# Patient Record
Sex: Male | Born: 1965 | Race: White | Hispanic: No | Marital: Married | Smoking: Never smoker
Health system: Southern US, Community
[De-identification: ages and names within clinical notes are randomized; demographics above are authoritative.]

## PROBLEM LIST (undated history)

## (undated) DIAGNOSIS — M1990 Unspecified osteoarthritis, unspecified site: Secondary | ICD-10-CM

## (undated) DIAGNOSIS — F32A Depression, unspecified: Secondary | ICD-10-CM

## (undated) DIAGNOSIS — N400 Enlarged prostate without lower urinary tract symptoms: Secondary | ICD-10-CM

## (undated) DIAGNOSIS — E785 Hyperlipidemia, unspecified: Secondary | ICD-10-CM

## (undated) DIAGNOSIS — F419 Anxiety disorder, unspecified: Secondary | ICD-10-CM

## (undated) DIAGNOSIS — E669 Obesity, unspecified: Secondary | ICD-10-CM

## (undated) DIAGNOSIS — G4730 Sleep apnea, unspecified: Secondary | ICD-10-CM

## (undated) DIAGNOSIS — E119 Type 2 diabetes mellitus without complications: Secondary | ICD-10-CM

## (undated) DIAGNOSIS — R011 Cardiac murmur, unspecified: Secondary | ICD-10-CM

## (undated) DIAGNOSIS — T884XXA Failed or difficult intubation, initial encounter: Secondary | ICD-10-CM

## (undated) DIAGNOSIS — E291 Testicular hypofunction: Secondary | ICD-10-CM

## (undated) DIAGNOSIS — R0789 Other chest pain: Secondary | ICD-10-CM

## (undated) DIAGNOSIS — F329 Major depressive disorder, single episode, unspecified: Secondary | ICD-10-CM

## (undated) DIAGNOSIS — I10 Essential (primary) hypertension: Secondary | ICD-10-CM

## (undated) DIAGNOSIS — G4700 Insomnia, unspecified: Secondary | ICD-10-CM

## (undated) HISTORY — DX: Insomnia, unspecified: G47.00

## (undated) HISTORY — DX: Type 2 diabetes mellitus without complications: E11.9

## (undated) HISTORY — DX: Testicular hypofunction: E29.1

## (undated) HISTORY — DX: Obesity, unspecified: E66.9

## (undated) HISTORY — DX: Benign prostatic hyperplasia without lower urinary tract symptoms: N40.0

## (undated) HISTORY — DX: Sleep apnea, unspecified: G47.30

## (undated) HISTORY — DX: Essential (primary) hypertension: I10

## (undated) HISTORY — DX: Other chest pain: R07.89

---

## 2000-04-08 HISTORY — PX: CHOLECYSTECTOMY: SHX55

## 2003-04-09 HISTORY — PX: OTHER SURGICAL HISTORY: SHX169

## 2003-07-08 HISTORY — PX: TONSILLECTOMY: SUR1361

## 2004-04-08 HISTORY — PX: APPENDECTOMY: SHX54

## 2004-05-25 ENCOUNTER — Inpatient Hospital Stay: Payer: Self-pay | Admitting: Surgery

## 2004-06-06 ENCOUNTER — Ambulatory Visit: Payer: Self-pay | Admitting: Surgery

## 2011-01-01 ENCOUNTER — Ambulatory Visit: Payer: Self-pay | Admitting: Unknown Physician Specialty

## 2011-01-07 ENCOUNTER — Ambulatory Visit: Payer: Self-pay | Admitting: Unknown Physician Specialty

## 2011-02-07 ENCOUNTER — Ambulatory Visit: Payer: Self-pay | Admitting: Unknown Physician Specialty

## 2011-03-09 ENCOUNTER — Ambulatory Visit: Payer: Self-pay | Admitting: Unknown Physician Specialty

## 2013-06-14 ENCOUNTER — Ambulatory Visit: Payer: Self-pay | Admitting: Family Medicine

## 2013-07-07 ENCOUNTER — Ambulatory Visit: Payer: Self-pay | Admitting: Family Medicine

## 2014-05-17 ENCOUNTER — Emergency Department: Payer: Self-pay | Admitting: Emergency Medicine

## 2014-12-05 ENCOUNTER — Encounter: Payer: Self-pay | Admitting: *Deleted

## 2014-12-14 ENCOUNTER — Ambulatory Visit: Payer: Self-pay | Admitting: Urology

## 2014-12-19 ENCOUNTER — Ambulatory Visit: Payer: Self-pay | Admitting: Urology

## 2014-12-26 ENCOUNTER — Ambulatory Visit (INDEPENDENT_AMBULATORY_CARE_PROVIDER_SITE_OTHER): Payer: BC Managed Care – PPO | Admitting: Urology

## 2014-12-26 ENCOUNTER — Encounter: Payer: Self-pay | Admitting: Urology

## 2014-12-26 VITALS — BP 137/81 | HR 98 | Resp 16 | Ht 69.0 in | Wt 221.5 lb

## 2014-12-26 DIAGNOSIS — N528 Other male erectile dysfunction: Secondary | ICD-10-CM | POA: Diagnosis not present

## 2014-12-26 DIAGNOSIS — N401 Enlarged prostate with lower urinary tract symptoms: Secondary | ICD-10-CM | POA: Diagnosis not present

## 2014-12-26 DIAGNOSIS — N138 Other obstructive and reflux uropathy: Secondary | ICD-10-CM

## 2014-12-26 DIAGNOSIS — N529 Male erectile dysfunction, unspecified: Secondary | ICD-10-CM

## 2014-12-26 DIAGNOSIS — E291 Testicular hypofunction: Secondary | ICD-10-CM | POA: Diagnosis not present

## 2014-12-26 MED ORDER — TESTOSTERONE CYPIONATE 200 MG/ML IM SOLN: 200.0000 mg | INTRAMUSCULAR | Status: DC

## 2014-12-26 NOTE — Progress Notes (Signed)
12/26/2014 8:58 AM   Micheal Silva 1966-03-15 [id number]  Referring provider: No referring provider defined for this encounter.  Chief Complaint  Patient presents with  . Benign Prostatic Hypertrophy    HPI: Patient is a 49 year old white male with BPH with LUTS and hypogonadism who presents today for his 6 month follow up.    BPH WITH LUTS His IPSS score today is 3, which is mild lower urinary tract symptomatology. He is pleased with his quality life due to his urinary symptoms. He denies any dysuria, hematuria or suprapubic pain.  He also denies any recent fevers, chills, nausea or vomiting.  He has a family history of PCa, with his father being diagnosed with prostate cancer.         IPSS      12/26/14 1500       International Prostate Symptom Score   How often have you had the sensation of not emptying your bladder? Not at All     How often have you had to urinate less than every two hours? Less than 1 in 5 times     How often have you found you stopped and started again several times when you urinated? Not at All     How often have you found it difficult to postpone urination? Less than 1 in 5 times     How often have you had a weak urinary stream? Not at All     How often have you had to strain to start urination? Not at All     How many times did you typically get up at night to urinate? 1 Time     Total IPSS Score 3     Quality of Life due to urinary symptoms   If you were to spend the rest of your life with your urinary condition just the way it is now how would you feel about that? Pleased        Score:  1-7 Mild 8-19 Moderate 20-35 Severe  Hypogonadism Patient complains of reduced libido, erectile dysfunction, an increase of body fat, a decrease in physical and work performance, decreased energy and motivation and mood changes.  This is indicated by his responses to the ADAM questionnaire.      Androgen Deficiency in the Aging Male      12/26/14 1500       Androgen Deficiency in the Aging Male   Do you have a decrease in libido (sex drive) Yes     Do you have lack of energy Yes     Do you have a decrease in strength and/or endurance Yes     Have you lost height No     Have you noticed a decreased "enjoyment of life" No     Are you sad and/or grumpy No     Are your erections less strong Yes     Have you noticed a recent deterioration in your ability to play sports Yes     Are you falling asleep after dinner No     Has there been a recent deterioration in your work performance No         His pretreatment testosterone level was 251 ng/dL on 07/31/2011.   * patient notified me that he has completed a residual treatment program for drug abuse and wanted all his providers to know in order to help him control his addiction and not give him pain meds.   PMH: Past Medical History  Diagnosis Date  . Sleep apnea   . Insomnia   . Diabetes mellitus   . Hypogonadism in male   . BPH (benign prostatic hyperplasia)   . HTN (hypertension)   . Obesity     Surgical History: Past Surgical History  Procedure Laterality Date  . Appendectomy  2006  . Corrective surgery for sleep apnea  2005  . Cholecystectomy      Home Medications:    Medication List       This list is accurate as of: 12/26/14 11:59 PM.  Always use your most recent med list.               aspirin 81 MG chewable tablet  Chew by mouth.     buPROPion 150 MG 24 hr tablet  Commonly known as:  WELLBUTRIN XL  TAKE 1 TABLET (150 MG TOTAL) BY MOUTH 2 (TWO) TIMES DAILY.     FLONASE 50 MCG/ACT nasal spray  Generic drug:  fluticasone  1 spray by Each Nare route daily.     losartan 50 MG tablet  Commonly known as:  COZAAR  Take 100 mg by mouth daily.     metFORMIN 1000 MG tablet  Commonly known as:  GLUCOPHAGE  TAKE 1 TABLET (1,000 MG TOTAL) BY MOUTH 2 (TWO) TIMES DAILY WITH MEALS.     naltrexone 50 MG tablet  Commonly known as:  DEPADE  Take by mouth.      saccharomyces boulardii 250 MG capsule  Commonly known as:  FLORASTOR  Take by mouth.     testosterone cypionate 200 MG/ML injection  Commonly known as:  DEPOTESTOSTERONE CYPIONATE  Inject 1 mL (200 mg total) into the muscle every 14 (fourteen) days.        Allergies:  Allergies  Allergen Reactions  . Vicodin [Hydrocodone-Acetaminophen]   . Atorvastatin     Other reaction(s): Other (See Comments) Leg cramp    Family History: Family History  Problem Relation Age of Onset  . Prostate cancer Father   . Diabetes Mellitus II    . Coronary artery disease    . Hypertension      Social History:  reports that he has never smoked. He does not have any smokeless tobacco history on file. He reports that he does not drink alcohol. His drug history is not on file.  ROS: UROLOGY Frequent Urination?: No Hard to postpone urination?: No Burning/pain with urination?: No Get up at night to urinate?: No Leakage of urine?: No Urine stream starts and stops?: No Trouble starting stream?: No Do you have to strain to urinate?: No Blood in urine?: No Urinary tract infection?: No Sexually transmitted disease?: No Injury to kidneys or bladder?: No Painful intercourse?: No Weak stream?: No Erection problems?: No Penile pain?: No  Gastrointestinal Nausea?: No Vomiting?: No Indigestion/heartburn?: No Diarrhea?: No Constipation?: No  Constitutional Fever: No Night sweats?: No Weight loss?: No Fatigue?: No  Skin Skin rash/lesions?: No Itching?: No  Eyes Blurred vision?: No Double vision?: No  Ears/Nose/Throat Sore throat?: No Sinus problems?: No  Hematologic/Lymphatic Swollen glands?: No Easy bruising?: No  Cardiovascular Leg swelling?: No Chest pain?: No  Respiratory Cough?: No Shortness of breath?: No  Endocrine Excessive thirst?: No  Musculoskeletal Back pain?: No Joint pain?: No  Neurological Headaches?: No Dizziness?: No  Psychologic Depression?:  No Anxiety?: No  Physical Exam: BP 137/81 mmHg  Pulse 98  Resp 16  Ht 5\' 9"  (1.753 m)  Wt 221 lb 8 oz (100.472 kg)  BMI 32.69 kg/m2  GU: Patient with circumcised phallus.  Urethral meatus is patent.  No penile discharge. No penile lesions or rashes. Scrotum without lesions, cysts, rashes and/or edema.  Testicles are located scrotally bilaterally. No masses are appreciated in the testicles. Left and right epididymis are normal. Rectal: Patient with  normal sphincter tone. Perineum without scarring or rashes. No rectal masses are appreciated. Prostate is approximately 45 grams, no nodules are appreciated. Seminal vesicles are normal.   Laboratory Data: Lab Results  Component Value Date   HCT 52.6* 12/26/2014   PSA History:  0.7 ng/mL on 07/20/2012  0.8 ng/mL on 05/31/2013  0.5 ng/mL on 11/24/2013  0.9 ng/mL on 05/27/2014  Lab Results  Component Value Date   PSA 0.8 12/26/2014    Assessment & Plan:    1. BPH (benign prostatic hyperplasia) with LUTS:   Patient's IPSS score is 3/1.   His DRE demonstrates benign enlargement.  We will continue observation over time with prn avoidance of alcohol/caffeine.  He will follow up in 6 months for a PSA, DRE, PVR and an IPSS.    - PSA  2. Hypogonadism:   Patient is currently managing his hypogonadism with depot testosterone cypionate injections, 200 mg/mL, 1cc IM weekly.  He will continue this dose until his am testosterone is available.  Once his lab result is available, we will adjust accordingly.  He has been without his medication for one month.   -Hematocrit  3. Erectile dysfunction:   Patient is having difficulty maintaining his erections.  He would like to try medication for this problem, which I think is appropriate.  I have given him Cialis 20 mg (#3) samples.  He will contact the office with the results.  He is warned not to take the Cialis with medications that contain nitrates.  I also advised him of the side effects, such as:  headache, flushing, dyspepsia, abnormal vision, nasal congestion, back pain, myalgia, nausea, dizziness, and rash.    Return for tomorrow in the am for testosterone draw .  Zara Council, [facility] [address] [employer] [place], [place] [address] [phone]

## 2014-12-27 ENCOUNTER — Other Ambulatory Visit: Payer: BC Managed Care – PPO

## 2014-12-27 DIAGNOSIS — N401 Enlarged prostate with lower urinary tract symptoms: Principal | ICD-10-CM

## 2014-12-27 DIAGNOSIS — E291 Testicular hypofunction: Secondary | ICD-10-CM | POA: Insufficient documentation

## 2014-12-27 DIAGNOSIS — N138 Other obstructive and reflux uropathy: Secondary | ICD-10-CM | POA: Insufficient documentation

## 2014-12-27 DIAGNOSIS — N529 Male erectile dysfunction, unspecified: Secondary | ICD-10-CM | POA: Insufficient documentation

## 2014-12-27 LAB — PSA: PROSTATE SPECIFIC AG, SERUM: 0.8 ng/mL (ref 0.0–4.0)

## 2014-12-27 LAB — HEMATOCRIT: Hematocrit: 52.6 % — ABNORMAL HIGH (ref 37.5–51.0)

## 2014-12-27 NOTE — Progress Notes (Signed)
Patient had testosterone drawn this morning that the order was attached to yesterdays visit.

## 2014-12-28 ENCOUNTER — Telehealth: Payer: Self-pay

## 2014-12-28 LAB — TESTOSTERONE: TESTOSTERONE: 57 ng/dL — AB (ref 348–1197)

## 2014-12-28 NOTE — Telephone Encounter (Signed)
-----   Message from Nori Riis, PA-C sent at 12/28/2014  9:03 AM EDT ----- Patient's testosterone is low, but he has been without his testosterone for one month.  His HCT is elevated, so he will have to hold the testosterone and have therapeutic phlebotomy.

## 2014-12-28 NOTE — Telephone Encounter (Signed)
LMOM

## 2014-12-29 NOTE — Telephone Encounter (Signed)
Spoke with pt in reference to labs. Made pt aware of HCT levels and made aware for the need of therapeutic phlebotomy. Pt stated he would see about a blood drive in [facility]. Reinforced the need for therapeutic phlebotomy. Pt voiced understanding stating he would call back if he can not find a blood drive.

## 2014-12-29 NOTE — Telephone Encounter (Signed)
-----   Message from Nori Riis, PA-C sent at 12/28/2014  9:03 AM EDT ----- Patient's testosterone is low, but he has been without his testosterone for one month.  His HCT is elevated, so he will have to hold the testosterone and have therapeutic phlebotomy.

## 2014-12-29 NOTE — Telephone Encounter (Signed)
LMOM

## 2015-01-02 ENCOUNTER — Telehealth: Payer: Self-pay

## 2015-01-02 NOTE — Telephone Encounter (Signed)
Pt called this morning stating he went Friday and gave blood. Pt stated he has to go see his PCP tomorrow and have blood work. Pt requested PCP to draw labs. Nurse reassured pt that would be ok but the results would need to be faxed to our office. Pt voiced understanding. Fax number was given. Reinforced with pt not to take anymore testosterone injections until we get hct and testosterone results due to elevated hct. Pt voiced understanding.  Larene Beach has been made aware of labs being drawn at PCP.

## 2015-03-27 ENCOUNTER — Telehealth: Payer: Self-pay

## 2015-03-27 DIAGNOSIS — E291 Testicular hypofunction: Secondary | ICD-10-CM

## 2015-03-27 NOTE — Telephone Encounter (Signed)
Pt called wanting refills on testosterone. Made pt aware he is going to need labs prior to receiving refills. Orders placed.

## 2015-03-29 ENCOUNTER — Encounter: Payer: Self-pay | Admitting: Family Medicine

## 2015-04-04 ENCOUNTER — Other Ambulatory Visit: Payer: BC Managed Care – PPO

## 2015-04-04 DIAGNOSIS — E291 Testicular hypofunction: Secondary | ICD-10-CM

## 2015-04-05 LAB — HEMATOCRIT: HEMATOCRIT: 47.1 % (ref 37.5–51.0)

## 2015-04-05 LAB — TESTOSTERONE: Testosterone: 70 ng/dL — ABNORMAL LOW (ref 348–1197)

## 2015-04-11 ENCOUNTER — Telehealth: Payer: Self-pay | Admitting: Urology

## 2015-04-11 DIAGNOSIS — E291 Testicular hypofunction: Secondary | ICD-10-CM

## 2015-04-11 NOTE — Telephone Encounter (Signed)
As of the first of the year, we are administering testosterone injections in the office and no longer having patient's inject themselves.  This is due to the increased FDA warnings concerning the potential for abuse of testosterone and the increase of potential heart attacks.  If he would like to reinstate the injections, he will need to do so through our office. If he finds this convenient, we can try other treatment modalities such as Clomid, topical applications of testosterone, nasal spray of testosterone, or the placement of testosterone pellets. He will need to have liver function tests performed prior to the initiation of any testosterone therapy.

## 2015-04-11 NOTE — Telephone Encounter (Signed)
Please advise 

## 2015-04-11 NOTE — Telephone Encounter (Signed)
Pt called for his lab results.  Please give him a call on cell.

## 2015-04-12 ENCOUNTER — Other Ambulatory Visit: Payer: Self-pay | Admitting: Urology

## 2015-04-12 DIAGNOSIS — Z79899 Other long term (current) drug therapy: Secondary | ICD-10-CM

## 2015-04-13 ENCOUNTER — Telehealth: Payer: Self-pay

## 2015-04-13 ENCOUNTER — Other Ambulatory Visit: Payer: BC Managed Care – PPO

## 2015-04-13 DIAGNOSIS — Z79899 Other long term (current) drug therapy: Secondary | ICD-10-CM

## 2015-04-13 NOTE — Telephone Encounter (Signed)
-----   Message from Nori Riis, PA-C sent at 04/12/2015 11:43 AM EST ----- Spoke with patient concerning his labs.  He is interested in starting Clomid.  He will have his PCP fax over his LFT's.

## 2015-04-13 NOTE — Telephone Encounter (Signed)
LMOM

## 2015-04-13 NOTE — Telephone Encounter (Signed)
Pt came in this morning and had LFTs drawn.

## 2015-04-14 ENCOUNTER — Telehealth: Payer: Self-pay

## 2015-04-14 ENCOUNTER — Other Ambulatory Visit: Payer: Self-pay | Admitting: Urology

## 2015-04-14 LAB — HEPATIC FUNCTION PANEL
ALBUMIN: 4.7 g/dL (ref 3.5–5.5)
ALK PHOS: 63 IU/L (ref 39–117)
ALT: 38 IU/L (ref 0–44)
AST: 26 IU/L (ref 0–40)
BILIRUBIN TOTAL: 0.3 mg/dL (ref 0.0–1.2)
BILIRUBIN, DIRECT: 0.09 mg/dL (ref 0.00–0.40)
TOTAL PROTEIN: 7.1 g/dL (ref 6.0–8.5)

## 2015-04-14 MED ORDER — CLOMIPHENE CITRATE 50 MG PO TABS: ORAL_TABLET | ORAL | Status: DC

## 2015-04-14 NOTE — Telephone Encounter (Signed)
-----   Message from Nori Riis, PA-C sent at 04/14/2015  8:23 AM EST ----- Please tell the patient that his liver function is normal and I have sent a prescription for the Clomid to his pharmacy. We will need him to come back in one month before 9 AM for a serum testosterone draw.

## 2015-04-14 NOTE — Telephone Encounter (Signed)
No answer

## 2015-04-14 NOTE — Telephone Encounter (Signed)
LMOM-labs normal. Medication sent to pharmacy. PA has been started for clomid.

## 2015-04-19 NOTE — Telephone Encounter (Signed)
Spoke with pt who stated he has already picked up clomid from pharmacy. Made pt aware we need a testosterone in 1 mo. Pt voiced understanding. Lab appt made and lab orders placed.

## 2015-04-20 ENCOUNTER — Telehealth: Payer: Self-pay

## 2015-04-20 NOTE — Telephone Encounter (Signed)
Pt PA for clomid has been approved for 04/14/15-04/23/16.

## 2015-05-09 ENCOUNTER — Telehealth: Payer: Self-pay | Admitting: Urology

## 2015-05-09 DIAGNOSIS — E291 Testicular hypofunction: Secondary | ICD-10-CM

## 2015-05-09 NOTE — Telephone Encounter (Signed)
Patient wants a return call back regarding his options for meds. He said he is very hard to get along with in so many words and that he really needs to know what else he can do??  He really needs to speak with someone as soon as he can.  Thanks  [employer]

## 2015-05-09 NOTE — Telephone Encounter (Signed)
My understanding is that he is taking the Clomid and he should of had a morning testosterone draw after a month of treatment.  I did not see that he returned for the blood work.  Am I correct?  If he has been taking the Clomid and hasn't had his blood work, I need a morning testosterone level.

## 2015-05-10 NOTE — Telephone Encounter (Signed)
LMOM

## 2015-05-11 NOTE — Telephone Encounter (Signed)
We can call in Androderm for the patient 4 gram/24 patch, #30 and check his testosterone in one month.

## 2015-05-11 NOTE — Telephone Encounter (Signed)
Pt returned call. Spoke with pt in reference to clomid. Pt states he is not able to tolerated clomid. Pt was interested in androderm. Please advise.

## 2015-05-11 NOTE — Telephone Encounter (Signed)
LMOM

## 2015-05-12 ENCOUNTER — Telehealth: Payer: Self-pay

## 2015-05-12 MED ORDER — TESTOSTERONE 4 MG/24HR TD PT24: 1.0000 | MEDICATED_PATCH | Freq: Every day | TRANSDERMAL | Status: DC

## 2015-05-12 NOTE — Telephone Encounter (Signed)
PA for androderm was approved. 05/12/15-05/11/2018. Ref#- [id number].

## 2015-05-12 NOTE — Telephone Encounter (Signed)
Spoke with pt in reference to androderm. Made aware a testosterone needs to be drawn in 50mo. Pt voiced understanding. Labs placed. Lab appt 06/09/15.

## 2015-05-19 ENCOUNTER — Other Ambulatory Visit: Payer: Self-pay

## 2015-06-09 ENCOUNTER — Other Ambulatory Visit: Payer: BC Managed Care – PPO

## 2015-06-09 DIAGNOSIS — E291 Testicular hypofunction: Secondary | ICD-10-CM

## 2015-06-10 LAB — TESTOSTERONE: TESTOSTERONE: 336 ng/dL — AB (ref 348–1197)

## 2015-06-12 ENCOUNTER — Telehealth: Payer: Self-pay

## 2015-06-12 DIAGNOSIS — E291 Testicular hypofunction: Secondary | ICD-10-CM

## 2015-06-12 NOTE — Telephone Encounter (Signed)
His testosterone is 336 which is almost within normal range. When Micheal Silva gets back she can discuss possible dosage adjustments with him.

## 2015-06-12 NOTE — Telephone Encounter (Signed)
Pt called requesting lab results. Please advise.  

## 2015-06-13 MED ORDER — CLOMIPHENE CITRATE 50 MG PO TABS: ORAL_TABLET | ORAL | Status: DC

## 2015-06-13 NOTE — Telephone Encounter (Signed)
Spoke with pt in reference to lab results. Pt voiced understanding.  

## 2015-06-15 ENCOUNTER — Other Ambulatory Visit: Payer: Self-pay

## 2015-06-15 DIAGNOSIS — E291 Testicular hypofunction: Secondary | ICD-10-CM

## 2015-06-15 MED ORDER — TESTOSTERONE 4 MG/24HR TD PT24: 1.0000 | MEDICATED_PATCH | Freq: Every day | TRANSDERMAL | Status: DC

## 2015-06-20 ENCOUNTER — Telehealth: Payer: Self-pay

## 2015-06-20 NOTE — Telephone Encounter (Signed)
-----   Message from Nori Riis, PA-C sent at 06/20/2015  1:44 PM EDT ----- I haven't seen the patient since September.  He needs an appointment.

## 2015-06-20 NOTE — Telephone Encounter (Signed)
LMOM- needs an office visit. Call back for appt.

## 2015-06-23 NOTE — Telephone Encounter (Signed)
LMOM

## 2015-06-26 NOTE — Telephone Encounter (Signed)
Left pt mess to call/SW 

## 2015-06-26 NOTE — Telephone Encounter (Signed)
-----   Message from Nori Riis, PA-C sent at 06/20/2015  1:44 PM EDT ----- I haven't seen the patient since September.  He needs an appointment.

## 2015-11-24 ENCOUNTER — Other Ambulatory Visit: Payer: Self-pay | Admitting: Family Medicine

## 2015-11-24 DIAGNOSIS — R1084 Generalized abdominal pain: Secondary | ICD-10-CM

## 2015-11-27 ENCOUNTER — Ambulatory Visit: Payer: BC Managed Care – PPO

## 2015-11-28 ENCOUNTER — Ambulatory Visit
Admission: RE | Admit: 2015-11-28 | Discharge: 2015-11-28 | Disposition: A | Discharge status: Home / Self Care | Payer: BC Managed Care – PPO | Source: Ambulatory Visit | Attending: Family Medicine | Admitting: Family Medicine

## 2015-11-28 DIAGNOSIS — N281 Cyst of kidney, acquired: Secondary | ICD-10-CM | POA: Insufficient documentation

## 2015-11-28 DIAGNOSIS — R1084 Generalized abdominal pain: Secondary | ICD-10-CM | POA: Diagnosis present

## 2015-12-25 ENCOUNTER — Encounter: Payer: Self-pay | Admitting: General Surgery

## 2015-12-25 ENCOUNTER — Ambulatory Visit (INDEPENDENT_AMBULATORY_CARE_PROVIDER_SITE_OTHER): Payer: BC Managed Care – PPO | Admitting: General Surgery

## 2015-12-25 VITALS — BP 136/74 | HR 89 | Resp 14 | Ht 66.0 in | Wt 217.0 lb

## 2015-12-25 DIAGNOSIS — M546 Pain in thoracic spine: Secondary | ICD-10-CM | POA: Diagnosis not present

## 2015-12-25 DIAGNOSIS — R0789 Other chest pain: Secondary | ICD-10-CM

## 2015-12-25 DIAGNOSIS — R1011 Right upper quadrant pain: Secondary | ICD-10-CM

## 2015-12-25 DIAGNOSIS — R1010 Upper abdominal pain, unspecified: Secondary | ICD-10-CM

## 2015-12-25 DIAGNOSIS — G8929 Other chronic pain: Secondary | ICD-10-CM | POA: Insufficient documentation

## 2015-12-25 HISTORY — DX: Other chest pain: R07.89

## 2015-12-25 MED ORDER — POLYETHYLENE GLYCOL 3350 17 GM/SCOOP PO POWD: 1.0000 | Freq: Once | ORAL | 0 refills | Status: AC

## 2015-12-25 NOTE — Patient Instructions (Addendum)
Colonoscopy A colonoscopy is an exam to look at the entire large intestine (colon). This exam can help find problems such as tumors, polyps, inflammation, and areas of bleeding. The exam takes about 1 hour.  LET [facility] CARE PROVIDER KNOW ABOUT:   Any allergies you have.  All medicines you are taking, including vitamins, herbs, eye drops, creams, and over-the-counter medicines.  Previous problems you or members of your family have had with the use of anesthetics.  Any blood disorders you have.  Previous surgeries you have had.  Medical conditions you have. RISKS AND COMPLICATIONS  Generally, this is a safe procedure. However, as with any procedure, complications can occur. Possible complications include:  Bleeding.  Tearing or rupture of the colon wall.  Reaction to medicines given during the exam.  Infection (rare). BEFORE THE PROCEDURE   Ask your health care provider about changing or stopping your regular medicines.  You may be prescribed an oral bowel prep. This involves drinking a large amount of medicated liquid, starting the day before your procedure. The liquid will cause you to have multiple loose stools until your stool is almost clear or light green. This cleans out your colon in preparation for the procedure.  Do not eat or drink anything else once you have started the bowel prep, unless your health care provider tells you it is safe to do so.  Arrange for someone to drive you home after the procedure. PROCEDURE   You will be given medicine to help you relax (sedative).  You will lie on your side with your knees bent.  A long, flexible tube with a light and camera on the end (colonoscope) will be inserted through the rectum and into the colon. The camera sends video back to a computer screen as it moves through the colon. The colonoscope also releases carbon dioxide gas to inflate the colon. This helps your health care provider see the area better.  During  the exam, your health care provider may take a small tissue sample (biopsy) to be examined under a microscope if any abnormalities are found.  The exam is finished when the entire colon has been viewed. AFTER THE PROCEDURE   Do not drive for 24 hours after the exam.  You may have a small amount of blood in your stool.  You may pass moderate amounts of gas and have mild abdominal cramping or bloating. This is caused by the gas used to inflate your colon during the exam.  Ask when your test results will be ready and how you will get your results. Make sure you get your test results.   This information is not intended to replace advice given to you by your health care provider. Make sure you discuss any questions you have with your health care provider.   Document Released: 03/22/2000 Document Revised: 01/13/2013 Document Reviewed: 11/30/2012 Elsevier Interactive Patient Education 2016 [place].   Scheduled for MRI  The patient is scheduled for a Colonoscopy and upper endoscopy at [facility] on 01/01/16. They are aware to call the day before to get their arrival time. He will hold his Metformin the day of prep and procedure. The patient will only take his blood pressure medications the morning of procedure with a small sip of water. Miralax prescription has been sent into the patient's pharmacy. The patient is aware of date and instructions.  The patient is scheduled for a Thoracic MRI at [facility] on 12/29/15. He will arrive by 9:30 am at the registration  desk at the [facility]. The patient is aware of date, time, and instructions.

## 2015-12-25 NOTE — Progress Notes (Signed)
Patient ID: Micheal Silva, male   DOB: 01-04-1966, 50 y.o.   MRN: [id number]  Chief Complaint  Patient presents with  . Abdominal Pain    HPI Micheal Silva is a 50 y.o. male here today for a evaluation of abdominal pain. He states the pain is located in his upper right abdomen the pain extends to his back.   The pain has been going for about 3 months. He had a ultrasound done on 11/28/15 and a CT done on 12/13/15 at [facility]. Patient states he has been having a hard time sleeping and eating. No foods trigger the pain. He can tolerate bland foods and crackers. He has been having a lot of loose stools 2 or 3 times a day, no change in colorFor the last 2 weeks. Change in position make things worst. He does have nausea constantly throughout the day.He has lost 15 pounds in the last two months.  HPI  Past Medical History:  Diagnosis Date  . BPH (benign prostatic hyperplasia)   . Diabetes mellitus ([place])   . HTN (hypertension)   . Hypogonadism in male   . Insomnia   . Obesity   . Right-sided chest wall pain 12/25/2015  . Sleep apnea     Past Surgical History:  Procedure Laterality Date  . APPENDECTOMY  2006   Dr. Tamala Julian  . CHOLECYSTECTOMY  2002   [facility]-Dr. Tamala Julian  . corrective surgery for sleep apnea  2005  . TONSILLECTOMY  07/2003   palate also removed    Family History  Problem Relation Age of Onset  . Prostate cancer Father   . Leukemia Father   . Diabetes Mellitus II    . Coronary artery disease    . Hypertension    . Breast cancer Mother     Social History Social History  Substance Use Topics  . Smoking status: Never Smoker  . Smokeless tobacco: Not on file  . Alcohol use No    Allergies  Allergen Reactions  . Vicodin [Hydrocodone-Acetaminophen]   . Atorvastatin     Other reaction(s): Other (See Comments) Leg cramp    Current Outpatient Prescriptions  Medication Sig Dispense Refill  . aspirin 81 MG chewable tablet Chew by mouth.    Marland Kitchen buPROPion (WELLBUTRIN XL) 150 MG 24  hr tablet take 300 mg in am and take 150 mg in evening  9  . cloNIDine (CATAPRES) 0.1 MG tablet Take 0.1 mg by mouth 2 (two) times daily.    Marland Kitchen gabapentin (NEURONTIN) 300 MG capsule Take 300 mg by mouth 3 (three) times daily.    Marland Kitchen losartan (COZAAR) 50 MG tablet Take 100 mg by mouth daily.     . metFORMIN (GLUCOPHAGE) 1000 MG tablet TAKE 1 TABLET (1,000 MG TOTAL) BY MOUTH 2 (TWO) TIMES DAILY WITH MEALS.  11  . naltrexone (DEPADE) 50 MG tablet Take by mouth.    . saccharomyces boulardii (FLORASTOR) 250 MG capsule Take by mouth.    . testosterone cypionate (DEPOTESTOSTERONE CYPIONATE) 200 MG/ML injection Inject 1 mL (200 mg total) into the muscle every 14 (fourteen) days. 10 mL 0  . fluticasone (FLONASE) 50 MCG/ACT nasal spray 1 spray by Each Nare route daily.    . polyethylene glycol powder (GLYCOLAX/MIRALAX) powder Take 255 g by mouth once. 255 g 0   No current facility-administered medications for this visit.     Review of Systems Review of Systems  Constitutional: Negative.   Respiratory: Negative.   Cardiovascular: Negative.   Gastrointestinal:  Positive for abdominal pain and nausea.    Blood pressure 136/74, pulse 89, resp. rate 14, height 5\' 6"  (1.676 m), weight 217 lb (98.4 kg).  Physical Exam Physical Exam  Constitutional: He is oriented to person, place, and time. He appears well-developed and well-nourished.  Eyes: Conjunctivae are normal. No scleral icterus.  Neck: Neck supple.  Cardiovascular: Normal rate, regular rhythm and normal heart sounds.   Pulses:      Dorsalis pedis pulses are 2+ on the right side, and 2+ on the left side.       Posterior tibial pulses are 2+ on the right side, and 2+ on the left side.  Pulmonary/Chest: Effort normal and breath sounds normal.  Abdominal: Soft. Normal appearance and bowel sounds are normal. There is no hepatomegaly. There is tenderness in the right upper quadrant.    Musculoskeletal:       Back:  Lymphadenopathy:    He has no  cervical adenopathy.  Neurological: He is alert and oriented to person, place, and time.  Skin: Skin is warm and dry.       Data Reviewed 12/06/2015 urology notes from Edrick Oh, M.D. reviewed.  PCP notes of 11/17/2015 reviewed.  Abdominal ultrasound dated 11/28/2015 reviewed. Normal common bile duct. Her graft CT of the abdomen and pelvis completed at [facility] 12/13/2015 was independently reviewed. The clear sedation of the bowel contents in the distal ileum reported. Laboratory studies dated 11/02/2015 showed normal CBC with a hemoglobin of 15.5 with an MCV of 88, white blood cell count of 7800. Normal differential. Platelet count 196,000.  Lyme disease testing negative. Sedimentation rate: 1. Normal C-reactive protein.  Conference metabolic panel dated 123456 was notable for a modest elevation of the blood sugar 168. Electrolytes. Normal liver function studies.  Assessment    Long-standing right upper quadrant/right lower chest pain. Her etiology based on imaging to date.      Plan    With the pain on percussion of the right paraspinal area even though there is no radiation anterior abdomen and MRI would be appropriate and firm no pathologic process. With his reported increasing symptoms after meals and the report of fecal station of stool in the distal ileum, upper and lower endoscopy is been recommended.    Patient to have a thoracic MRI done .   Colonoscopy and upper endoscopy with possible biopsy/polypectomy prn: Information regarding the procedure, including its potential risks and complications (including but not limited to perforation of the bowel, which may require emergency surgery to repair, and bleeding) was verbally given to the patient. Educational information regarding lower intestinal endoscopy was given to the patient. Written instructions for how to complete the bowel prep using Miralax were provided. The importance of drinking ample fluids to avoid  dehydration as a result of the prep emphasized.  The patient is scheduled for a Colonoscopy and upper endoscopy at [facility] on 01/01/16. They are aware to call the day before to get their arrival time. He will hold his Metformin the day of prep and procedure. The patient will only take his blood pressure medications the morning of procedure with a small sip of water. Miralax prescription has been sent into the patient's pharmacy. The patient is aware of date and instructions.  The patient is scheduled for a Thoracic MRI at [facility] on 12/29/15. He will arrive by 9:30 am at the registration desk at the [facility]. The patient is aware of date, time, and instructions.   This information has been scribed  by Gaspar Cola CMA.    [name] 12/25/2015, 8:39 PM

## 2015-12-27 ENCOUNTER — Ambulatory Visit: Payer: Self-pay | Admitting: General Surgery

## 2015-12-29 ENCOUNTER — Encounter: Payer: Self-pay | Admitting: *Deleted

## 2015-12-29 ENCOUNTER — Ambulatory Visit
Admission: RE | Admit: 2015-12-29 | Discharge: 2015-12-29 | Disposition: A | Discharge status: Home / Self Care | Payer: BC Managed Care – PPO | Source: Ambulatory Visit | Attending: General Surgery | Admitting: General Surgery

## 2015-12-29 DIAGNOSIS — R1011 Right upper quadrant pain: Secondary | ICD-10-CM | POA: Diagnosis not present

## 2015-12-29 DIAGNOSIS — M546 Pain in thoracic spine: Secondary | ICD-10-CM | POA: Insufficient documentation

## 2015-12-29 MED ORDER — GADOBENATE DIMEGLUMINE 529 MG/ML IV SOLN
20.0000 mL | Freq: Once | INTRAVENOUS | Status: AC | PRN
  Administered 2015-12-29: 20 mL via INTRAVENOUS

## 2016-01-01 ENCOUNTER — Ambulatory Visit: Payer: BC Managed Care – PPO | Admitting: Anesthesiology

## 2016-01-01 ENCOUNTER — Encounter: Payer: Self-pay | Admitting: *Deleted

## 2016-01-01 ENCOUNTER — Encounter
Admission: RE | Disposition: A | Discharge status: Home / Self Care | Payer: Self-pay | Source: Ambulatory Visit | Attending: General Surgery

## 2016-01-01 ENCOUNTER — Ambulatory Visit
Admission: RE | Admit: 2016-01-01 | Discharge: 2016-01-01 | Disposition: A | Discharge status: Home / Self Care | Payer: BC Managed Care – PPO | Source: Ambulatory Visit | Attending: General Surgery | Admitting: General Surgery

## 2016-01-01 DIAGNOSIS — Z888 Allergy status to other drugs, medicaments and biological substances status: Secondary | ICD-10-CM | POA: Diagnosis not present

## 2016-01-01 DIAGNOSIS — D125 Benign neoplasm of sigmoid colon: Secondary | ICD-10-CM | POA: Diagnosis not present

## 2016-01-01 DIAGNOSIS — Z79899 Other long term (current) drug therapy: Secondary | ICD-10-CM | POA: Diagnosis not present

## 2016-01-01 DIAGNOSIS — K317 Polyp of stomach and duodenum: Secondary | ICD-10-CM

## 2016-01-01 DIAGNOSIS — R1011 Right upper quadrant pain: Secondary | ICD-10-CM | POA: Diagnosis not present

## 2016-01-01 DIAGNOSIS — D123 Benign neoplasm of transverse colon: Secondary | ICD-10-CM | POA: Insufficient documentation

## 2016-01-01 DIAGNOSIS — R109 Unspecified abdominal pain: Secondary | ICD-10-CM | POA: Diagnosis present

## 2016-01-01 DIAGNOSIS — D122 Benign neoplasm of ascending colon: Secondary | ICD-10-CM | POA: Insufficient documentation

## 2016-01-01 DIAGNOSIS — Z885 Allergy status to narcotic agent status: Secondary | ICD-10-CM | POA: Insufficient documentation

## 2016-01-01 DIAGNOSIS — Z7984 Long term (current) use of oral hypoglycemic drugs: Secondary | ICD-10-CM | POA: Diagnosis not present

## 2016-01-01 DIAGNOSIS — Z7982 Long term (current) use of aspirin: Secondary | ICD-10-CM | POA: Insufficient documentation

## 2016-01-01 DIAGNOSIS — E669 Obesity, unspecified: Secondary | ICD-10-CM | POA: Diagnosis not present

## 2016-01-01 DIAGNOSIS — G4730 Sleep apnea, unspecified: Secondary | ICD-10-CM | POA: Diagnosis not present

## 2016-01-01 DIAGNOSIS — K2950 Unspecified chronic gastritis without bleeding: Secondary | ICD-10-CM | POA: Diagnosis not present

## 2016-01-01 DIAGNOSIS — N400 Enlarged prostate without lower urinary tract symptoms: Secondary | ICD-10-CM | POA: Insufficient documentation

## 2016-01-01 DIAGNOSIS — I10 Essential (primary) hypertension: Secondary | ICD-10-CM | POA: Diagnosis not present

## 2016-01-01 DIAGNOSIS — G4700 Insomnia, unspecified: Secondary | ICD-10-CM | POA: Insufficient documentation

## 2016-01-01 DIAGNOSIS — G8929 Other chronic pain: Secondary | ICD-10-CM

## 2016-01-01 DIAGNOSIS — Z9049 Acquired absence of other specified parts of digestive tract: Secondary | ICD-10-CM | POA: Diagnosis not present

## 2016-01-01 DIAGNOSIS — E119 Type 2 diabetes mellitus without complications: Secondary | ICD-10-CM | POA: Diagnosis not present

## 2016-01-01 HISTORY — PX: COLONOSCOPY WITH PROPOFOL: SHX5780

## 2016-01-01 HISTORY — PX: ESOPHAGOGASTRODUODENOSCOPY (EGD) WITH PROPOFOL: SHX5813

## 2016-01-01 HISTORY — PX: COLONOSCOPY: SHX174

## 2016-01-01 LAB — GLUCOSE, CAPILLARY: GLUCOSE-CAPILLARY: 155 mg/dL — AB (ref 65–99)

## 2016-01-01 SURGERY — ESOPHAGOGASTRODUODENOSCOPY (EGD) WITH PROPOFOL
Anesthesia: General

## 2016-01-01 MED ORDER — PHENYLEPHRINE HCL 10 MG/ML IJ SOLN
INTRAMUSCULAR | Status: DC | PRN
  Administered 2016-01-01: 100 ug via INTRAVENOUS
  Administered 2016-01-01: 200 ug via INTRAVENOUS

## 2016-01-01 MED ORDER — FENTANYL CITRATE (PF) 100 MCG/2ML IJ SOLN
INTRAMUSCULAR | Status: DC | PRN
  Administered 2016-01-01: 50 ug via INTRAVENOUS

## 2016-01-01 MED ORDER — PROPOFOL 10 MG/ML IV BOLUS
INTRAVENOUS | Status: DC | PRN
  Administered 2016-01-01: 140 mg via INTRAVENOUS

## 2016-01-01 MED ORDER — MIDAZOLAM HCL 2 MG/2ML IJ SOLN
INTRAMUSCULAR | Status: DC | PRN
  Administered 2016-01-01: 1 mg via INTRAVENOUS

## 2016-01-01 MED ORDER — SODIUM CHLORIDE 0.9 % IV SOLN
INTRAVENOUS | Status: DC
  Administered 2016-01-01: 1000 mL via INTRAVENOUS

## 2016-01-01 MED ORDER — GLYCOPYRROLATE 0.2 MG/ML IJ SOLN
INTRAMUSCULAR | Status: DC | PRN
  Administered 2016-01-01: 0.2 mg via INTRAVENOUS

## 2016-01-01 MED ORDER — EPHEDRINE SULFATE 50 MG/ML IJ SOLN
INTRAMUSCULAR | Status: DC | PRN
  Administered 2016-01-01: 7.5 mg via INTRAVENOUS
  Administered 2016-01-01 (×3): 10 mg via INTRAVENOUS
  Administered 2016-01-01: 7.5 mg via INTRAVENOUS

## 2016-01-01 MED ORDER — PROPOFOL 500 MG/50ML IV EMUL
INTRAVENOUS | Status: DC | PRN
  Administered 2016-01-01: 180 ug/kg/min via INTRAVENOUS

## 2016-01-01 NOTE — Op Note (Signed)
[facility] Gastroenterology Patient Name: Micheal Silva Procedure Date: 01/01/2016 11:40 AM MRN: [id number] Account #: [id number] Date of Birth: 24-Jun-1965 Admit Type: Outpatient Age: 50 Room: [facility] ENDO ROOM 1 Gender: Male Note Status: Finalized Procedure:            Colonoscopy Indications:          Abdominal pain in the right upper quadrant Providers:            [name], MD Referring MD:         Dani Gobble. Dema Severin, MD (Referring MD) Medicines:            Monitored Anesthesia Care Complications:        No immediate complications. Procedure:            Pre-Anesthesia Assessment:                       - Prior to the procedure, a History and Physical was                        performed, and patient medications, allergies and                        sensitivities were reviewed. The patient's tolerance of                        previous anesthesia was reviewed.                       - The risks and benefits of the procedure and the                        sedation options and risks were discussed with the                        patient. All questions were answered and informed                        consent was obtained.                       After obtaining informed consent, the colonoscope was                        passed under direct vision. Throughout the procedure,                        the patient's blood pressure, pulse, and oxygen                        saturations were monitored continuously. The                        Colonoscope was introduced through the anus and                        advanced to the the cecum, identified by appendiceal                        orifice and ileocecal valve. The colonoscopy was  somewhat difficult due to a tortuous colon. Successful                        completion of the procedure was aided by using manual                        pressure. The patient tolerated the procedure well. The                  quality of the bowel preparation was excellent. Findings:      Three semi-pedunculated polyps were found in the splenic flexure and       ascending colon. The polyps were 12 to 25 mm in size. These polyps were       removed with a hot snare. Resection and retrieval were complete.      The retroflexed view of the distal rectum and anal verge was normal and       showed no anal or rectal abnormalities. Impression:           - Three 12 to 25 mm polyps at the splenic flexure and                        in the ascending colon, removed with a hot snare.                        Resected and retrieved.                       - The distal rectum and anal verge are normal on                        retroflexion view. Recommendation:       - Telephone endoscopist for pathology results in 1 week.                       - Return to endoscopist in 1 week. Procedure Code(s):    --- Professional ---                       [phone], Colonoscopy, flexible; with removal of tumor(s),                        polyp(s), or other lesion(s) by snare technique Diagnosis Code(s):    --- Professional ---                       D12.3, Benign neoplasm of transverse colon (hepatic                        flexure or splenic flexure)                       D12.2, Benign neoplasm of ascending colon                       R10.11, Right upper quadrant pain CPT copyright 2016 American Medical Association. All rights reserved. The codes documented in this report are preliminary and upon coder review may  be revised to meet current compliance requirements. [name], MD 01/01/2016 12:36:41 PM This report has been signed electronically. Number of Addenda: 0 Note Initiated On: 01/01/2016 11:40 AM Scope  Withdrawal Time: 0 hours 14 minutes 47 seconds  Total Procedure Duration: 0 hours 34 minutes 11 seconds       [facility]

## 2016-01-01 NOTE — Transfer of Care (Signed)
Immediate Anesthesia Transfer of Care Note  Patient: YOGESH SMOKER  Procedure(s) Performed: Procedure(s): ESOPHAGOGASTRODUODENOSCOPY (EGD) WITH PROPOFOL (N/A) COLONOSCOPY WITH PROPOFOL (N/A)  Patient Location: PACU and Endoscopy Unit  Anesthesia Type:General  Level of Consciousness: awake, alert  and oriented  Airway & Oxygen Therapy: Patient Spontanous Breathing and Patient connected to nasal cannula oxygen  Post-op Assessment: Report given to RN and Post -op Vital signs reviewed and stable  Post vital signs: Reviewed and stable  Last Vitals:  Vitals:   01/01/16 1010 01/01/16 1240  BP: 131/82 (!) (P) 101/52  Pulse: 80 (P) 82  Resp: 17 (!) (P) 21  Temp: 37.4 C (!) 35.9 C    Last Pain:  Vitals:   01/01/16 1240  TempSrc: Tympanic      Patients Stated Pain Goal: 7 ([id number])  Complications: No apparent anesthesia complications

## 2016-01-01 NOTE — Op Note (Signed)
[facility] Gastroenterology Patient Name: Micheal Silva Procedure Date: 01/01/2016 11:41 AM MRN: [id number] Account #: [id number] Date of Birth: May 19, 1965 Admit Type: Outpatient Age: 50 Room: [facility] ENDO ROOM 1 Gender: Male Note Status: Finalized Procedure:            Upper GI endoscopy Indications:          Abdominal pain in the right upper quadrant Providers:            [name], MD Referring MD:         Dani Gobble. Dema Severin, MD (Referring MD) Medicines:            Monitored Anesthesia Care Complications:        No immediate complications. Procedure:            Pre-Anesthesia Assessment:                       - Prior to the procedure, a History and Physical was                        performed, and patient medications, allergies and                        sensitivities were reviewed. The patient's tolerance of                        previous anesthesia was reviewed.                       - The risks and benefits of the procedure and the                        sedation options and risks were discussed with the                        patient. All questions were answered and informed                        consent was obtained.                       After obtaining informed consent, the endoscope was                        passed under direct vision. Throughout the procedure,                        the patient's blood pressure, pulse, and oxygen                        saturations were monitored continuously. The Endoscope                        was introduced through the mouth, and advanced to the                        second part of duodenum. The upper GI endoscopy was                        accomplished without difficulty. The patient tolerated  the procedure well. Findings:      The esophagus was normal.      A single 4 mm sessile polyp with no bleeding and no stigmata of recent       bleeding was found in the gastric fundus.  Biopsies were taken with a       cold forceps for histology.      Localized moderate inflammation characterized by erythema and       granularity was found in the prepyloric region of the stomach. This was       biopsied with a cold forceps for histology.      The examined duodenum was normal. Impression:           - Normal esophagus.                       - A single gastric polyp. Biopsied.                       - Chronic gastritis. Biopsied.                       - Normal examined duodenum. Recommendation:       - Perform a colonoscopy today. Procedure Code(s):    --- Professional ---                       [phone], Esophagogastroduodenoscopy, flexible, transoral;                        with biopsy, single or multiple Diagnosis Code(s):    --- Professional ---                       R10.11, Right upper quadrant pain                       K29.50, Unspecified chronic gastritis without bleeding                       K31.7, Polyp of stomach and duodenum CPT copyright 2016 American Medical Association. All rights reserved. The codes documented in this report are preliminary and upon coder review may  be revised to meet current compliance requirements. [name], MD 01/01/2016 11:56:49 AM This report has been signed electronically. Number of Addenda: 0 Note Initiated On: 01/01/2016 11:41 AM      [facility]

## 2016-01-01 NOTE — H&P (Addendum)
Micheal Silva [id number] December 31, 1965     HPI:  No change in clinical history or exam.  The patient reports he had increased upper abdominal pain (right) where he has been symptomatic since starting the prep for his colonoscopy.  MRI of thoracic spine was normal.   Prescriptions Prior to Admission  Medication Sig Dispense Refill Last Dose  . aspirin 81 MG chewable tablet Chew by mouth.   Past Week at Unknown time  . buPROPion (WELLBUTRIN XL) 150 MG 24 hr tablet take 300 mg in am and take 150 mg in evening  9 Past Week at Unknown time  . cloNIDine (CATAPRES) 0.1 MG tablet Take 0.1 mg by mouth 2 (two) times daily.   Past Week at Unknown time  . gabapentin (NEURONTIN) 300 MG capsule Take 300 mg by mouth 3 (three) times daily.   Past Week at Unknown time  . losartan (COZAAR) 50 MG tablet Take 100 mg by mouth daily.    Past Week at Unknown time  . metFORMIN (GLUCOPHAGE) 1000 MG tablet TAKE 1 TABLET (1,000 MG TOTAL) BY MOUTH 2 (TWO) TIMES DAILY WITH MEALS.  11 Past Week at Unknown time  . naltrexone (DEPADE) 50 MG tablet Take by mouth.   Past Week at Unknown time  . saccharomyces boulardii (FLORASTOR) 250 MG capsule Take by mouth.   Past Week at Unknown time  . testosterone cypionate (DEPOTESTOSTERONE CYPIONATE) 200 MG/ML injection Inject 1 mL (200 mg total) into the muscle every 14 (fourteen) days. 10 mL 0 Past Week at Unknown time  . fluticasone (FLONASE) 50 MCG/ACT nasal spray 1 spray by Each Nare route daily.   Taking   Allergies  Allergen Reactions  . Vicodin [Hydrocodone-Acetaminophen]   . Atorvastatin     Other reaction(s): Other (See Comments) Leg cramp   Past Medical History:  Diagnosis Date  . BPH (benign prostatic hyperplasia)   . Diabetes mellitus ([place])   . HTN (hypertension)   . Hypogonadism in male   . Insomnia   . Obesity   . Right-sided chest wall pain 12/25/2015  . Sleep apnea    Past Surgical History:  Procedure Laterality Date  . APPENDECTOMY  2006   Dr. Tamala Julian  .  CHOLECYSTECTOMY  2002   [facility]-Dr. Tamala Julian  . corrective surgery for sleep apnea  2005  . TONSILLECTOMY  07/2003   palate also removed   Social History   Social History  . Marital status: Married    Spouse name: N/A  . Number of children: N/A  . Years of education: N/A   Occupational History  . Not on file.   Social History Main Topics  . Smoking status: Never Smoker  . Smokeless tobacco: Not on file  . Alcohol use No  . Drug use:   . Sexual activity: Not on file   Other Topics Concern  . Not on file   Social History Narrative  . No narrative on file   Social History   Social History Narrative  . No narrative on file     ROS: Negative.     PE: HEENT: Negative. Lungs: Clear. Cardio: RR.  Assessment/Plan:  Proceed with planned endoscopy.    Micheal Silva 01/01/2016

## 2016-01-01 NOTE — Anesthesia Postprocedure Evaluation (Signed)
Anesthesia Post Note  Patient: BIKRAM ZUBAL  Procedure(s) Performed: Procedure(s) (LRB): ESOPHAGOGASTRODUODENOSCOPY (EGD) WITH PROPOFOL (N/A) COLONOSCOPY WITH PROPOFOL (N/A)  Patient location during evaluation: Endoscopy Anesthesia Type: General Level of consciousness: awake and alert and oriented Pain management: pain level controlled Vital Signs Assessment: post-procedure vital signs reviewed and stable Respiratory status: spontaneous breathing, nonlabored ventilation and respiratory function stable Cardiovascular status: blood pressure returned to baseline and stable Postop Assessment: no signs of nausea or vomiting Anesthetic complications: no    Last Vitals:  Vitals:   01/01/16 1300 01/01/16 1310  BP: 121/81 127/87  Pulse: 80 62  Resp: 20 15  Temp:      Last Pain:  Vitals:   01/01/16 1240  TempSrc: Tympanic  PainSc: Asleep                 Sukhraj Esquivias

## 2016-01-01 NOTE — Anesthesia Preprocedure Evaluation (Signed)
Anesthesia Evaluation  Patient identified by MRN, date of birth, ID band Patient awake    Reviewed: Allergy & Precautions, NPO status , Patient's Chart, lab work & pertinent test results  History of Anesthesia Complications Negative for: history of anesthetic complications  Airway Mallampati: II  TM Distance: >3 FB Neck ROM: Full    Dental no notable dental hx.    Pulmonary sleep apnea , neg COPD,    breath sounds clear to auscultation- rhonchi (-) wheezing      Cardiovascular Exercise Tolerance: Good hypertension, Pt. on medications (-) CAD and (-) Past MI  Rhythm:Regular Rate:Normal - Systolic murmurs and - Diastolic murmurs Echo stress test 10/25/15: NORMAL STRESS TEST. NORMAL RESTING STUDY WITH NO WALL MOTION ABNORMALITIES  AT REST AND PEAK STRESS.  VALVULAR REGURGITATION: TRIVIAL MR, TRIVIAL TR  NO VALVULAR STENOSIS  Maximum workload of 13.40 METs was achieved during exercise.  NORMAL RESTING BP - APPROPRIATE RESPONSE   Neuro/Psych negative neurological ROS  negative psych ROS   GI/Hepatic negative GI ROS,   Endo/Other  diabetes, Type 2, Oral Hypoglycemic Agents  Renal/GU negative Renal ROS     Musculoskeletal negative musculoskeletal ROS (+)   Abdominal (+) + obese,   Peds  Hematology negative hematology ROS (+)   Anesthesia Other Findings Past Medical History: No date: BPH (benign prostatic hyperplasia) No date: Diabetes mellitus ([facility]) No date: HTN (hypertension) No date: Hypogonadism in male No date: Insomnia No date: Obesity 12/25/2015: Right-sided chest wall pain No date: Sleep apnea   Reproductive/Obstetrics                             Anesthesia Physical Anesthesia Plan  ASA: III  Anesthesia Plan: General   Post-op Pain Management:    Induction: Intravenous  Airway Management Planned: Natural Airway  Additional Equipment:   Intra-op Plan:   Post-operative  Plan: Extubation in OR  Informed Consent: I have reviewed the patients History and Physical, chart, labs and discussed the procedure including the risks, benefits and alternatives for the proposed anesthesia with the patient or authorized representative who has indicated his/her understanding and acceptance.   Dental advisory given  Plan Discussed with: Anesthesiologist and CRNA  Anesthesia Plan Comments:         Anesthesia Quick Evaluation

## 2016-01-02 LAB — SURGICAL PATHOLOGY

## 2016-01-03 ENCOUNTER — Telehealth: Payer: Self-pay | Admitting: *Deleted

## 2016-01-03 NOTE — Telephone Encounter (Signed)
-----   Message from [name], MD sent at 01/03/2016  9:54 AM EDT ----- Please notify the patient that his polyps did not show any cancer, but I would like to arrange a sitdown visit in one week to review his abdominal symptoms and plans for follow-up colonoscopy. ----- Message ----- From: Interface, Lab In Three Zero One Sent: 01/02/2016  12:02 PM To: [name], MD

## 2016-01-03 NOTE — Telephone Encounter (Signed)
Notified patient as instructed, verbalized understanding. Made follow up appointment for 01/10/16.

## 2016-01-08 ENCOUNTER — Encounter: Payer: Self-pay | Admitting: General Surgery

## 2016-01-08 ENCOUNTER — Ambulatory Visit (INDEPENDENT_AMBULATORY_CARE_PROVIDER_SITE_OTHER): Payer: BC Managed Care – PPO | Admitting: General Surgery

## 2016-01-08 VITALS — BP 128/74 | HR 76 | Resp 12 | Ht 66.0 in | Wt 224.0 lb

## 2016-01-08 DIAGNOSIS — G8929 Other chronic pain: Secondary | ICD-10-CM | POA: Diagnosis not present

## 2016-01-08 DIAGNOSIS — R1011 Right upper quadrant pain: Secondary | ICD-10-CM

## 2016-01-08 NOTE — Patient Instructions (Addendum)
Patient to have a colonoscopy in one year.

## 2016-01-08 NOTE — Progress Notes (Signed)
Patient ID: Micheal Silva, male   DOB: Sep 04, 1965, 50 y.o.   MRN: [id number]  Chief Complaint  Patient presents with  . Follow-up    HPI Micheal Silva is a 50 y.o. male here today to discuss his colonoscopy done on 01/01/16. Last Christmas he was working on a car.and started having pain and went to doctor he was giving Mobic and than the pain went away. In April,2017 the pain started back. He states the pain is still waking him up at night.  HPI  Past Medical History:  Diagnosis Date  . BPH (benign prostatic hyperplasia)   . Diabetes mellitus ([place])   . HTN (hypertension)   . Hypogonadism in male   . Insomnia   . Obesity   . Right-sided chest wall pain 12/25/2015  . Sleep apnea     Past Surgical History:  Procedure Laterality Date  . APPENDECTOMY  2006   Dr. Tamala Julian  . CHOLECYSTECTOMY  2002   [facility]-Dr. Tamala Julian  . COLONOSCOPY  01/01/2016  . corrective surgery for sleep apnea  2005  . TONSILLECTOMY  07/2003   palate also removed    Family History  Problem Relation Age of Onset  . Prostate cancer Father   . Leukemia Father   . Diabetes Mellitus II    . Coronary artery disease    . Hypertension    . Breast cancer Mother     Social History Social History  Substance Use Topics  . Smoking status: Never Smoker  . Smokeless tobacco: Not on file  . Alcohol use No    Allergies  Allergen Reactions  . Vicodin [Hydrocodone-Acetaminophen]   . Atorvastatin     Other reaction(s): Other (See Comments) Leg cramp    Current Outpatient Prescriptions  Medication Sig Dispense Refill  . aspirin 81 MG chewable tablet Chew by mouth.    Marland Kitchen buPROPion (WELLBUTRIN XL) 150 MG 24 hr tablet take 300 mg in am and take 150 mg in evening  9  . cloNIDine (CATAPRES) 0.1 MG tablet Take 0.1 mg by mouth 2 (two) times daily.    Marland Kitchen gabapentin (NEURONTIN) 300 MG capsule Take 300 mg by mouth 3 (three) times daily.    Marland Kitchen losartan (COZAAR) 50 MG tablet Take 100 mg by mouth daily.     . metFORMIN (GLUCOPHAGE)  1000 MG tablet TAKE 1 TABLET (1,000 MG TOTAL) BY MOUTH 2 (TWO) TIMES DAILY WITH MEALS.  11  . naltrexone (DEPADE) 50 MG tablet Take by mouth.    . Probiotic Product (PROBIOTIC DAILY PO) Take by mouth.    . saccharomyces boulardii (FLORASTOR) 250 MG capsule Take by mouth.    . testosterone cypionate (DEPOTESTOSTERONE CYPIONATE) 200 MG/ML injection Inject 1 mL (200 mg total) into the muscle every 14 (fourteen) days. 10 mL 0  . fluticasone (FLONASE) 50 MCG/ACT nasal spray 1 spray by Each Nare route daily.     No current facility-administered medications for this visit.     Review of Systems Review of Systems  Constitutional: Negative.   Respiratory: Negative.   Cardiovascular: Negative.   Gastrointestinal: Positive for abdominal pain.    Blood pressure 128/74, pulse 76, resp. rate 12, height 5\' 6"  (1.676 m), weight 224 lb (101.6 kg).  Physical Exam Physical Exam  Constitutional: He is oriented to person, place, and time. He appears well-developed and well-nourished.  Eyes: Conjunctivae are normal. No scleral icterus.  Neck: Neck supple.  Cardiovascular: Normal rate, regular rhythm and normal heart sounds.   Pulmonary/Chest:  Effort normal and breath sounds normal.  Abdominal: Soft. Normal appearance and bowel sounds are normal. Tenderness:  lower rib cage.     Lymphadenopathy:    He has no cervical adenopathy.  Neurological: He is alert and oriented to person, place, and time.  Skin: Skin is warm and dry.    Data Reviewed 12/14/2015 CT scan of the abdomen and pelvis was re-reviewed. No evidence of fascial defect in the area of tenderness.  SMA, celiac and IMA vessels are patent.  MRI of the thoracic spine was negative.  Upper endoscopy showed mild gastritis without evidence of H. pylori.  Colonoscopy sewed several polyps measuring 12-25 mm. These were at the splenic flexure, ascending colon and sigmoid colon. 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 The inked margin was negative for dysplasia.  ltrasound dated 11/28/2015 was notable for a 2.3 cm right lower pole renal cyst. Patient status post cholecystectomy. Normal common bile duct.   Assessment    Focal abdominal pain worse with oral intake, no findings on colonoscopy do not account for his present pain.  Upper endoscopy showed patent GI tract without explanation for postprandial symptoms.    Plan    the patient had a course of oral steroids 10 mg daily which really did not provide any relief. His first episode of pain in December 2016 he had fairly prompt resolution of his symptoms with myopic. These did recur a few months later and now have been present for several months. Prior to considering diagnostic laparoscopy or university referral, the patient will make use of a slow steroid taper beginning at 60 mg daily and decreasing by 5 mg each day.  The patient should expect to see improvement within 1 week and has been asked to a phone follow-up in 2 weeks.  Patient to have a colonoscopy in one year.  This information has been scribed by Gaspar Cola CMA.   [name] 01/09/2016, 3:54 PM

## 2016-01-10 ENCOUNTER — Ambulatory Visit: Payer: Self-pay | Admitting: General Surgery

## 2016-01-15 ENCOUNTER — Telehealth: Payer: Self-pay | Admitting: *Deleted

## 2016-01-15 NOTE — Telephone Encounter (Signed)
Patient was told to call you today regarding the prednisone he is taking. He stated that the first couple of days it was great but now he is experiencing the same symptoms that he was when he came to his appointment on 01/08/16 (Abdominal pain)

## 2016-01-16 NOTE — Telephone Encounter (Signed)
Reviewed the patient's response to the steroid taper. Improved for the first few days, now back to baseline.  Previous evaluation with CT, ultrasound as well as upper and lower endoscopy and MRI of the thoracic spine have been negative.  Options for further management reviewed by phone: 1) diagnostic laparoscopy/possible laparotomy with the attendant risks of surgery, bleeding, infection, hollow viscus injury versus 2) university referral.  At this time the patient desires to proceed to laparoscopy and will schedule this at a convenient date.

## 2016-01-17 ENCOUNTER — Telehealth: Payer: Self-pay | Admitting: *Deleted

## 2016-01-17 ENCOUNTER — Other Ambulatory Visit: Payer: Self-pay | Admitting: General Surgery

## 2016-01-17 NOTE — Telephone Encounter (Signed)
Patient contacted today and surgery has been scheduled for 01-25-16 at [facility].   Surgery instructions have been mailed to the patient.  He was instructed to call the office should he have further questions.

## 2016-01-18 ENCOUNTER — Encounter: Payer: Self-pay | Admitting: General Surgery

## 2016-01-22 ENCOUNTER — Encounter
Admission: RE | Admit: 2016-01-22 | Discharge: 2016-01-22 | Disposition: A | Discharge status: Home / Self Care | Payer: BC Managed Care – PPO | Source: Ambulatory Visit | Attending: General Surgery | Admitting: General Surgery

## 2016-01-22 HISTORY — DX: Cardiac murmur, unspecified: R01.1

## 2016-01-22 HISTORY — DX: Unspecified osteoarthritis, unspecified site: M19.90

## 2016-01-22 NOTE — Patient Instructions (Signed)
  Your procedure is scheduled on: 01-25-16 Report to Same Day Surgery 2nd floor medical mall To find out your arrival time please call [phone] between 1PM - 3PM on 01-24-16  Remember: Instructions that are not followed completely may result in serious medical risk, up to and including death, or upon the discretion of your surgeon and anesthesiologist your surgery may need to be rescheduled.    _x___ 1. Do not eat food or drink liquids after midnight. No gum chewing or hard candies.     __x__ 2. No Alcohol for 24 hours before or after surgery.   __x__3. No Smoking for 24 prior to surgery.   ____  4. Bring all medications with you on the day of surgery if instructed.    __x__ 5. Notify your doctor if there is any change in your medical condition     (cold, fever, infections).     Do not wear jewelry, make-up, hairpins, clips or nail polish.  Do not wear lotions, powders, or perfumes. You may wear deodorant.  Do not shave 48 hours prior to surgery. Men may shave face and neck.  Do not bring valuables to the hospital.    [facility] is not responsible for any belongings or valuables.               Contacts, dentures or bridgework may not be worn into surgery.  Leave your suitcase in the car. After surgery it may be brought to your room.  For patients admitted to the hospital, discharge time is determined by your treatment team.   Patients discharged the day of surgery will not be allowed to drive home.    Please read over the following fact sheets that you were given:   [facility] Preparing for Surgery and or MRSA Information   _x___ Take these medicines the morning of surgery with A SIP OF WATER:    1. CLONIDINE  2.LOSARTAN  3.GABAPENTIN  4.  5.  6.  ____Fleets enema or Magnesium Citrate as directed.   ____ Use CHG Soap or sage wipes as directed on instruction sheet   ____ Use inhalers on the day of surgery and bring to hospital day of surgery  _X___ Stop metformin  2 days prior to surgery-LAST DOSE Monday, October 16th    ____ Take 1/2 of usual insulin dose the night before surgery and none on the morning of  surgery.   _X___ Stop aspirin or coumadin, or plavix-OK TO CONTINUE 81 MG ASA-DO NOT TAKE AM OF SURGERY  __ Stop Anti-inflammatories such as Advil, Aleve, Ibuprofen, Motrin, Naproxen,          Naprosyn, Goodies powders or aspirin products. Ok to take Tylenol.   _X___ Stop supplements until after surgery-STOP PROBIOTIC NOW  ____ Bring C-Pap to the hospital.

## 2016-01-22 NOTE — Pre-Procedure Instructions (Signed)
ECG 12-lead7/13/2017 [place] Component Name Value Ref Range  Vent Rate (bpm) 81   PR Interval (msec) 146   QRS Interval (msec) 86   QT Interval (msec) 356   QTc (msec) 413   Result Narrative  Normal sinus rhythm Nonspecific T wave abnormalities, Inferolateral leads Abnormal ECG  When compared with ECG of 02-Jan-2015 20:51, No significant change was found I reviewed and concur with this report. Electronically signed [id number], MD, NEIL ([id number]) on 10/19/2015 9:40:42 PM

## 2016-01-22 NOTE — Pre-Procedure Instructions (Signed)
Echocardiogram stress test with contrast7/19/2017 [place] Component Name Value Ref Range  LV Ejection Fraction (%) 55 %  Left Atrium Diameter (cm) 3.7 cm  LV End Diastolic Diameter (cm) 4.9 cm  LV End Systolic Diameter (cm) 2.6 cm  LV Septum Wall Thickness (cm) 1.0 cm  LV Posterior Wall Thickness (cm) 1.0 cm  Tricuspid Valve Regurgitation Grade trivial   Mitral Valve Regurgitation Grade trivial   Mitral Valve Stenosis Grade none   Aortic Valve Regurgitation Grade none   Aortic Valve Stenosis Grade none   Result Narrative   Micheal Silva CENTERNorris, Micheal  [id number] DOB: 1965/12/18  CARDIAC DIAGNOSTIC UNIT Date: 10/25/2015 09:00:00  Adult Male Age: 50  STRESS ECHO REPORTOutpatient  MPDC ---------------------------------------------------- MD1: Micheal Silva STUDY: Stress Echo TAPE: 0000:00:0:00:00 BP: 125/77  ECHO: Yes DOPPLER: YesFILE: [id number]: HR: 60 COLOR: YesCONTRAST: Yes MACHINE: [id number] #15Height: 69 in RV BIOPSY: No 3D: No SOUND QLTY: PoorWeight: 232 lbs  MEDIUM: DefinityBSA: 2.26,BMI: 34.30 ------------------------------------------------------------------------------  HISTORY:DOE REASON: Assess LV function INDICATION: R06.09 - Other forms of dyspnea.  RESTING ECHOCARDIOGRAPHIC MEASUREMENTS----------------------------------------   2D DIMENSIONS  AORTAValuesNormal Range Aorta Sin 3.6 cm[3.1 - 3.7] Asc.Aorta 3.5 cm[2.6 - 3.4]  LEFT VENTRICLE LVIDd 4.9 cm[4.2 - 5.9] LVIDs 2.6 cm  FS47%[> 25%] SWT 1.0 cm[0.6  - 1.0] PWT 1.0 cm[0.6 - 1.0]  LEFT ATRIUM LA Diam 3.7 cm[3.0 - 4.0] LA Area21 cm2 [< 20] LA Volume69 mL[18 - 58]  RESTING ECHOCARDIOGRAPHIC DESCRIPTIONS ---------------------------------------   LEFT VENTRICLE  Size: Normal Contraction: Normal LV Masses: No Masses LVH: None   RIGHT VENTRICLE  Size: Normal Free Wall: Normal Contraction: Normal RV Masses: No Masses   PERICARDIUM Fluid: No effusion  RESTING DOPPLER ECHO and OTHER SPECIAL PROCEDURES ----------------------------   Aortic: No ARNo AS   Mitral: TRIVIAL MR No MS  MV Inflow Vel: 55.0 cm/sMV Annulus E'Vel: 5.0 cm/sE/E' Ratio: 11  Tricuspid: TRIVIAL TR No TS  Pulmonary: No PRNo PS Other:  DEFINITY CONTRAST SHOWS ENHANCED LV BORDERS  STRESS ECHOCARDIOGRAPHY ------------------------------------------------------   Protocol: Treadmill ([place]) Drugs: None Target Heart Rate: 144Maximum Predicted Heart Rate: 170 Resting ECG: Normal  TYPE STAGETIMEHRBPRPESPO2 COMMENTS -------- ----- --------- --- ------- ----- ---- ------------------------------ Baseline60 125/ 77 Stress 1180 sec. 100 164/ 979/2099% Stress 2180 sec. 115 159/ 99 03/2097% Stress 3180 sec. 134 175/106 15/2097% SOB 6/10, DEFINITY 92mL IV Stress 4121 sec. 160/17/2097% SOB 7/10, DEFINITY 61mL IV Recovery 11 min. 127 184/ 93 Recovery 22 min. 108 198/ 91 Recovery 34 min.92 151/ 81SOB 5/10 Recovery 46 min.87 146/ 81  Stress Duration: 11.02 minutes.Max Stress H.R: 160  Target Heart Rate (144) Achieved: Yes Max. workload of 13.40METs was achieved during  exercise. HR Response: Appropriate BP Response: Normal resting BP - appropriate response  WALL SEGMENT FINDINGS --------------------------------------------------------   RestStress  --------------- --------------- Anterior Septum Basal: NormalHyperkinetic Mid: NormalHyperkinetic Apical Septum: NormalHyperkinetic  Anterior Wall Basal: NormalHyperkinetic Mid: NormalHyperkinetic  Apical: NormalHyperkinetic   Lateral Wall Basal: NormalHyperkinetic Mid: NormalHyperkinetic  Apical: NormalHyperkinetic   Posterior Wall Basal: NormalHyperkinetic Mid: NormalHyperkinetic  Inferior Wall Basal: NormalHyperkinetic Mid: NormalHyperkinetic  Apical: NormalHyperkinetic  Inferior Septum Basal: NormalHyperkinetic Mid: NormalHyperkinetic   EF: >55%>55%   ADDITIONAL FINDINGS ----------------------------------------------------------  Chest Discomfort: None Arrhythmia: Unifocal PVC's Termination Reason: Dyspnea  Complications: None  STRESS ECG RESULTS -----------------------------------------------------------   ECG Results: Normal. .  INTERPRETATION ---------------------------------------------------------------   NORMAL STRESS TEST. NORMAL RESTING STUDY WITH NO WALL MOTION ABNORMALITIES  AT REST AND PEAK STRESS.  VALVULAR REGURGITATION: TRIVIAL MR, TRIVIAL TR  NO VALVULAR STENOSIS  Maximum workload of 13.40 METs was achieved during exercise.  NORMAL RESTING BP - APPROPRIATE RESPONSE   (Report version 4.0)Interpreted and Electronically  signed  Perform. by: Micheal Silva, RDCS by: Micheal Shames, MD  Resp.Person: Micheal Silva, RDCS On: 10/25/2015 13:37:10  Resp. Staff: Micheal Caraway, RN  Status Results Details    Silva Encounter on 10/25/2015 [place]")' href="[email or web address]/">Encounter Summary

## 2016-01-25 ENCOUNTER — Encounter
Admission: RE | Disposition: A | Discharge status: Home / Self Care | Payer: Self-pay | Source: Ambulatory Visit | Attending: General Surgery

## 2016-01-25 ENCOUNTER — Ambulatory Visit: Payer: BC Managed Care – PPO | Admitting: Anesthesiology

## 2016-01-25 ENCOUNTER — Ambulatory Visit
Admission: RE | Admit: 2016-01-25 | Discharge: 2016-01-25 | Disposition: A | Discharge status: Home / Self Care | Payer: BC Managed Care – PPO | Source: Ambulatory Visit | Attending: General Surgery | Admitting: General Surgery

## 2016-01-25 ENCOUNTER — Encounter: Payer: Self-pay | Admitting: *Deleted

## 2016-01-25 DIAGNOSIS — K660 Peritoneal adhesions (postprocedural) (postinfection): Secondary | ICD-10-CM | POA: Insufficient documentation

## 2016-01-25 DIAGNOSIS — G4730 Sleep apnea, unspecified: Secondary | ICD-10-CM | POA: Insufficient documentation

## 2016-01-25 DIAGNOSIS — Z7984 Long term (current) use of oral hypoglycemic drugs: Secondary | ICD-10-CM | POA: Insufficient documentation

## 2016-01-25 DIAGNOSIS — R1011 Right upper quadrant pain: Secondary | ICD-10-CM | POA: Diagnosis not present

## 2016-01-25 DIAGNOSIS — E119 Type 2 diabetes mellitus without complications: Secondary | ICD-10-CM | POA: Insufficient documentation

## 2016-01-25 DIAGNOSIS — Z79899 Other long term (current) drug therapy: Secondary | ICD-10-CM | POA: Insufficient documentation

## 2016-01-25 DIAGNOSIS — G8929 Other chronic pain: Secondary | ICD-10-CM | POA: Diagnosis not present

## 2016-01-25 HISTORY — DX: Failed or difficult intubation, initial encounter: T88.4XXA

## 2016-01-25 HISTORY — PX: LAPAROSCOPY: SHX197

## 2016-01-25 HISTORY — PX: LAPAROSCOPIC LYSIS OF ADHESIONS: SHX5905

## 2016-01-25 LAB — GLUCOSE, CAPILLARY
GLUCOSE-CAPILLARY: 173 mg/dL — AB (ref 65–99)
Glucose-Capillary: 183 mg/dL — ABNORMAL HIGH (ref 65–99)

## 2016-01-25 SURGERY — LAPAROSCOPY, DIAGNOSTIC
Anesthesia: General | Wound class: Clean

## 2016-01-25 MED ORDER — SUGAMMADEX SODIUM 200 MG/2ML IV SOLN
INTRAVENOUS | Status: DC | PRN
  Administered 2016-01-25: 200 mg via INTRAVENOUS

## 2016-01-25 MED ORDER — ACETAMINOPHEN 10 MG/ML IV SOLN
INTRAVENOUS | Status: AC
  Filled 2016-01-25: qty 100

## 2016-01-25 MED ORDER — ROCURONIUM BROMIDE 100 MG/10ML IV SOLN
INTRAVENOUS | Status: DC | PRN
  Administered 2016-01-25: 30 mg via INTRAVENOUS

## 2016-01-25 MED ORDER — FENTANYL CITRATE (PF) 100 MCG/2ML IJ SOLN
25.0000 ug | INTRAMUSCULAR | Status: DC | PRN
  Administered 2016-01-25: 25 ug via INTRAVENOUS

## 2016-01-25 MED ORDER — LIDOCAINE HCL (CARDIAC) 20 MG/ML IV SOLN
INTRAVENOUS | Status: DC | PRN
  Administered 2016-01-25: 100 mg via INTRAVENOUS

## 2016-01-25 MED ORDER — ACETAMINOPHEN 10 MG/ML IV SOLN
INTRAVENOUS | Status: DC | PRN
  Administered 2016-01-25: 1000 mg via INTRAVENOUS

## 2016-01-25 MED ORDER — LACTATED RINGERS IV SOLN
INTRAVENOUS | Status: DC | PRN
  Administered 2016-01-25: 07:00:00 via INTRAVENOUS

## 2016-01-25 MED ORDER — KETOROLAC TROMETHAMINE 30 MG/ML IJ SOLN
INTRAMUSCULAR | Status: DC | PRN
  Administered 2016-01-25: 30 mg via INTRAVENOUS

## 2016-01-25 MED ORDER — OXYCODONE HCL 5 MG/5ML PO SOLN: 5.0000 mg | Freq: Once | ORAL | Status: DC | PRN

## 2016-01-25 MED ORDER — PHENYLEPHRINE HCL 10 MG/ML IJ SOLN
INTRAMUSCULAR | Status: DC | PRN
Start: 2016-01-25 — End: 2016-01-25
  Administered 2016-01-25: 100 ug via INTRAVENOUS
  Administered 2016-01-25: 200 ug via INTRAVENOUS

## 2016-01-25 MED ORDER — FENTANYL CITRATE (PF) 100 MCG/2ML IJ SOLN
INTRAMUSCULAR | Status: DC | PRN
  Administered 2016-01-25: 50 ug via INTRAVENOUS

## 2016-01-25 MED ORDER — PROPOFOL 10 MG/ML IV BOLUS
INTRAVENOUS | Status: DC | PRN
  Administered 2016-01-25: 200 mg via INTRAVENOUS

## 2016-01-25 MED ORDER — FAMOTIDINE 20 MG PO TABS
ORAL_TABLET | ORAL | Status: AC
  Administered 2016-01-25: 20 mg via ORAL
  Filled 2016-01-25: qty 1

## 2016-01-25 MED ORDER — MIDAZOLAM HCL 2 MG/2ML IJ SOLN
INTRAMUSCULAR | Status: DC | PRN
  Administered 2016-01-25: 2 mg via INTRAVENOUS

## 2016-01-25 MED ORDER — DEXAMETHASONE SODIUM PHOSPHATE 10 MG/ML IJ SOLN
INTRAMUSCULAR | Status: DC | PRN
Start: 2016-01-25 — End: 2016-01-25
  Administered 2016-01-25: 10 mg via INTRAVENOUS

## 2016-01-25 MED ORDER — FAMOTIDINE 20 MG PO TABS
20.0000 mg | ORAL_TABLET | Freq: Once | ORAL | Status: AC
  Administered 2016-01-25: 20 mg via ORAL

## 2016-01-25 MED ORDER — OXYCODONE HCL 5 MG PO TABS: 5.0000 mg | ORAL_TABLET | Freq: Once | ORAL | Status: DC | PRN

## 2016-01-25 MED ORDER — FENTANYL CITRATE (PF) 100 MCG/2ML IJ SOLN
INTRAMUSCULAR | Status: AC
Start: 2016-01-25 — End: 2016-01-25
  Administered 2016-01-25: 25 ug via INTRAVENOUS
  Filled 2016-01-25: qty 2

## 2016-01-25 MED ORDER — TRAMADOL HCL 50 MG PO TABS: 50.0000 mg | ORAL_TABLET | ORAL | 0 refills | Status: DC | PRN

## 2016-01-25 MED ORDER — SODIUM CHLORIDE 0.9 % IV SOLN
INTRAVENOUS | Status: DC
  Administered 2016-01-25: 07:00:00 via INTRAVENOUS

## 2016-01-25 SURGICAL SUPPLY — 33 items
BAG COUNTER SPONGE EZ (MISCELLANEOUS) IMPLANT
BLADE SURG 11 STRL SS SAFETY (MISCELLANEOUS) ×4 IMPLANT
CANISTER SUCT 1200ML W/VALVE (MISCELLANEOUS) ×4 IMPLANT
CANNULA DILATOR  5MM W/SLV (CANNULA) ×1
CANNULA DILATOR 10 W/SLV (CANNULA) ×3 IMPLANT
CANNULA DILATOR 10MM W/SLV (CANNULA) ×1
CANNULA DILATOR 5 W/SLV (CANNULA) ×3 IMPLANT
CATH TRAY 16F METER LATEX (MISCELLANEOUS) IMPLANT
CHLORAPREP W/TINT 26ML (MISCELLANEOUS) ×4 IMPLANT
CLOSURE WOUND 1/2 X4 (GAUZE/BANDAGES/DRESSINGS) ×1
COUNTER SPONGE BAG EZ (MISCELLANEOUS)
DISSECTOR KITTNER STICK (MISCELLANEOUS) ×2 IMPLANT
DISSECTORS/KITTNER STICK (MISCELLANEOUS) ×4
DRESSING TELFA 4X3 1S ST N-ADH (GAUZE/BANDAGES/DRESSINGS) ×4 IMPLANT
DRSG TEGADERM 2-3/8X2-3/4 SM (GAUZE/BANDAGES/DRESSINGS) ×12 IMPLANT
ELECT REM PT RETURN 9FT ADLT (ELECTROSURGICAL) ×4
ELECTRODE REM PT RTRN 9FT ADLT (ELECTROSURGICAL) ×2 IMPLANT
GLOVE BIO SURGEON STRL SZ7.5 (GLOVE) ×12 IMPLANT
GLOVE INDICATOR 8.0 STRL GRN (GLOVE) ×8 IMPLANT
GOWN STRL REUS W/ TWL LRG LVL3 (GOWN DISPOSABLE) ×4 IMPLANT
GOWN STRL REUS W/TWL LRG LVL3 (GOWN DISPOSABLE) ×4
KIT RM TURNOVER STRD PROC AR (KITS) ×4 IMPLANT
LABEL OR SOLS (LABEL) IMPLANT
NS IRRIG 500ML POUR BTL (IV SOLUTION) ×4 IMPLANT
PACK LAP CHOLECYSTECTOMY (MISCELLANEOUS) ×4 IMPLANT
POUCH ENDO CATCH 10MM SPEC (MISCELLANEOUS) IMPLANT
SEAL FOR SCOPE WARMER C3101 (MISCELLANEOUS) IMPLANT
STRIP CLOSURE SKIN 1/2X4 (GAUZE/BANDAGES/DRESSINGS) ×3 IMPLANT
SUT VIC AB 0 SH 27 (SUTURE) ×4 IMPLANT
SUT VIC AB 4-0 FS2 27 (SUTURE) ×4 IMPLANT
SWABSTK COMLB BENZOIN TINCTURE (MISCELLANEOUS) ×4 IMPLANT
TROCAR XCEL NON-BLD 5MMX100MML (ENDOMECHANICALS) ×4 IMPLANT
TUBING INSUFFLATOR HI FLOW (MISCELLANEOUS) ×4 IMPLANT

## 2016-01-25 NOTE — H&P (Signed)
No change in clinical exam or history. For laparoscopy today.

## 2016-01-25 NOTE — Anesthesia Postprocedure Evaluation (Signed)
Anesthesia Post Note  Patient: Micheal Silva  Procedure(s) Performed: Procedure(s) (LRB): LAPAROSCOPY DIAGNOSTIC (N/A) LAPAROSCOPIC LYSIS OF ADHESIONS  Patient location during evaluation: PACU Anesthesia Type: General Level of consciousness: awake and alert Pain management: pain level controlled Vital Signs Assessment: post-procedure vital signs reviewed and stable Respiratory status: spontaneous breathing, nonlabored ventilation, respiratory function stable and patient connected to nasal cannula oxygen Cardiovascular status: blood pressure returned to baseline and stable Postop Assessment: no signs of nausea or vomiting Anesthetic complications: no    Last Vitals:  Vitals:   01/25/16 0908 01/25/16 0927  BP: 111/66 115/61  Pulse: 76 78  Resp: 16 16  Temp: 36.6 C     Last Pain:  Vitals:   01/25/16 0927  TempSrc:   PainSc: 2                  Precious Haws Akari [employer]

## 2016-01-25 NOTE — Anesthesia Preprocedure Evaluation (Signed)
Anesthesia Evaluation  Patient identified by MRN, date of birth, ID band Patient awake    Reviewed: Allergy & Precautions, H&P , NPO status , Patient's Chart, lab work & pertinent test results  History of Anesthesia Complications Negative for: history of anesthetic complications  Airway Mallampati: III  TM Distance: <3 FB Neck ROM: full    Dental no notable dental hx. (+) Poor Dentition, Chipped   Pulmonary neg shortness of breath, sleep apnea and Continuous Positive Airway Pressure Ventilation ,    Pulmonary exam normal breath sounds clear to auscultation       Cardiovascular Exercise Tolerance: Good hypertension, (-) angina(-) Past MI and (-) DOE Normal cardiovascular exam Rhythm:regular Rate:Normal     Neuro/Psych negative neurological ROS  negative psych ROS   GI/Hepatic negative GI ROS, Neg liver ROS,   Endo/Other  diabetes, Type 2, Oral Hypoglycemic Agents  Renal/GU      Musculoskeletal  (+) Arthritis ,   Abdominal   Peds  Hematology negative hematology ROS (+)   Anesthesia Other Findings Past Medical History: No date: Arthritis     Comment: HANDS BIL No date: BPH (benign prostatic hyperplasia) No date: Diabetes mellitus ([facility]) No date: Heart murmur No date: HTN (hypertension) No date: Hypogonadism in male No date: Insomnia No date: Obesity 12/25/2015: Right-sided chest wall pain No date: Sleep apnea     Comment: NO CPAP X 6 MONTHS  Past Surgical History: 2006: APPENDECTOMY     Comment: Dr. Tamala Julian 2002: CHOLECYSTECTOMY     Comment: [facility]-Dr. Tamala Julian 01/01/2016: COLONOSCOPY 01/01/2016: COLONOSCOPY WITH PROPOFOL N/A     Comment: Procedure: COLONOSCOPY WITH PROPOFOL;                Surgeon: [name], MD;  Location: [facility]              ENDOSCOPY;  Service: Endoscopy;  Laterality:               N/A; 2005: corrective surgery for sleep apnea 01/01/2016: ESOPHAGOGASTRODUODENOSCOPY (EGD) WITH PROPOFOL  N/A     Comment: Procedure: ESOPHAGOGASTRODUODENOSCOPY (EGD)               WITH PROPOFOL;  Surgeon: [name], MD;              Location: [facility] ENDOSCOPY;  Service: Endoscopy;               Laterality: N/A; 07/2003: TONSILLECTOMY     Comment: palate also removed  BMI    Body Mass Index:  36.15 kg/m      Reproductive/Obstetrics negative OB ROS                             Anesthesia Physical Anesthesia Plan  ASA: III  Anesthesia Plan: General ETT   Post-op Pain Management:    Induction:   Airway Management Planned:   Additional Equipment:   Intra-op Plan:   Post-operative Plan:   Informed Consent: I have reviewed the patients History and Physical, chart, labs and discussed the procedure including the risks, benefits and alternatives for the proposed anesthesia with the patient or authorized representative who has indicated his/her understanding and acceptance.     Plan Discussed with: Anesthesiologist, CRNA and Surgeon  Anesthesia Plan Comments:         Anesthesia Quick Evaluation

## 2016-01-25 NOTE — Op Note (Signed)
Preoperative diagnosis: Right upper quadrant abdominal pain.  Postoperative diagnosis: Same, adhesions of the omentum to the liver.  Operative procedure: Diagnostic laparoscopy, lysis of adhesions.  Operating surgeon: Charlie Pitter, M.D.  Anesthesia: Gen. endotracheal.  Estimated blood loss: Less than 5 mL.  Clinical note: This 50 year old male has had several months of right upper quadrant pain. Diagnostic testing including thoracic MRI, CT of the abdomen and pelvis, ultrasound and upper and lower endoscopy has been noncontributory. His pains been persistent in location and intensity, worse with meals. He was given the opportunity for diagnostic laparoscopy.  Operative note: With the patient and her adequate general endotracheal anesthesia the abdomen was prepped with ChloraPrep and draped. Hared previously been removed from the operative field with clippers. In Trendelenburg position a very's needle was placed. After assuring intra-abdominal location with the hanging drop test the abdomen was insufflated with CO2 a 10 mmHg pressure. A 10 mm Step was expanded. Inspection showed no evidence of injury from initial port placement. A 5 mm port was placed in the epigastrium and a 5 mm port placed in the right lower quadrant for manipulation. There were no adhesions to the anterior bowel wall. Scants single thin band adhesion at the cecum from his previous ruptured appendix in 2006. The liver was free and unremarkable. No evidence of Fitz-Hugh-Q-Curtis bands.  There was a band of omentum into the gallbladder fossa. This was taken down with cautery dissection to expose the duodenal sweep which was unremarkable and not tethered. The area of the portal triad was left undisturbed. No evidence of inflammation in the hepatic flexure or unusual adhesions that might be producing pain from tethering.  The abdomen was desufflated under direct vision. Ports were removed. Fascia at the umbilicus was closed with a  single 0 Vicryl suture. Skin incisions were closed with 4-0 Vicryl subcutaneous sutures. Benzoin, Steri-Strips, Telfa and Tegaderm dressings were applied.  The patient tolerated the procedure well and was taken the recovery room in stable condition.  Postoperative narcotic prescribing was reviewed with the anesthesiologist.

## 2016-01-25 NOTE — Transfer of Care (Signed)
Immediate Anesthesia Transfer of Care Note  Patient: Micheal Silva  Procedure(s) Performed: Procedure(s): LAPAROSCOPY DIAGNOSTIC (N/A) LAPAROSCOPIC LYSIS OF ADHESIONS  Patient Location: PACU  Anesthesia Type:General  Level of Consciousness: sedated  Airway & Oxygen Therapy: Patient Spontanous Breathing and Patient connected to face mask oxygen  Post-op Assessment: Report given to RN and Post -op Vital signs reviewed and stable  Post vital signs: Reviewed and stable  Last Vitals:  Vitals:   01/25/16 0824 01/25/16 0839  BP: 123/80 114/74  Pulse: 78 79  Resp: (!) 22 19  Temp: 36.2 C     Last Pain:  Vitals:   01/25/16 0824  TempSrc:   PainSc: Asleep      Patients Stated Pain Goal: 2 ([id number])  Complications: No apparent anesthesia complications

## 2016-01-25 NOTE — Discharge Instructions (Signed)

## 2016-01-25 NOTE — Anesthesia Procedure Notes (Signed)
Procedure Name: Intubation Date/Time: 01/25/2016 7:37 AM Performed by: Justus Memory Pre-anesthesia Checklist: Patient identified, Emergency Drugs available, Suction available and Patient being monitored Patient Re-evaluated:Patient Re-evaluated prior to inductionOxygen Delivery Method: Circle system utilized Preoxygenation: Pre-oxygenation with 100% oxygen Intubation Type: IV induction Ventilation: Mask ventilation without difficulty Laryngoscope Size: Glidescope, 4, Miller and 2 (lopro s4) Grade View: Grade III Tube type: Oral Tube size: 7.0 mm Number of attempts: 2 Airway Equipment and Method: Stylet,  Rigid stylet and Video-laryngoscopy Placement Confirmation: ETT inserted through vocal cords under direct vision,  positive ETCO2,  CO2 detector and breath sounds checked- equal and bilateral Secured at: 21 cm Tube secured with: Tape Dental Injury: Bloody posterior oropharynx  Difficulty Due To: Difficulty was anticipated, Difficult Airway- due to anterior larynx and Difficult Airway- due to immobile epiglottis Future Recommendations: Recommend- induction with short-acting agent, and alternative techniques readily available Comments: Pt with TD <3cm, DL with miller 2, only obtained a grade 3 view and difficult to trap epiglottis, next DL with glidescope LoPro S4 /c rigid stylet, good visualization still anterior but able to pass tube with minor difficulty.

## 2016-01-29 ENCOUNTER — Telehealth: Payer: Self-pay | Admitting: General Surgery

## 2016-01-29 NOTE — Telephone Encounter (Signed)
PATIENT CALLED WANTING TO KNOW IF HE SHOULD BE AT WORK OR OUT UNTIL HIS POST OP ON 02-05-16 @4 :15PM. HE WORKS IN A [employer].IF HE IS ABLE TO RETURN TO WORK DOES HE HAVE ANY RESTRICTIONS?PLEASE CALL PATIENT'S CELL#/MTH

## 2016-01-30 NOTE — Telephone Encounter (Signed)
May return to work if he is comfortable.  No heavy lifting (over 10 pounds) for one week after surgery.

## 2016-01-30 NOTE — Telephone Encounter (Signed)
Patient notified as instructed, verbalized understanding. Aware of post op appointment on 02/05/16.

## 2016-01-31 ENCOUNTER — Encounter: Payer: Self-pay | Admitting: General Surgery

## 2016-02-05 ENCOUNTER — Encounter: Payer: Self-pay | Admitting: General Surgery

## 2016-02-05 ENCOUNTER — Ambulatory Visit (INDEPENDENT_AMBULATORY_CARE_PROVIDER_SITE_OTHER): Payer: BC Managed Care – PPO | Admitting: General Surgery

## 2016-02-05 VITALS — BP 130/80 | HR 74 | Resp 12 | Ht 66.0 in | Wt 222.0 lb

## 2016-02-05 DIAGNOSIS — R1011 Right upper quadrant pain: Secondary | ICD-10-CM

## 2016-02-05 DIAGNOSIS — G8929 Other chronic pain: Secondary | ICD-10-CM

## 2016-02-05 NOTE — Progress Notes (Signed)
Patient ID: Micheal Silva, male   DOB: 04/23/1965, 50 y.o.   MRN: [id number]  Chief Complaint  Patient presents with  . Routine Post Op    laparoscopy    HPI Micheal Silva is a 50 y.o. male here today for his post op laparoscopy done on 01/25/2016. Patient states is is doing well.  The right upper quadrant pain has resolved. He does report some soreness at the inferior lateral port site on the right side of the abdomen. HPI  Past Medical History:  Diagnosis Date  . Arthritis    HANDS BIL  . BPH (benign prostatic hyperplasia)   . Diabetes mellitus ([place])   . Difficult intubation   . Heart murmur   . HTN (hypertension)   . Hypogonadism in male   . Insomnia   . Obesity   . Right-sided chest wall pain 12/25/2015  . Sleep apnea    NO CPAP X 6 MONTHS    Past Surgical History:  Procedure Laterality Date  . APPENDECTOMY  2006   Dr. Tamala Julian  . CHOLECYSTECTOMY  2002   [facility]-Dr. Tamala Julian  . COLONOSCOPY  01/01/2016  . COLONOSCOPY WITH PROPOFOL N/A 01/01/2016   Procedure: COLONOSCOPY WITH PROPOFOL;  Surgeon: [name], MD;  Location: [facility] ENDOSCOPY;  Service: Endoscopy;  Laterality: N/A;  . corrective surgery for sleep apnea  2005  . ESOPHAGOGASTRODUODENOSCOPY (EGD) WITH PROPOFOL N/A 01/01/2016   Procedure: ESOPHAGOGASTRODUODENOSCOPY (EGD) WITH PROPOFOL;  Surgeon: [name], MD;  Location: [facility] ENDOSCOPY;  Service: Endoscopy;  Laterality: N/A;  . LAPAROSCOPIC LYSIS OF ADHESIONS  01/25/2016   Procedure: LAPAROSCOPIC LYSIS OF ADHESIONS;  Surgeon: [name], MD;  Location: [facility] ORS;  Service: General;;  . LAPAROSCOPY N/A 01/25/2016   Procedure: LAPAROSCOPY DIAGNOSTIC;  Surgeon: [name], MD;  Location: [facility] ORS;  Service: General;  Laterality: N/A;  . TONSILLECTOMY  07/2003   palate also removed    Family History  Problem Relation Age of Onset  . Prostate cancer Father   . Leukemia Father   . Diabetes Mellitus II    . Coronary artery disease    .  Hypertension    . Breast cancer Mother     Social History Social History  Substance Use Topics  . Smoking status: Never Smoker  . Smokeless tobacco: Never Used  . Alcohol use No    Allergies  Allergen Reactions  . Vicodin [Hydrocodone-Acetaminophen] Nausea And Vomiting  . Atorvastatin     Other reaction(s): Other (See Comments) Leg cramp    Current Outpatient Prescriptions  Medication Sig Dispense Refill  . aspirin 81 MG chewable tablet Chew by mouth.    Marland Kitchen buPROPion (WELLBUTRIN XL) 150 MG 24 hr tablet take 300 mg in am and take 150 mg in evening  9  . cloNIDine (CATAPRES) 0.1 MG tablet Take 0.1 mg by mouth 2 (two) times daily.    . fluticasone (FLONASE) 50 MCG/ACT nasal spray 1 spray by Each Nare route PRN    . gabapentin (NEURONTIN) 300 MG capsule Take 300 mg by mouth 3 (three) times daily.    Marland Kitchen losartan (COZAAR) 50 MG tablet Take 100 mg by mouth every morning.     . metFORMIN (GLUCOPHAGE) 1000 MG tablet TAKE 1 TABLET (1,000 MG TOTAL) BY MOUTH 2 (TWO) TIMES DAILY WITH MEALS.  11  . naltrexone (DEPADE) 50 MG tablet Take 50 mg by mouth at bedtime.     . Probiotic Product (PROBIOTIC DAILY PO) Take by mouth.    Marland Kitchen  testosterone cypionate (DEPOTESTOSTERONE CYPIONATE) 200 MG/ML injection Inject 1 mL (200 mg total) into the muscle every 14 (fourteen) days. 10 mL 0  . traMADol (ULTRAM) 50 MG tablet Take 1 tablet (50 mg total) by mouth every 4 (four) hours as needed for moderate pain or severe pain. 10 tablet 0   No current facility-administered medications for this visit.     Review of Systems Review of Systems  Constitutional: Negative.   Respiratory: Negative.   Cardiovascular: Negative.     Blood pressure 130/80, pulse 74, resp. rate 12, height 5\' 6"  (1.676 m), weight 222 lb (100.7 kg).  Physical Exam Physical Exam  Constitutional: He is oriented to person, place, and time. He appears well-developed and well-nourished.  Abdominal: Soft. Bowel sounds are normal.    Port  sites are clean and healing well.   Neurological: He is alert and oriented to person, place, and time.  Skin: Skin is warm and dry.    Data Reviewed Copies of images obtained during the procedure were reviewed with the patient and his wife.  Assessment    Doing well status post diagnostic laparoscopy.    Plan    There were no adhesions directly to the abdominal wall as I anticipated, and no evidence of scarring from his severe appendicitis 10 years ago. He is doing well at this time. We discussed possibility of recurrent symptoms.    Return as needed.  Send a message in my chart in a month to let us know how you are doing.  This information has been scribed by Gaspar Cola CMA.   [name] 02/05/2016, 4:22 PM

## 2016-02-05 NOTE — Patient Instructions (Addendum)
Return as needed.Send a message in my chart in a month to let us know how you are doing.

## 2016-06-10 ENCOUNTER — Emergency Department
Admission: EM | Admit: 2016-06-10 | Discharge: 2016-06-11 | Disposition: A | Discharge status: Home / Self Care | Payer: BC Managed Care – PPO | Source: Home / Self Care | Attending: Emergency Medicine | Admitting: Emergency Medicine

## 2016-06-10 DIAGNOSIS — Z79899 Other long term (current) drug therapy: Secondary | ICD-10-CM | POA: Insufficient documentation

## 2016-06-10 DIAGNOSIS — T402X1A Poisoning by other opioids, accidental (unintentional), initial encounter: Secondary | ICD-10-CM | POA: Insufficient documentation

## 2016-06-10 DIAGNOSIS — I10 Essential (primary) hypertension: Secondary | ICD-10-CM

## 2016-06-10 DIAGNOSIS — T40601A Poisoning by unspecified narcotics, accidental (unintentional), initial encounter: Secondary | ICD-10-CM

## 2016-06-10 DIAGNOSIS — E119 Type 2 diabetes mellitus without complications: Secondary | ICD-10-CM | POA: Insufficient documentation

## 2016-06-10 DIAGNOSIS — Z7982 Long term (current) use of aspirin: Secondary | ICD-10-CM

## 2016-06-10 NOTE — ED Triage Notes (Signed)
Per [facility]: pt wife found pt on floor unresponsive, stiff. Pt found with snoring respirations, diaphoretic and unresponsive. 1 mg narcan IV, pt alert and oriented upon arrival. 169/90, 114 HR with EMS. Hx DM, HTN

## 2016-06-11 ENCOUNTER — Emergency Department: Payer: BC Managed Care – PPO

## 2016-06-11 ENCOUNTER — Encounter: Payer: Self-pay | Admitting: Emergency Medicine

## 2016-06-11 ENCOUNTER — Inpatient Hospital Stay
Admission: EM | Admit: 2016-06-11 | Discharge: 2016-06-14 | DRG: 896 | Disposition: A | Discharge status: Home / Self Care | Payer: BC Managed Care – PPO | Attending: Internal Medicine | Admitting: Internal Medicine

## 2016-06-11 DIAGNOSIS — R410 Disorientation, unspecified: Secondary | ICD-10-CM

## 2016-06-11 DIAGNOSIS — T402X1A Poisoning by other opioids, accidental (unintentional), initial encounter: Secondary | ICD-10-CM | POA: Diagnosis present

## 2016-06-11 DIAGNOSIS — E291 Testicular hypofunction: Secondary | ICD-10-CM | POA: Diagnosis present

## 2016-06-11 DIAGNOSIS — F1193 Opioid use, unspecified with withdrawal: Secondary | ICD-10-CM

## 2016-06-11 DIAGNOSIS — Z79899 Other long term (current) drug therapy: Secondary | ICD-10-CM

## 2016-06-11 DIAGNOSIS — E871 Hypo-osmolality and hyponatremia: Secondary | ICD-10-CM | POA: Diagnosis present

## 2016-06-11 DIAGNOSIS — E119 Type 2 diabetes mellitus without complications: Secondary | ICD-10-CM | POA: Diagnosis present

## 2016-06-11 DIAGNOSIS — Z8042 Family history of malignant neoplasm of prostate: Secondary | ICD-10-CM

## 2016-06-11 DIAGNOSIS — F1123 Opioid dependence with withdrawal: Principal | ICD-10-CM | POA: Diagnosis present

## 2016-06-11 DIAGNOSIS — F1510 Other stimulant abuse, uncomplicated: Secondary | ICD-10-CM | POA: Diagnosis present

## 2016-06-11 DIAGNOSIS — T507X5A Adverse effect of analeptics and opioid receptor antagonists, initial encounter: Secondary | ICD-10-CM | POA: Diagnosis present

## 2016-06-11 DIAGNOSIS — G4700 Insomnia, unspecified: Secondary | ICD-10-CM | POA: Diagnosis present

## 2016-06-11 DIAGNOSIS — Z9049 Acquired absence of other specified parts of digestive tract: Secondary | ICD-10-CM

## 2016-06-11 DIAGNOSIS — F1110 Opioid abuse, uncomplicated: Secondary | ICD-10-CM

## 2016-06-11 DIAGNOSIS — G92 Toxic encephalopathy: Secondary | ICD-10-CM | POA: Diagnosis present

## 2016-06-11 DIAGNOSIS — N401 Enlarged prostate with lower urinary tract symptoms: Secondary | ICD-10-CM | POA: Diagnosis present

## 2016-06-11 DIAGNOSIS — Z781 Physical restraint status: Secondary | ICD-10-CM

## 2016-06-11 DIAGNOSIS — Z7952 Long term (current) use of systemic steroids: Secondary | ICD-10-CM

## 2016-06-11 DIAGNOSIS — Z6833 Body mass index (BMI) 33.0-33.9, adult: Secondary | ICD-10-CM

## 2016-06-11 DIAGNOSIS — I4581 Long QT syndrome: Secondary | ICD-10-CM | POA: Diagnosis present

## 2016-06-11 DIAGNOSIS — Z888 Allergy status to other drugs, medicaments and biological substances status: Secondary | ICD-10-CM

## 2016-06-11 DIAGNOSIS — G4730 Sleep apnea, unspecified: Secondary | ICD-10-CM | POA: Diagnosis present

## 2016-06-11 DIAGNOSIS — R338 Other retention of urine: Secondary | ICD-10-CM | POA: Diagnosis present

## 2016-06-11 DIAGNOSIS — Z7984 Long term (current) use of oral hypoglycemic drugs: Secondary | ICD-10-CM

## 2016-06-11 DIAGNOSIS — I10 Essential (primary) hypertension: Secondary | ICD-10-CM | POA: Diagnosis present

## 2016-06-11 DIAGNOSIS — Z833 Family history of diabetes mellitus: Secondary | ICD-10-CM

## 2016-06-11 DIAGNOSIS — Z8249 Family history of ischemic heart disease and other diseases of the circulatory system: Secondary | ICD-10-CM

## 2016-06-11 DIAGNOSIS — Z7951 Long term (current) use of inhaled steroids: Secondary | ICD-10-CM

## 2016-06-11 DIAGNOSIS — F1910 Other psychoactive substance abuse, uncomplicated: Secondary | ICD-10-CM | POA: Diagnosis present

## 2016-06-11 DIAGNOSIS — Z885 Allergy status to narcotic agent status: Secondary | ICD-10-CM

## 2016-06-11 DIAGNOSIS — E669 Obesity, unspecified: Secondary | ICD-10-CM | POA: Diagnosis present

## 2016-06-11 DIAGNOSIS — F329 Major depressive disorder, single episode, unspecified: Secondary | ICD-10-CM | POA: Diagnosis present

## 2016-06-11 DIAGNOSIS — E876 Hypokalemia: Secondary | ICD-10-CM | POA: Diagnosis present

## 2016-06-11 DIAGNOSIS — Z7982 Long term (current) use of aspirin: Secondary | ICD-10-CM

## 2016-06-11 LAB — URINALYSIS, COMPLETE (UACMP) WITH MICROSCOPIC
BACTERIA UA: NONE SEEN
Bilirubin Urine: NEGATIVE
Glucose, UA: >=500 mg/dL — AB
HGB URINE DIPSTICK: NEGATIVE
Ketones, ur: NEGATIVE mg/dL
LEUKOCYTES UA: NEGATIVE
Nitrite: NEGATIVE
PROTEIN: NEGATIVE mg/dL
Specific Gravity, Urine: 1.006 (ref 1.005–1.030)
pH: 6 (ref 5.0–8.0)

## 2016-06-11 LAB — CBC WITH DIFFERENTIAL/PLATELET
BASOS PCT: 1 %
BASOS PCT: 1 %
Basophils Absolute: 0.1 10*3/uL (ref 0–0.1)
Basophils Absolute: 0.1 10*3/uL (ref 0–0.1)
EOS ABS: 0 10*3/uL (ref 0–0.7)
EOS PCT: 0 %
Eosinophils Absolute: 0.1 10*3/uL (ref 0–0.7)
Eosinophils Relative: 1 %
HCT: 45.7 % (ref 40.0–52.0)
HCT: 47.6 % (ref 40.0–52.0)
HEMOGLOBIN: 15.7 g/dL (ref 13.0–18.0)
HEMOGLOBIN: 16 g/dL (ref 13.0–18.0)
LYMPHS ABS: 1.3 10*3/uL (ref 1.0–3.6)
Lymphocytes Relative: 10 %
Lymphocytes Relative: 22 %
Lymphs Abs: 1.6 10*3/uL (ref 1.0–3.6)
MCH: 28.6 pg (ref 26.0–34.0)
MCH: 29.9 pg (ref 26.0–34.0)
MCHC: 33.5 g/dL (ref 32.0–36.0)
MCHC: 34.3 g/dL (ref 32.0–36.0)
MCV: 85.6 fL (ref 80.0–100.0)
MCV: 87.3 fL (ref 80.0–100.0)
MONOS PCT: 9 %
Monocytes Absolute: 0.7 10*3/uL (ref 0.2–1.0)
Monocytes Absolute: 1.2 10*3/uL — ABNORMAL HIGH (ref 0.2–1.0)
Monocytes Relative: 10 %
NEUTROS PCT: 66 %
NEUTROS PCT: 80 %
Neutro Abs: 10.7 10*3/uL — ABNORMAL HIGH (ref 1.4–6.5)
Neutro Abs: 4.8 10*3/uL (ref 1.4–6.5)
PLATELETS: 354 10*3/uL (ref 150–440)
Platelets: 273 10*3/uL (ref 150–440)
RBC: 5.24 MIL/uL (ref 4.40–5.90)
RBC: 5.57 MIL/uL (ref 4.40–5.90)
RDW: 13.6 % (ref 11.5–14.5)
RDW: 13.7 % (ref 11.5–14.5)
WBC: 13.3 10*3/uL — AB (ref 3.8–10.6)
WBC: 7.3 10*3/uL (ref 3.8–10.6)

## 2016-06-11 LAB — COMPREHENSIVE METABOLIC PANEL
ALBUMIN: 4.4 g/dL (ref 3.5–5.0)
ALBUMIN: 4.7 g/dL (ref 3.5–5.0)
ALK PHOS: 63 U/L (ref 38–126)
ALT: 34 U/L (ref 17–63)
ALT: 40 U/L (ref 17–63)
ANION GAP: 12 (ref 5–15)
AST: 39 U/L (ref 15–41)
AST: 46 U/L — ABNORMAL HIGH (ref 15–41)
Alkaline Phosphatase: 65 U/L (ref 38–126)
Anion gap: 14 (ref 5–15)
BUN: 13 mg/dL (ref 6–20)
BUN: 13 mg/dL (ref 6–20)
CALCIUM: 9.9 mg/dL (ref 8.9–10.3)
CHLORIDE: 101 mmol/L (ref 101–111)
CO2: 19 mmol/L — AB (ref 22–32)
CO2: 22 mmol/L (ref 22–32)
CREATININE: 1.51 mg/dL — AB (ref 0.61–1.24)
Calcium: 9.7 mg/dL (ref 8.9–10.3)
Chloride: 98 mmol/L — ABNORMAL LOW (ref 101–111)
Creatinine, Ser: 1.18 mg/dL (ref 0.61–1.24)
GFR calc Af Amer: >60 mL/min (ref >60)
GFR calc non Af Amer: 52 mL/min — ABNORMAL LOW (ref >60)
GFR calc non Af Amer: >60 mL/min (ref >60)
GLUCOSE: 168 mg/dL — AB (ref 65–99)
GLUCOSE: 268 mg/dL — AB (ref 65–99)
POTASSIUM: 3.2 mmol/L — AB (ref 3.5–5.1)
Potassium: 3.5 mmol/L (ref 3.5–5.1)
SODIUM: 132 mmol/L — AB (ref 135–145)
Sodium: 134 mmol/L — ABNORMAL LOW (ref 135–145)
Total Bilirubin: 1 mg/dL (ref 0.3–1.2)
Total Bilirubin: 1.6 mg/dL — ABNORMAL HIGH (ref 0.3–1.2)
Total Protein: 7.7 g/dL (ref 6.5–8.1)
Total Protein: 7.8 g/dL (ref 6.5–8.1)

## 2016-06-11 LAB — URINE DRUG SCREEN, QUALITATIVE (ARMC ONLY)
Amphetamines, Ur Screen: POSITIVE — AB
BARBITURATES, UR SCREEN: NOT DETECTED
BENZODIAZEPINE, UR SCRN: NOT DETECTED
CANNABINOID 50 NG, UR ~~LOC~~: NOT DETECTED
Cocaine Metabolite,Ur ~~LOC~~: NOT DETECTED
MDMA (ECSTASY) UR SCREEN: NOT DETECTED
Methadone Scn, Ur: NOT DETECTED
Opiate, Ur Screen: POSITIVE — AB
Phencyclidine (PCP) Ur S: NOT DETECTED
TRICYCLIC, UR SCREEN: NOT DETECTED

## 2016-06-11 LAB — TROPONIN I
Troponin I: <0.03 ng/mL (ref <0.03)
Troponin I: <0.03 ng/mL (ref <0.03)

## 2016-06-11 LAB — ACETAMINOPHEN LEVEL

## 2016-06-11 LAB — SALICYLATE LEVEL

## 2016-06-11 MED ORDER — MIDAZOLAM HCL 2 MG/2ML IJ SOLN
2.0000 mg | Freq: Once | INTRAMUSCULAR | Status: AC
  Administered 2016-06-12: 2 mg via INTRAVENOUS
  Filled 2016-06-11: qty 2

## 2016-06-11 MED ORDER — HYDROMORPHONE HCL 1 MG/ML IJ SOLN
0.5000 mg | INTRAMUSCULAR | Status: DC | PRN
  Administered 2016-06-11: 0.5 mg via INTRAVENOUS
  Filled 2016-06-11: qty 1

## 2016-06-11 MED ORDER — PROMETHAZINE HCL 25 MG/ML IJ SOLN
12.5000 mg | Freq: Once | INTRAMUSCULAR | Status: AC
Start: 2016-06-11 — End: 2016-06-11
  Administered 2016-06-11: 12.5 mg via INTRAVENOUS
  Filled 2016-06-11: qty 1

## 2016-06-11 MED ORDER — ACETAMINOPHEN 160 MG/5ML PO SUSP
ORAL | Status: AC
  Filled 2016-06-11: qty 10

## 2016-06-11 MED ORDER — SODIUM CHLORIDE 0.9 % IV BOLUS (SEPSIS)
1000.0000 mL | Freq: Once | INTRAVENOUS | Status: AC
  Administered 2016-06-11: 1000 mL via INTRAVENOUS

## 2016-06-11 MED ORDER — DEXMEDETOMIDINE HCL IN NACL 200 MCG/50ML IV SOLN
0.4000 ug/kg/h | INTRAVENOUS | Status: DC
  Administered 2016-06-11: 0.4 ug/kg/h via INTRAVENOUS
  Administered 2016-06-12 (×3): 1.5 ug/kg/h via INTRAVENOUS
  Filled 2016-06-11 (×3): qty 50

## 2016-06-11 MED ORDER — DIPHENHYDRAMINE HCL 50 MG/ML IJ SOLN
25.0000 mg | Freq: Once | INTRAMUSCULAR | Status: AC
  Administered 2016-06-12: 25 mg via INTRAVENOUS
  Filled 2016-06-11: qty 1

## 2016-06-11 MED ORDER — CLONIDINE HCL 0.1 MG PO TABS: 0.2000 mg | ORAL_TABLET | Freq: Once | ORAL | Status: DC

## 2016-06-11 MED ORDER — DEXMEDETOMIDINE HCL IN NACL 400 MCG/100ML IV SOLN
INTRAVENOUS | Status: AC
  Administered 2016-06-11: 0.4 ug/kg/h via INTRAVENOUS
  Filled 2016-06-11: qty 100

## 2016-06-11 MED ORDER — MAGNESIUM SULFATE 2 GM/50ML IV SOLN
2.0000 g | Freq: Once | INTRAVENOUS | Status: AC
Start: 2016-06-11 — End: 2016-06-11
  Administered 2016-06-11: 2 g via INTRAVENOUS
  Filled 2016-06-11: qty 50

## 2016-06-11 MED ORDER — GABAPENTIN 300 MG PO CAPS
400.0000 mg | ORAL_CAPSULE | Freq: Once | ORAL | Status: DC
  Filled 2016-06-11: qty 1

## 2016-06-11 MED ORDER — CLONIDINE HCL 0.1 MG PO TABS
0.2000 mg | ORAL_TABLET | Freq: Once | ORAL | Status: AC
  Administered 2016-06-11: 0.2 mg via ORAL
  Filled 2016-06-11: qty 2

## 2016-06-11 MED ORDER — LORAZEPAM 2 MG/ML IJ SOLN
INTRAMUSCULAR | Status: AC
  Administered 2016-06-11: 2 mg via INTRAVENOUS
  Filled 2016-06-11: qty 1

## 2016-06-11 MED ORDER — LORAZEPAM 2 MG/ML IJ SOLN
1.0000 mg | Freq: Once | INTRAMUSCULAR | Status: AC
Start: 2016-06-11 — End: 2016-06-11
  Administered 2016-06-11: 1 mg via INTRAVENOUS
  Filled 2016-06-11: qty 1

## 2016-06-11 MED ORDER — LORAZEPAM 2 MG/ML IJ SOLN
2.0000 mg | Freq: Once | INTRAMUSCULAR | Status: AC
  Administered 2016-06-11: 2 mg via INTRAVENOUS

## 2016-06-11 MED ORDER — DEXMEDETOMIDINE BOLUS VIA INFUSION
1.0000 ug/kg | Freq: Once | INTRAVENOUS | Status: AC
  Administered 2016-06-11: 99.8 ug via INTRAVENOUS
  Filled 2016-06-11: qty 100

## 2016-06-11 MED ORDER — LORAZEPAM 2 MG/ML IJ SOLN
2.0000 mg | Freq: Once | INTRAMUSCULAR | Status: AC
  Administered 2016-06-11: 2 mg via INTRAVENOUS
  Filled 2016-06-11: qty 1

## 2016-06-11 NOTE — ED Notes (Addendum)
Pt is very agitated. MD was notified 2 sitters and safety pads were placed at bedside to ensure Pt safety. Vital signs are being difficult to obtain since the Pt is restless. Medication has been ordered to help the Pt calm down.

## 2016-06-11 NOTE — ED Provider Notes (Signed)
[facility] Emergency Department Provider Note    None    (approximate)  I have reviewed the triage vital signs and the nursing notes.   HISTORY  Chief Complaint Withdrawal   HPI Micheal Silva is a 51 y.o. male pleases with shortness of breath right-sided abdominal painfeeling that he is about to pass out. 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 Patient states that he was prescribed a medication yesterday, Naltrexone, and he took so these medications today without help with his symptoms as he was well-nourished use. Denies any SI or HI. Patient is very agitated does admit to some chest discomfort and shortness of breath.   Past Medical History:  Diagnosis Date  . Arthritis    HANDS BIL  . BPH (benign prostatic hyperplasia)   . Diabetes mellitus ([place])   . Difficult intubation   . Heart murmur   . HTN (hypertension)   . Hypogonadism in male   . Insomnia   . Obesity   . Right-sided chest wall pain 12/25/2015  . Sleep apnea    NO CPAP X 6 MONTHS   Family History  Problem Relation Age of Onset  . Prostate cancer Father   . Leukemia Father   . Diabetes Mellitus II    . Coronary artery disease    . Hypertension    . Breast cancer Mother    Past Surgical History:  Procedure Laterality Date  . APPENDECTOMY  2006   Dr. Tamala Julian  . CHOLECYSTECTOMY  2002   [facility]-Dr. Tamala Julian  . COLONOSCOPY  01/01/2016  . COLONOSCOPY WITH PROPOFOL N/A 01/01/2016   Procedure: COLONOSCOPY WITH PROPOFOL;  Surgeon: [name], MD;  Location: [facility] ENDOSCOPY;  Service: Endoscopy;  Laterality: N/A;  . corrective surgery for sleep apnea  2005  . ESOPHAGOGASTRODUODENOSCOPY (EGD) WITH PROPOFOL N/A 01/01/2016   Procedure: ESOPHAGOGASTRODUODENOSCOPY (EGD) WITH PROPOFOL;  Surgeon: [name], MD;  Location: [facility] ENDOSCOPY;  Service: Endoscopy;  Laterality: N/A;  . LAPAROSCOPIC LYSIS OF ADHESIONS  01/25/2016   Procedure:  LAPAROSCOPIC LYSIS OF ADHESIONS;  Surgeon: [name], MD;  Location: [facility] ORS;  Service: General;;  . LAPAROSCOPY N/A 01/25/2016   Procedure: LAPAROSCOPY DIAGNOSTIC;  Surgeon: [name], MD;  Location: [facility] ORS;  Service: General;  Laterality: N/A;  . TONSILLECTOMY  07/2003   palate also removed   Patient Active Problem List   Diagnosis Date Noted  . Opiate withdrawal ([place]) 06/12/2016  . Abdominal pain, chronic, right upper quadrant 12/25/2015  . Right-sided chest wall pain 12/25/2015  . BPH with obstruction/lower urinary tract symptoms 12/27/2014  . Hypogonadism in male 12/27/2014  . Erectile dysfunction of organic origin 12/27/2014      Prior to Admission medications   Medication Sig Start Date End Date Taking? Authorizing Provider  aspirin 81 MG chewable tablet Chew by mouth.    Historical Provider, MD  buPROPion (WELLBUTRIN XL) 150 MG 24 hr tablet take 300 mg in am and take 150 mg in evening 11/18/14   Historical Provider, MD  cloNIDine (CATAPRES) 0.1 MG tablet Take 0.1 mg by mouth 2 (two) times daily.    Historical Provider, MD  fluticasone Asencion Islam) 50 MCG/ACT nasal spray 1 spray by Each Nare route PRN 05/18/14 01/22/16  Historical Provider, MD  gabapentin (NEURONTIN) 600 MG tablet Take 1 tablet by mouth 3 (three) times daily. 05/12/16   Historical Provider, MD  losartan (COZAAR) 50 MG tablet Take 100 mg by  mouth every morning.     Historical Provider, MD  losartan-hydrochlorothiazide (HYZAAR) 100-25 MG tablet Take 1 tablet by mouth daily. 04/29/16   Historical Provider, MD  metFORMIN (GLUCOPHAGE) 1000 MG tablet TAKE 1 TABLET (1,000 MG TOTAL) BY MOUTH 2 (TWO) TIMES DAILY WITH MEALS. 11/18/14   Historical Provider, MD  metFORMIN (GLUCOPHAGE-XR) 500 MG 24 hr tablet Take 2 tablets by mouth 2 (two) times daily. 05/12/16   Historical Provider, MD  naltrexone (DEPADE) 50 MG tablet Take 50 mg by mouth at bedtime.  11/21/14   Historical Provider, MD  Probiotic Product (PROBIOTIC  DAILY PO) Take by mouth.    Historical Provider, MD  sertraline (ZOLOFT) 50 MG tablet Take 1 tablet by mouth daily. 05/17/16   Historical Provider, MD  testosterone cypionate (DEPOTESTOSTERONE CYPIONATE) 200 MG/ML injection Inject 1 mL (200 mg total) into the muscle every 14 (fourteen) days. 12/26/14   Nori Riis, PA-C  traMADol (ULTRAM) 50 MG tablet Take 1 tablet (50 mg total) by mouth every 4 (four) hours as needed for moderate pain or severe pain. 01/25/16   [name], MD    Allergies Vicodin [hydrocodone-acetaminophen] and Atorvastatin    Social History Social History  Substance Use Topics  . Smoking status: Never Smoker  . Smokeless tobacco: Never Used  . Alcohol use No    Review of Systems Patient denies headaches, rhinorrhea, blurry vision, numbness, shortness of breath, chest pain, edema, cough, abdominal pain, nausea, vomiting, diarrhea, dysuria, fevers, rashes or hallucinations unless otherwise stated above in HPI. ____________________________________________   PHYSICAL EXAM:  VITAL SIGNS: Vitals:   06/11/16 2140 06/11/16 2336  BP: 121/77   Pulse:  (!) 104  Resp:    Temp:      Constitutional: Alert, agitated, tremulous. Unable to sit still, hyperventilating Eyes: Conjunctivae are normal. Pupils are 21mm and reactive bilaterally. EOMI. Head: Atraumatic. Nose: No congestion/rhinnorhea. Mouth/Throat: Mucous membranes are moist.  Oropharynx non-erythematous. Neck: No stridor. Painless ROM. No cervical spine tenderness to palpation Hematological/Lymphatic/Immunilogical: No cervical lymphadenopathy. Cardiovascular: Normal rate, regular rhythm. Grossly normal heart sounds.  Good peripheral circulation. Respiratory: Normal respiratory effort.  No retractions. Lungs with coarse breath sounds throughout Gastrointestinal: Soft and nontender. No distention. No abdominal bruits. No CVA tenderness. Hyperactive bowel sounds Musculoskeletal: No lower extremity  tenderness nor edema Neurologic:  Agitated and tremulous, MAE spontaneously, no facial droop.   ____________________________________________   LABS (all labs ordered are listed, but only abnormal results are displayed)  Results for orders placed or performed during the hospital encounter of 06/11/16 (from the past 24 hour(s))  Troponin I     Status: None   Collection Time: 06/11/16  9:21 PM  Result Value Ref Range   Troponin I <0.03 <0.03 ng/mL  CBC with Differential/Platelet     Status: Abnormal   Collection Time: 06/11/16  9:21 PM  Result Value Ref Range   WBC 13.3 (H) 3.8 - 10.6 K/uL   RBC 5.57 4.40 - 5.90 MIL/uL   Hemoglobin 16.0 13.0 - 18.0 g/dL   HCT 47.6 40.0 - 52.0 %   MCV 85.6 80.0 - 100.0 fL   MCH 28.6 26.0 - 34.0 pg   MCHC 33.5 32.0 - 36.0 g/dL   RDW 13.6 11.5 - 14.5 %   Platelets 354 150 - 440 K/uL   Neutrophils Relative % 80 %   Neutro Abs 10.7 (H) 1.4 - 6.5 K/uL   Lymphocytes Relative 10 %   Lymphs Abs 1.3 1.0 - 3.6 K/uL  Monocytes Relative 9 %   Monocytes Absolute 1.2 (H) 0.2 - 1.0 K/uL   Eosinophils Relative 0 %   Eosinophils Absolute 0.1 0 - 0.7 K/uL   Basophils Relative 1 %   Basophils Absolute 0.1 0 - 0.1 K/uL  Comprehensive metabolic panel     Status: Abnormal   Collection Time: 06/11/16  9:21 PM  Result Value Ref Range   Sodium 134 (L) 135 - 145 mmol/L   Potassium 3.2 (L) 3.5 - 5.1 mmol/L   Chloride 101 101 - 111 mmol/L   CO2 19 (L) 22 - 32 mmol/L   Glucose, Bld 168 (H) 65 - 99 mg/dL   BUN 13 6 - 20 mg/dL   Creatinine, Ser 1.18 0.61 - 1.24 mg/dL   Calcium 9.7 8.9 - 10.3 mg/dL   Total Protein 7.8 6.5 - 8.1 g/dL   Albumin 4.7 3.5 - 5.0 g/dL   AST 46 (H) 15 - 41 U/L   ALT 40 17 - 63 U/L   Alkaline Phosphatase 65 38 - 126 U/L   Total Bilirubin 1.6 (H) 0.3 - 1.2 mg/dL   GFR calc non Af Amer >60 >60 mL/min   GFR calc Af Amer >60 >60 mL/min   Anion gap 14 5 - 15   ____________________________________________  EKG My review and personal  interpretation at Time: 21:14   Indication: withdrawal, chest pain  Rate: 95  Rhythm: sinus Axis: normal Other: t wave inversions in inferio lateral distribution, no STEMI ____________________________________________  RADIOLOGY  I personally reviewed all radiographic images ordered to evaluate for the above acute complaints and reviewed radiology reports and findings.  These findings were personally discussed with the patient.  Please see medical record for radiology report. ____________________________________________   PROCEDURES  Procedure(s) performed:  Procedures    Critical Care performed: yes CRITICAL CARE Performed by: Merlyn Lot   Total critical care time: 50 minutes  Critical care time was exclusive of separately billable procedures and treating other patients.  Critical care was necessary to treat or prevent imminent or life-threatening deterioration.  Critical care was time spent personally by me on the following activities: development of treatment plan with patient and/or surrogate as well as nursing, discussions with consultants, evaluation of patient's response to treatment, examination of patient, obtaining history from patient or surrogate, ordering and performing treatments and interventions, ordering and review of laboratory studies, ordering and review of radiographic studies, pulse oximetry and re-evaluation of patient's condition.  ____________________________________________   INITIAL IMPRESSION / ASSESSMENT AND PLAN / ED COURSE  Pertinent labs & imaging results that were available during my care of the patient were reviewed by me and considered in my medical decision making (see chart for details).  DDX: withdrawal, acs, pna, electrolyte abnormality, dehydration  Micheal Silva is a 51 y.o. who presents to the ED with acute agitation and altered mental status concerning for acute withdrawal area and given recent history of heroin overdose and  patient presented with bottle of naltrexone stating he took most of these medications do believe this is secondary to self-induced withdrawal. EKG does show some evidence of nonspecific T-wave changes which very likely could be withdrawal induced. Will further evaluation with laboratory evaluation.  The patient will be placed on continuous pulse oximetry and telemetry for monitoring.  Laboratory evaluation will be sent to evaluate for the above complaints.     Clinical Course as of Jun 13 19  Tue Jun 11, 2016  2231 Patient persistently agitated and trying to get  out of bed.  Will give additional ativan and trial of dilaudid to see if there is any improvement in symptoms.  [PR]  2313 Patient becoming more aggressive and agitated despite multiple doses of benzodiazepines, Dilaudid, Phenergan again and clonidine. Will start Precedex.  [PR]    Clinical Course User Index [PR] Merlyn Lot, MD  Patient remains agitated and delirious. Fairly pleasant however and remains hemodynamically stable. Some improvement with Precedex drip. I spoke with Dr. Ashby Dawes who agrees to consult on patient but is recommended admission to the hospitalist service. I do feel patient should be an ICU based on his agitation and monitoring requirements.  I spoke with Dr. Ara Kussmaul who agrees to admit patient for further evaluation and management.   ____________________________________________   FINAL CLINICAL IMPRESSION(S) / ED DIAGNOSES  Final diagnoses:  Withdrawal from opioids ([place])  Delirium      NEW MEDICATIONS STARTED DURING THIS VISIT:  New Prescriptions   No medications on file     Note:  This document was prepared using Dragon voice recognition software and may include unintentional dictation errors.    Merlyn Lot, MD 06/12/16 [phone]

## 2016-06-11 NOTE — ED Provider Notes (Signed)
[facility] Emergency Department Provider Note  ____________________________________________   First MD Initiated Contact with Patient 06/11/16 0009     (approximate)  I have reviewed the triage vital signs and the nursing notes.   HISTORY  Chief Complaint Drug Overdose   HPI OCIE KUPER is a 51 y.o. male with a history of heroin abuse who is presenting to the emergency department with altered mental status. Per EMS, he was found at home with decreased responsiveness. He was given 1 mg of intranasal Narcan and he returned to his baseline mental status. He is denying any pain at this time. Says he does not remember taking any drugs other tightness not able to recall the last time he snorted heroin. He denies need pills or injection drug use. Says that he is a diabetic. He is not reporting any suicidal or homicidal ideation.   Past Medical History:  Diagnosis Date  . Arthritis    HANDS BIL  . BPH (benign prostatic hyperplasia)   . Diabetes mellitus ([place])   . Difficult intubation   . Heart murmur   . HTN (hypertension)   . Hypogonadism in male   . Insomnia   . Obesity   . Right-sided chest wall pain 12/25/2015  . Sleep apnea    NO CPAP X 6 MONTHS    Patient Active Problem List   Diagnosis Date Noted  . Abdominal pain, chronic, right upper quadrant 12/25/2015  . Right-sided chest wall pain 12/25/2015  . BPH with obstruction/lower urinary tract symptoms 12/27/2014  . Hypogonadism in male 12/27/2014  . Erectile dysfunction of organic origin 12/27/2014    Past Surgical History:  Procedure Laterality Date  . APPENDECTOMY  2006   Dr. Tamala Julian  . CHOLECYSTECTOMY  2002   [facility]-Dr. Tamala Julian  . COLONOSCOPY  01/01/2016  . COLONOSCOPY WITH PROPOFOL N/A 01/01/2016   Procedure: COLONOSCOPY WITH PROPOFOL;  Surgeon: [name], MD;  Location: [facility] ENDOSCOPY;  Service: Endoscopy;  Laterality: N/A;  . corrective surgery for sleep apnea  2005  .  ESOPHAGOGASTRODUODENOSCOPY (EGD) WITH PROPOFOL N/A 01/01/2016   Procedure: ESOPHAGOGASTRODUODENOSCOPY (EGD) WITH PROPOFOL;  Surgeon: [name], MD;  Location: [facility] ENDOSCOPY;  Service: Endoscopy;  Laterality: N/A;  . LAPAROSCOPIC LYSIS OF ADHESIONS  01/25/2016   Procedure: LAPAROSCOPIC LYSIS OF ADHESIONS;  Surgeon: [name], MD;  Location: [facility] ORS;  Service: General;;  . LAPAROSCOPY N/A 01/25/2016   Procedure: LAPAROSCOPY DIAGNOSTIC;  Surgeon: [name], MD;  Location: [facility] ORS;  Service: General;  Laterality: N/A;  . TONSILLECTOMY  07/2003   palate also removed    Prior to Admission medications   Medication Sig Start Date End Date Taking? Authorizing Provider  aspirin 81 MG chewable tablet Chew by mouth.    Historical Provider, MD  buPROPion (WELLBUTRIN XL) 150 MG 24 hr tablet take 300 mg in am and take 150 mg in evening 11/18/14   Historical Provider, MD  cloNIDine (CATAPRES) 0.1 MG tablet Take 0.1 mg by mouth 2 (two) times daily.    Historical Provider, MD  fluticasone Asencion Islam) 50 MCG/ACT nasal spray 1 spray by Each Nare route PRN 05/18/14 01/22/16  Historical Provider, MD  gabapentin (NEURONTIN) 300 MG capsule Take 300 mg by mouth 3 (three) times daily.    Historical Provider, MD  losartan (COZAAR) 50 MG tablet Take 100 mg by mouth every morning.     Historical Provider, MD  metFORMIN (GLUCOPHAGE) 1000 MG tablet TAKE 1 TABLET (1,000 MG TOTAL) BY  MOUTH 2 (TWO) TIMES DAILY WITH MEALS. 11/18/14   Historical Provider, MD  naltrexone (DEPADE) 50 MG tablet Take 50 mg by mouth at bedtime.  11/21/14   Historical Provider, MD  Probiotic Product (PROBIOTIC DAILY PO) Take by mouth.    Historical Provider, MD  testosterone cypionate (DEPOTESTOSTERONE CYPIONATE) 200 MG/ML injection Inject 1 mL (200 mg total) into the muscle every 14 (fourteen) days. 12/26/14   Nori Riis, PA-C  traMADol (ULTRAM) 50 MG tablet Take 1 tablet (50 mg total) by mouth every 4 (four) hours as needed  for moderate pain or severe pain. 01/25/16   [name], MD    Allergies Vicodin [hydrocodone-acetaminophen] and Atorvastatin  Family History  Problem Relation Age of Onset  . Prostate cancer Father   . Leukemia Father   . Diabetes Mellitus II    . Coronary artery disease    . Hypertension    . Breast cancer Mother     Social History Social History  Substance Use Topics  . Smoking status: Never Smoker  . Smokeless tobacco: Never Used  . Alcohol use No    Review of Systems Constitutional: No fever/chills Eyes: No visual changes. ENT: No sore throat. Cardiovascular: Denies chest pain. Respiratory: Denies shortness of breath. Gastrointestinal: No abdominal pain.  No nausea, no vomiting.  No diarrhea.  No constipation. Genitourinary: Negative for dysuria. Musculoskeletal: Negative for back pain. Skin: Negative for rash. Neurological: Negative for headaches, focal weakness or numbness.  10-point ROS otherwise negative.  ____________________________________________   PHYSICAL EXAM:  VITAL SIGNS: ED Triage Vitals  Enc Vitals Group     BP 06/11/16 0004 128/77     Pulse Rate 06/11/16 0004 (!) 111     Resp 06/11/16 0004 20     Temp 06/11/16 0004 97.6 F (36.4 C)     Temp Source 06/11/16 0004 Oral     SpO2 06/11/16 0004 100 %     Weight 06/11/16 0008 220 lb (99.8 kg)     Height 06/11/16 0008 5\' 9"  (1.753 m)     Head Circumference --      Peak Flow --      Pain Score --      Pain Loc --      Pain Edu? --      Excl. in [place]? --     Constitutional: Alert and oriented. in no acute distress. Eyes: Conjunctivae are normal. PERRL. EOMI. Head: Atraumatic. Nose: No congestion/rhinnorhea. Mouth/Throat: Mucous membranes are moist.  Neck: No stridor.   Cardiovascular: Tachycardic, regular rhythm. Grossly normal heart sounds.   Respiratory: Mild tachypnea but without retractions. Lungs CTAB. Speaks in full sentences. Gastrointestinal: Soft and nontender. No  distention.  Musculoskeletal: No lower extremity tenderness nor edema.  No joint effusions. Neurologic:  Normal speech and language. No gross focal neurologic deficits are appreciated.  Skin:  Skin is warm, dry and intact. No rash noted. No track marks. Psychiatric: Mood and affect are normal. Speech and behavior are normal.  ____________________________________________   LABS (all labs ordered are listed, but only abnormal results are displayed)  Labs Reviewed  COMPREHENSIVE METABOLIC PANEL - Abnormal; Notable for the following:       Result Value   Sodium 132 (*)    Chloride 98 (*)    Glucose, Bld 268 (*)    Creatinine, Ser 1.51 (*)    GFR calc non Af Amer 52 (*)    All other components within normal limits  URINALYSIS, COMPLETE (UACMP) WITH MICROSCOPIC -  Abnormal; Notable for the following:    Color, Urine YELLOW (*)    APPearance CLEAR (*)    Glucose, UA >=500 (*)    Squamous Epithelial / LPF 0-5 (*)    All other components within normal limits  URINE DRUG SCREEN, QUALITATIVE ([facility] ONLY) - Abnormal; Notable for the following:    Amphetamines, Ur Screen POSITIVE (*)    Opiate, Ur Screen POSITIVE (*)    All other components within normal limits  ACETAMINOPHEN LEVEL - Abnormal; Notable for the following:    Acetaminophen (Tylenol), Serum <10 (*)    All other components within normal limits  CBC WITH DIFFERENTIAL/PLATELET  SALICYLATE LEVEL  TROPONIN I   ____________________________________________  EKG  ED ECG REPORT I, Doran Stabler, the attending physician, personally viewed and interpreted this ECG.   Date: 06/11/2016  EKG Time: 0002  Rate: 110  Rhythm: sinus tachycardia  Axis: Right axis  Intervals:none  ST&T Change: T wave inversions in 3, aVF as well as biphasic T waves in V5 and V6. No ST elevations or depressions. No significant change from the EKG of 05/17/2014. ____________________________________________  RADIOLOGY    DG Chest 2 View (Final  result)  Result time 06/11/16 00:45:02  Final result by Delphina Cahill, MD (06/11/16 00:45:02)           Narrative:   CLINICAL DATA: Dyspnea for a few hours tonight.  EXAM: CHEST 2 VIEW  COMPARISON: 05/17/2014  FINDINGS: The heart size and mediastinal contours are within normal limits. Both lungs are clear. The visualized skeletal structures are unremarkable.  IMPRESSION: No active cardiopulmonary disease.   Electronically Signed By: Andreas Newport M.D. On: 06/11/2016 00:45          ____________________________________________   PROCEDURES  Procedure(s) performed:   Procedures  Critical Care performed:   ____________________________________________   INITIAL IMPRESSION / ASSESSMENT AND PLAN / ED COURSE  Pertinent labs & imaging results that were available during my care of the patient were reviewed by me and considered in my medical decision making (see chart for details).  Clinically the patient has had an opiate overdose with resolution after Narcan. We'll continue to observe him in the emergency department for several hours for reassessment.    ----------------------------------------- 4:32 AM on 06/11/2016 -----------------------------------------  Patient resting heart rate now 104 but rises to the 1 teens when I entered the room. Tested positive for opiates although he does not remember taking any opiates substances. He says that he has an and a sponsor and has been clean for 18 months. I counseled him to avoid any opiate containing products in all illegal drugs. He is understanding of this plan and willing to comply. Has not needed any re-dosing of Narcan. Is mentating now without any complaints. He'll be discharged to home. His wife will be taking him home and will be supervising him over the next several hours. Also positive for amphetamines but unclear etiology of that.  ____________________________________________   FINAL CLINICAL  IMPRESSION(S) / ED DIAGNOSES  Opiate overdose.    NEW MEDICATIONS STARTED DURING THIS VISIT:  New Prescriptions   No medications on file     Note:  This document was prepared using Dragon voice recognition software and may include unintentional dictation errors.    Orbie Pyo, MD 06/11/16 [phone]

## 2016-06-11 NOTE — ED Triage Notes (Signed)
Pt arrived to the ED via EMS from home after taking Narcan. Pt reports that he was in this ED yesterday for an accidental drug overdose. He was prescribed a medication which he took too much of today and felt like he was going to pass out because of the medicatoin, so he took narcan. Since he is an opiate user he is having withdrawal symptoms. Pt is extremely agitated, ED MD at bedside.

## 2016-06-11 NOTE — ED Notes (Signed)
Despite multiple medications the Pt continues to be restless. Safety sitter continue at bedside to ensure safety. Vital signs are incomplete since the they difficult to obtain because the Pt is restless.

## 2016-06-11 NOTE — ED Triage Notes (Signed)
Pt admits hx drug abuse.

## 2016-06-12 ENCOUNTER — Encounter: Payer: Self-pay | Admitting: *Deleted

## 2016-06-12 DIAGNOSIS — T507X5A Adverse effect of analeptics and opioid receptor antagonists, initial encounter: Secondary | ICD-10-CM | POA: Diagnosis present

## 2016-06-12 DIAGNOSIS — I4581 Long QT syndrome: Secondary | ICD-10-CM | POA: Diagnosis present

## 2016-06-12 DIAGNOSIS — F1123 Opioid dependence with withdrawal: Secondary | ICD-10-CM | POA: Diagnosis present

## 2016-06-12 DIAGNOSIS — F19231 Other psychoactive substance dependence with withdrawal delirium: Secondary | ICD-10-CM | POA: Diagnosis not present

## 2016-06-12 DIAGNOSIS — G4730 Sleep apnea, unspecified: Secondary | ICD-10-CM | POA: Diagnosis present

## 2016-06-12 DIAGNOSIS — G4700 Insomnia, unspecified: Secondary | ICD-10-CM | POA: Diagnosis present

## 2016-06-12 DIAGNOSIS — R338 Other retention of urine: Secondary | ICD-10-CM | POA: Diagnosis present

## 2016-06-12 DIAGNOSIS — E119 Type 2 diabetes mellitus without complications: Secondary | ICD-10-CM | POA: Diagnosis present

## 2016-06-12 DIAGNOSIS — E669 Obesity, unspecified: Secondary | ICD-10-CM | POA: Diagnosis present

## 2016-06-12 DIAGNOSIS — F1510 Other stimulant abuse, uncomplicated: Secondary | ICD-10-CM | POA: Diagnosis present

## 2016-06-12 DIAGNOSIS — F1910 Other psychoactive substance abuse, uncomplicated: Secondary | ICD-10-CM | POA: Diagnosis present

## 2016-06-12 DIAGNOSIS — F329 Major depressive disorder, single episode, unspecified: Secondary | ICD-10-CM | POA: Diagnosis present

## 2016-06-12 DIAGNOSIS — F1193 Opioid use, unspecified with withdrawal: Secondary | ICD-10-CM | POA: Diagnosis present

## 2016-06-12 DIAGNOSIS — F1110 Opioid abuse, uncomplicated: Secondary | ICD-10-CM | POA: Diagnosis not present

## 2016-06-12 DIAGNOSIS — E876 Hypokalemia: Secondary | ICD-10-CM | POA: Diagnosis present

## 2016-06-12 DIAGNOSIS — I10 Essential (primary) hypertension: Secondary | ICD-10-CM | POA: Diagnosis present

## 2016-06-12 DIAGNOSIS — Z833 Family history of diabetes mellitus: Secondary | ICD-10-CM | POA: Diagnosis not present

## 2016-06-12 DIAGNOSIS — Z8249 Family history of ischemic heart disease and other diseases of the circulatory system: Secondary | ICD-10-CM | POA: Diagnosis not present

## 2016-06-12 DIAGNOSIS — E871 Hypo-osmolality and hyponatremia: Secondary | ICD-10-CM | POA: Diagnosis present

## 2016-06-12 DIAGNOSIS — Z781 Physical restraint status: Secondary | ICD-10-CM | POA: Diagnosis not present

## 2016-06-12 DIAGNOSIS — R4182 Altered mental status, unspecified: Secondary | ICD-10-CM | POA: Diagnosis present

## 2016-06-12 DIAGNOSIS — Z8042 Family history of malignant neoplasm of prostate: Secondary | ICD-10-CM | POA: Diagnosis not present

## 2016-06-12 DIAGNOSIS — Z6833 Body mass index (BMI) 33.0-33.9, adult: Secondary | ICD-10-CM | POA: Diagnosis not present

## 2016-06-12 DIAGNOSIS — E291 Testicular hypofunction: Secondary | ICD-10-CM | POA: Diagnosis present

## 2016-06-12 DIAGNOSIS — G92 Toxic encephalopathy: Secondary | ICD-10-CM | POA: Diagnosis present

## 2016-06-12 DIAGNOSIS — N401 Enlarged prostate with lower urinary tract symptoms: Secondary | ICD-10-CM | POA: Diagnosis present

## 2016-06-12 DIAGNOSIS — T402X1A Poisoning by other opioids, accidental (unintentional), initial encounter: Secondary | ICD-10-CM | POA: Diagnosis present

## 2016-06-12 DIAGNOSIS — Z9049 Acquired absence of other specified parts of digestive tract: Secondary | ICD-10-CM | POA: Diagnosis not present

## 2016-06-12 LAB — CBC
HEMATOCRIT: 44.9 % (ref 40.0–52.0)
HEMOGLOBIN: 15.3 g/dL (ref 13.0–18.0)
MCH: 29.7 pg (ref 26.0–34.0)
MCHC: 34.2 g/dL (ref 32.0–36.0)
MCV: 87.1 fL (ref 80.0–100.0)
Platelets: 300 10*3/uL (ref 150–440)
RBC: 5.15 MIL/uL (ref 4.40–5.90)
RDW: 13.4 % (ref 11.5–14.5)
WBC: 12.7 10*3/uL — AB (ref 3.8–10.6)

## 2016-06-12 LAB — GLUCOSE, CAPILLARY
GLUCOSE-CAPILLARY: 149 mg/dL — AB (ref 65–99)
GLUCOSE-CAPILLARY: 165 mg/dL — AB (ref 65–99)
GLUCOSE-CAPILLARY: 169 mg/dL — AB (ref 65–99)
GLUCOSE-CAPILLARY: 203 mg/dL — AB (ref 65–99)
GLUCOSE-CAPILLARY: 218 mg/dL — AB (ref 65–99)
Glucose-Capillary: 198 mg/dL — ABNORMAL HIGH (ref 65–99)
Glucose-Capillary: 159 mg/dL — ABNORMAL HIGH (ref 65–99)
Glucose-Capillary: 150 mg/dL — ABNORMAL HIGH (ref 65–99)

## 2016-06-12 LAB — COMPREHENSIVE METABOLIC PANEL
ALBUMIN: 4 g/dL (ref 3.5–5.0)
ALT: 35 U/L (ref 17–63)
ANION GAP: 8 (ref 5–15)
AST: 38 U/L (ref 15–41)
Alkaline Phosphatase: 58 U/L (ref 38–126)
BILIRUBIN TOTAL: 1.3 mg/dL — AB (ref 0.3–1.2)
BUN: 14 mg/dL (ref 6–20)
CHLORIDE: 105 mmol/L (ref 101–111)
CO2: 22 mmol/L (ref 22–32)
Calcium: 8.9 mg/dL (ref 8.9–10.3)
Creatinine, Ser: 0.81 mg/dL (ref 0.61–1.24)
GFR calc Af Amer: >60 mL/min (ref >60)
GFR calc non Af Amer: >60 mL/min (ref >60)
GLUCOSE: 197 mg/dL — AB (ref 65–99)
POTASSIUM: 4.1 mmol/L (ref 3.5–5.1)
SODIUM: 135 mmol/L (ref 135–145)
TOTAL PROTEIN: 6.7 g/dL (ref 6.5–8.1)

## 2016-06-12 LAB — TROPONIN I

## 2016-06-12 LAB — MRSA PCR SCREENING: MRSA by PCR: NEGATIVE

## 2016-06-12 LAB — LIPID PANEL
Cholesterol: 152 mg/dL (ref 0–200)
HDL: 37 mg/dL — ABNORMAL LOW (ref >40)
LDL Cholesterol: 102 mg/dL — ABNORMAL HIGH (ref 0–99)
Total CHOL/HDL Ratio: 4.1 RATIO
Triglycerides: 64 mg/dL (ref <150)
VLDL: 13 mg/dL (ref 0–40)

## 2016-06-12 LAB — MAGNESIUM: Magnesium: 2 mg/dL (ref 1.7–2.4)

## 2016-06-12 MED ORDER — ENOXAPARIN SODIUM 40 MG/0.4ML ~~LOC~~ SOLN
40.0000 mg | SUBCUTANEOUS | Status: DC
  Administered 2016-06-12 – 2016-06-13 (×2): 40 mg via SUBCUTANEOUS
  Filled 2016-06-12 (×2): qty 0.4

## 2016-06-12 MED ORDER — IPRATROPIUM BROMIDE 0.02 % IN SOLN: 0.5000 mg | Freq: Four times a day (QID) | RESPIRATORY_TRACT | Status: DC | PRN

## 2016-06-12 MED ORDER — LORAZEPAM 2 MG/ML IJ SOLN
1.0000 mg | INTRAMUSCULAR | Status: DC | PRN
  Administered 2016-06-12 (×3): 2 mg via INTRAVENOUS
  Filled 2016-06-12 (×3): qty 1

## 2016-06-12 MED ORDER — LORAZEPAM 2 MG/ML IJ SOLN
1.0000 mg | INTRAMUSCULAR | Status: DC | PRN
  Administered 2016-06-12: 1 mg via INTRAVENOUS
  Filled 2016-06-12: qty 1

## 2016-06-12 MED ORDER — HYDRALAZINE HCL 20 MG/ML IJ SOLN: 10.0000 mg | Freq: Four times a day (QID) | INTRAMUSCULAR | Status: DC | PRN

## 2016-06-12 MED ORDER — INSULIN ASPART 100 UNIT/ML ~~LOC~~ SOLN
2.0000 [IU] | SUBCUTANEOUS | Status: DC
  Administered 2016-06-12: 6 [IU] via SUBCUTANEOUS
  Administered 2016-06-12: 4 [IU] via SUBCUTANEOUS
  Filled 2016-06-12: qty 4
  Filled 2016-06-12: qty 6

## 2016-06-12 MED ORDER — SODIUM CHLORIDE 0.9% FLUSH
3.0000 mL | Freq: Two times a day (BID) | INTRAVENOUS | Status: DC
  Administered 2016-06-12 – 2016-06-14 (×6): 3 mL via INTRAVENOUS

## 2016-06-12 MED ORDER — HALOPERIDOL LACTATE 5 MG/ML IJ SOLN: 1.0000 mg | INTRAMUSCULAR | Status: DC | PRN

## 2016-06-12 MED ORDER — DEXMEDETOMIDINE HCL IN NACL 200 MCG/50ML IV SOLN
0.4000 ug/kg/h | INTRAVENOUS | Status: DC
  Administered 2016-06-12: 1.3 ug/kg/h via INTRAVENOUS
  Administered 2016-06-12: 0.8 ug/kg/h via INTRAVENOUS
  Administered 2016-06-12: 1.2 ug/kg/h via INTRAVENOUS
  Administered 2016-06-12: 0.8 ug/kg/h via INTRAVENOUS
  Administered 2016-06-12: 1.499 ug/kg/h via INTRAVENOUS
  Administered 2016-06-12: 0.2 ug/kg/h via INTRAVENOUS
  Administered 2016-06-13 (×2): 1.2 ug/kg/h via INTRAVENOUS
  Administered 2016-06-13: 1.198 ug/kg/h via INTRAVENOUS
  Administered 2016-06-13 (×3): 1.2 ug/kg/h via INTRAVENOUS
  Filled 2016-06-12 (×12): qty 50

## 2016-06-12 MED ORDER — METHADONE HCL 5 MG PO TABS: 10.0000 mg | ORAL_TABLET | ORAL | Status: DC | PRN

## 2016-06-12 MED ORDER — INSULIN ASPART 100 UNIT/ML ~~LOC~~ SOLN
0.0000 [IU] | SUBCUTANEOUS | Status: DC
  Administered 2016-06-12: 2 [IU] via SUBCUTANEOUS
  Administered 2016-06-12 (×2): 3 [IU] via SUBCUTANEOUS
  Administered 2016-06-12: 2 [IU] via SUBCUTANEOUS
  Administered 2016-06-13 (×2): 3 [IU] via SUBCUTANEOUS
  Filled 2016-06-12 (×3): qty 3
  Filled 2016-06-12 (×2): qty 2

## 2016-06-12 MED ORDER — HALOPERIDOL LACTATE 5 MG/ML IJ SOLN
5.0000 mg | Freq: Once | INTRAMUSCULAR | Status: AC
  Administered 2016-06-12: 5 mg via INTRAVENOUS
  Filled 2016-06-12: qty 1

## 2016-06-12 MED ORDER — METHADONE HCL 10 MG PO TABS
10.0000 mg | ORAL_TABLET | Freq: Two times a day (BID) | ORAL | Status: DC
  Administered 2016-06-12 – 2016-06-13 (×2): 10 mg via ORAL
  Filled 2016-06-12 (×2): qty 1

## 2016-06-12 MED ORDER — SALINE SPRAY 0.65 % NA SOLN
1.0000 | NASAL | Status: DC | PRN
  Administered 2016-06-13: 1 via NASAL
  Filled 2016-06-12: qty 44

## 2016-06-12 MED ORDER — ALBUTEROL SULFATE (2.5 MG/3ML) 0.083% IN NEBU: 2.5000 mg | INHALATION_SOLUTION | Freq: Four times a day (QID) | RESPIRATORY_TRACT | Status: DC | PRN

## 2016-06-12 MED ORDER — ZIPRASIDONE MESYLATE 20 MG IM SOLR: 10.0000 mg | INTRAMUSCULAR | Status: DC | PRN

## 2016-06-12 MED ORDER — SODIUM CHLORIDE 0.9 % IV SOLN
30.0000 meq | Freq: Once | INTRAVENOUS | Status: AC
  Administered 2016-06-12: 30 meq via INTRAVENOUS
  Filled 2016-06-12: qty 15

## 2016-06-12 MED ORDER — SODIUM CHLORIDE 0.9 % IV SOLN
INTRAVENOUS | Status: DC
  Administered 2016-06-12: 02:00:00 via INTRAVENOUS

## 2016-06-12 MED ORDER — SODIUM CHLORIDE 0.9 % IV SOLN: 30.0000 meq | Freq: Once | INTRAVENOUS | Status: DC

## 2016-06-12 MED ORDER — POTASSIUM CHLORIDE 2 MEQ/ML IV SOLN
INTRAVENOUS | Status: DC
  Administered 2016-06-12 (×2): via INTRAVENOUS
  Filled 2016-06-12 (×4): qty 1000

## 2016-06-12 MED ORDER — IPRATROPIUM-ALBUTEROL 0.5-2.5 (3) MG/3ML IN SOLN
3.0000 mL | Freq: Four times a day (QID) | RESPIRATORY_TRACT | Status: DC
  Administered 2016-06-12 (×3): 3 mL via RESPIRATORY_TRACT
  Filled 2016-06-12 (×4): qty 3

## 2016-06-12 MED ORDER — ORAL CARE MOUTH RINSE
15.0000 mL | Freq: Two times a day (BID) | OROMUCOSAL | Status: DC
  Administered 2016-06-12 (×2): 15 mL via OROMUCOSAL

## 2016-06-12 MED ORDER — ZIPRASIDONE MESYLATE 20 MG IM SOLR
10.0000 mg | INTRAMUSCULAR | Status: DC | PRN
  Administered 2016-06-12: 10 mg via INTRAMUSCULAR
  Filled 2016-06-12: qty 20

## 2016-06-12 NOTE — Progress Notes (Signed)
After removing condom catheter Patient insisted on trying to void in urinal before he would allow this RN to in and out cath him.  Patient tried for a while to void and then after being unsuccessful, with encouragement from this RN and his wife RN in and out cathed patient and 1150 cc of clear yellow urine was returned.

## 2016-06-12 NOTE — Clinical Social Work Note (Signed)
Admitting physician consulted CSW due to opiod withdrawal. Patient tested positive for opiods upon admission. Nursing is documenting today that patient is disoriented X4. Psych consult is pending. Patient not appropriate for CSW assessment at this time. When appropriate, CSW will complete assessment. Shela Leff MSW,LCSW [phone]

## 2016-06-12 NOTE — Progress Notes (Signed)
RN spoke with Dr. Leslye Peer and made MD aware that patient has yet to void and bladder scan result was almost 900 cc.  Patient responds to voice and talking and states he is trying to pee.  MD stated he is going to order in and out cath.

## 2016-06-12 NOTE — H&P (Signed)
History and Physical   [facility] - [place] @ [facility] Admission History and Physical [employer], D.O.    Patient Name: Tayvion Lauder MR#: [id number] Date of Birth: 05-28-1965 Date of Admission: 06/11/2016  Referring MD/NP/PA: Dr. Quentin Cornwall Primary Care Physician: WHITE, Orlene Och, NP Patient coming from: Home  Chief Complaint: AMS, agitation Please note the entire history is obtained from the patient's emergency department chart, emergency department providers. Patient's personal history is limited by agitation, altered mental status  HPI: Avyukt Cimo is a 51 y.o. male with a known history of OA, BPH, diabetes, hypertension, insomnia, obesity, history of difficult intubation  presents to the emergency department for evaluation of opiate withdrawal. Apparently patient presented to the emergency department yesterday 06/10/16 via EMS after his wife found him on the floor unresponsive, diaphoretic. Narcan was given.   Today he was reportedly using amphetamines and snorting heroin. He took several pills of naltrexone reportedly to get high.  He returns today again via EMS with symptoms of withdrawal, extremely agitated. In our ED, patient received clonidine 0.2 mg twice, Benadryl 25 mg IV once, gabapentin 400 mg by mouth once, Haldol 5 mg IV once, Dilaudid 0.5 mg once, Ativan 5 mg (2, 2, 1) in total, Versed 2 g IV once, Phenergan 12.5 mg IV once, normal saline 1 L bolus and was placed on a Precedex drip which has only helped his symptoms minimally as he is still requiring two aides to help control him.  Review of Systems:  Unable to assess secondary to altered mental status, severe agitation   Past Medical History:  Diagnosis Date  . Arthritis    HANDS BIL  . BPH (benign prostatic hyperplasia)   . Diabetes mellitus ([place])   . Difficult intubation   . Heart murmur   . HTN (hypertension)   . Hypogonadism in male   . Insomnia   . Obesity   . Right-sided chest wall pain  12/25/2015  . Sleep apnea    NO CPAP X 6 MONTHS    Past Surgical History:  Procedure Laterality Date  . APPENDECTOMY  2006   Dr. Tamala Julian  . CHOLECYSTECTOMY  2002   [facility]-Dr. Tamala Julian  . COLONOSCOPY  01/01/2016  . COLONOSCOPY WITH PROPOFOL N/A 01/01/2016   Procedure: COLONOSCOPY WITH PROPOFOL;  Surgeon: [name], MD;  Location: [facility] ENDOSCOPY;  Service: Endoscopy;  Laterality: N/A;  . corrective surgery for sleep apnea  2005  . ESOPHAGOGASTRODUODENOSCOPY (EGD) WITH PROPOFOL N/A 01/01/2016   Procedure: ESOPHAGOGASTRODUODENOSCOPY (EGD) WITH PROPOFOL;  Surgeon: [name], MD;  Location: [facility] ENDOSCOPY;  Service: Endoscopy;  Laterality: N/A;  . LAPAROSCOPIC LYSIS OF ADHESIONS  01/25/2016   Procedure: LAPAROSCOPIC LYSIS OF ADHESIONS;  Surgeon: [name], MD;  Location: [facility] ORS;  Service: General;;  . LAPAROSCOPY N/A 01/25/2016   Procedure: LAPAROSCOPY DIAGNOSTIC;  Surgeon: [name], MD;  Location: [facility] ORS;  Service: General;  Laterality: N/A;  . TONSILLECTOMY  07/2003   palate also removed     reports that he has never smoked. He has never used smokeless tobacco. He reports that he does not drink alcohol or use drugs.  Allergies  Allergen Reactions  . Vicodin [Hydrocodone-Acetaminophen] Nausea And Vomiting  . Atorvastatin     Other reaction(s): Other (See Comments) Leg cramp    Family History  Problem Relation Age of Onset  . Prostate cancer Father   . Leukemia Father   . Diabetes Mellitus II    . Coronary artery disease    .  Hypertension    . Breast cancer Mother     Prior to Admission medications   Medication Sig Start Date End Date Taking? Authorizing Provider  aspirin 81 MG chewable tablet Chew by mouth.    Historical Provider, MD  buPROPion (WELLBUTRIN XL) 150 MG 24 hr tablet take 300 mg in am and take 150 mg in evening 11/18/14   Historical Provider, MD  cloNIDine (CATAPRES) 0.1 MG tablet Take 0.1 mg by mouth 2 (two) times daily.     Historical Provider, MD  fluticasone Asencion Islam) 50 MCG/ACT nasal spray 1 spray by Each Nare route PRN 05/18/14 01/22/16  Historical Provider, MD  gabapentin (NEURONTIN) 600 MG tablet Take 1 tablet by mouth 3 (three) times daily. 05/12/16   Historical Provider, MD  losartan (COZAAR) 50 MG tablet Take 100 mg by mouth every morning.     Historical Provider, MD  losartan-hydrochlorothiazide (HYZAAR) 100-25 MG tablet Take 1 tablet by mouth daily. 04/29/16   Historical Provider, MD  metFORMIN (GLUCOPHAGE) 1000 MG tablet TAKE 1 TABLET (1,000 MG TOTAL) BY MOUTH 2 (TWO) TIMES DAILY WITH MEALS. 11/18/14   Historical Provider, MD  metFORMIN (GLUCOPHAGE-XR) 500 MG 24 hr tablet Take 2 tablets by mouth 2 (two) times daily. 05/12/16   Historical Provider, MD  naltrexone (DEPADE) 50 MG tablet Take 50 mg by mouth at bedtime.  11/21/14   Historical Provider, MD  Probiotic Product (PROBIOTIC DAILY PO) Take by mouth.    Historical Provider, MD  sertraline (ZOLOFT) 50 MG tablet Take 1 tablet by mouth daily. 05/17/16   Historical Provider, MD  testosterone cypionate (DEPOTESTOSTERONE CYPIONATE) 200 MG/ML injection Inject 1 mL (200 mg total) into the muscle every 14 (fourteen) days. 12/26/14   Nori Riis, PA-C  traMADol (ULTRAM) 50 MG tablet Take 1 tablet (50 mg total) by mouth every 4 (four) hours as needed for moderate pain or severe pain. 01/25/16   [name], MD    Physical Exam: Vitals:   06/11/16 2121 06/11/16 2140 06/11/16 2336 06/12/16 0100  BP:  121/77    Pulse:   (!) 104 65  Resp:    (!) 26  Temp:      TempSrc:      SpO2: 100%  98% 97%  Weight: 99.8 kg (220 lb)     Height: 5\' 9"  (1.753 m)       GENERAL: 51 y.o.-year-old white male patient, well-developed, well-nourished severely agitated in bed requiring to nursing assistance. Tremulous. HEENT: Head atraumatic, normocephalic. Pupils equal, round, reactive to light and accommodation. No scleral icterus.  NECK: Supple, full range of motion.  CHEST:  Normal breath sounds bilaterally. No wheezing, rales, rhonchi or crackles. No use of accessory muscles of respiration.  CARDIOVASCULAR: S1, S2 normal. No murmurs, rubs, or gallops. Cap refill <2 seconds. Pulses intact distally.  ABDOMEN: Soft, nondistended, nontender. No rebound, guarding, rigidity. Normoactive bowel sounds present in all four quadrants. No organomegaly or mass. EXTREMITIES: No pedal edema, cyanosis, or clubbing. No calf tenderness or Homan's sign.  NEUROLOGIC: Severely agitated. Unable to comply with exam. Moves all 4 extremities. No noted facial droop    Labs on Admission:  CBC:  Recent Labs Lab 06/11/16 0012 06/11/16 2121  WBC 7.3 13.3*  NEUTROABS 4.8 10.7*  HGB 15.7 16.0  HCT 45.7 47.6  MCV 87.3 85.6  PLT 273 409   Basic Metabolic Panel:  Recent Labs Lab 06/11/16 0012 06/11/16 2121  NA 132* 134*  K 3.5 3.2*  CL 98* 101  CO2 22 19*  GLUCOSE 268* 168*  BUN 13 13  CREATININE 1.51* 1.18  CALCIUM 9.9 9.7   GFR: Estimated Creatinine Clearance: 87.2 mL/min (by C-G formula based on SCr of 1.18 mg/dL). Liver Function Tests:  Recent Labs Lab 06/11/16 0012 06/11/16 2121  AST 39 46*  ALT 34 40  ALKPHOS 63 65  BILITOT 1.0 1.6*  PROT 7.7 7.8  ALBUMIN 4.4 4.7   No results for input(s): LIPASE, AMYLASE in the last 168 hours. No results for input(s): AMMONIA in the last 168 hours. Coagulation Profile: No results for input(s): INR, PROTIME in the last 168 hours. Cardiac Enzymes:  Recent Labs Lab 06/11/16 0012 06/11/16 2121  TROPONINI <0.03 <0.03   BNP (last 3 results) No results for input(s): PROBNP in the last 8760 hours. HbA1C: No results for input(s): HGBA1C in the last 72 hours. CBG: No results for input(s): GLUCAP in the last 168 hours. Lipid Profile: No results for input(s): CHOL, HDL, LDLCALC, TRIG, CHOLHDL, LDLDIRECT in the last 72 hours. Thyroid Function Tests: No results for input(s): TSH, T4TOTAL, FREET4, T3FREE, THYROIDAB in the  last 72 hours. Anemia Panel: No results for input(s): VITAMINB12, FOLATE, FERRITIN, TIBC, IRON, RETICCTPCT in the last 72 hours. Urine analysis:    Component Value Date/Time   COLORURINE YELLOW (A) 06/11/2016 0303   APPEARANCEUR CLEAR (A) 06/11/2016 0303   LABSPEC 1.006 06/11/2016 0303   PHURINE 6.0 06/11/2016 0303   GLUCOSEU >=500 (A) 06/11/2016 0303   HGBUR NEGATIVE 06/11/2016 0303   BILIRUBINUR NEGATIVE 06/11/2016 0303   KETONESUR NEGATIVE 06/11/2016 0303   PROTEINUR NEGATIVE 06/11/2016 0303   NITRITE NEGATIVE 06/11/2016 0303   LEUKOCYTESUR NEGATIVE 06/11/2016 0303   Sepsis Labs: @LABRCNTIP (procalcitonin:4,lacticidven:4) )No results found for this or any previous visit (from the past 240 hour(s)).   Radiological Exams on Admission: Dg Chest 2 View  Result Date: 06/11/2016 CLINICAL DATA:  Dyspnea for a few hours tonight. EXAM: CHEST  2 VIEW COMPARISON:  05/17/2014 FINDINGS: The heart size and mediastinal contours are within normal limits. Both lungs are clear. The visualized skeletal structures are unremarkable. IMPRESSION: No active cardiopulmonary disease. Electronically Signed   By: Andreas Newport M.D.   On: 06/11/2016 00:45   Dg Chest Portable 1 View  Result Date: 06/11/2016 CLINICAL DATA:  Acute short of breath. EXAM: PORTABLE CHEST 1 VIEW COMPARISON:  06/11/2016 FINDINGS: Heart size and vascularity normal. Lungs remain clear. No pneumothorax. No infiltrate or effusion. IMPRESSION: No active disease. Electronically Signed   By: Franchot Gallo M.D.   On: 06/11/2016 21:45    EKG: Normal sinus rhythm at 95 bpm with normal axis, inferolateral T-wave inversions and nonspecific ST-T wave changes. Lateral T-wave inversions are new from previous EKG.  Assessment/Plan  This is a 51 y.o. male with a history of OA, BPH, diabetes, hypertension, insomnia, obesity, history of difficult intubation now being admitted with:  #. Opiate withdrawal syndrome, severe -Admit to  stepdown -Precedex drip -Safety sitter at bedside -Nothing by mouth. Hold all home meds for now secondary to agitation and nothing by mouth status -Crit care consult has been requested. Discussed with Dr. Ashby Dawes  #. Polysubstance use -Education officer, museum  #. Electrolyte abnormalities including hyponatremia and hypokalemia, mild -Replace by vein -Recheck BMP in a.m.  #. EKG abnormalities -Monitor on telemetry -Trend troponins   Admission status: Inpatient, stepdown IV Fluids: Normal saline Diet/Nutrition: Nothing by mouth Consults called: Critical care  DVT Px: Lovenox, SCDs and early ambulation. Code Status: Full Code  Disposition Plan:  To home in 3-4 days  All the records are reviewed and case discussed with ED provider. Management plans discussed with the patient and/or family who express understanding and agree with plan of care.  Caprice Wasko D.O. on 06/12/2016 at 1:28 AM Between 7am to 6pm - Pager - [phone] After 6pm go to [email or web address] - [employer] [facility] Office [phone] CC: Primary care physician; WHITE, Orlene Och, NP   06/12/2016, 1:28 AM

## 2016-06-12 NOTE — Consult Note (Signed)
[facility] Consult   Reason for Consult:  Consult for 51 year old man who came to the hospital last night with once again altered mental status Referring Physician:  Leslye Peer Patient Identification: Micheal Silva MRN:  [id number] Principal Diagnosis: Opiate abuse, episodic Diagnosis:   Patient Active Problem List   Diagnosis Date Noted  . Opiate withdrawal ([place]) [F11.23] 06/12/2016  . Opiate abuse, episodic [F11.10] 06/12/2016  . Abdominal pain, chronic, right upper quadrant [R10.11, G89.29] 12/25/2015  . Right-sided chest wall pain [R07.89] 12/25/2015  . BPH with obstruction/lower urinary tract symptoms [N40.1, N13.8] 12/27/2014  . Hypogonadism in male [E29.1] 12/27/2014  . 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 male patient admitted with patient not able to give any history. History obtained from the wife and the chart.Marland Kitchen  HPI:  Wife interviewed chart reviewed. 27 year old man came to the emergency room last night with altered mental status. He had been in the emergency room just about a day previously also with altered mental status that was from narcotic abuse but was discharged home. Wife reports that then later in the day he seemed to get confused and more agitated. It's unclear to me whether he was sedated when he was brought in but it sounds like he may have had narcotic abuse again or have had some withdrawal symptoms. In any case he was agitated enough in the emergency room that they sedated him and he is currently in the ICU on Precedex. Wife reports that he had evidently been back to using narcotics for at least several weeks without her realizing it. He had stopped taking his prescribed naltrexone. He had not been prescribed complaining of any specific mood symptoms. There was no reason to think that he was having any suicidal thoughts or behaviors.  Social  history: Patient lives at home with his wife.  Medical history: Doesn't appear to have many significant medical problems outside of his opiate use.  Substance abuse history: History of narcotic abuse. Has been in rehabs and in intensive outpatient treatment and apparently had long periods of sobriety. Was being maintained on oral naltrexone but discontinued it. Relapsed recently. Wife says she never knew him to use any drugs except for opiates and cannot explain the amphetamines in his drug screen  Past Psychiatric History: No history of suicide attempts. Previous psychiatric treatment largely revolved around substance abuse although he also was currently seeing a psychiatrist who is prescribing Wellbutrin in treating him for some depression as well.  Risk to Self: Is patient at risk for suicide?: No Risk to Others:   Prior Inpatient Therapy:   Prior Outpatient Therapy:    Past Medical History:  Past Medical History:  Diagnosis Date  . Arthritis    HANDS BIL  . BPH (benign prostatic hyperplasia)   . Diabetes mellitus ([place])   . Difficult intubation   . Heart murmur   . HTN (hypertension)   . Hypogonadism in male   . Insomnia   . Obesity   . Right-sided chest wall pain 12/25/2015  . Sleep apnea    NO CPAP X 6 MONTHS    Past Surgical History:  Procedure Laterality Date  . APPENDECTOMY  2006   Dr. Tamala Julian  . CHOLECYSTECTOMY  2002   [facility]-Dr. Tamala Julian  . COLONOSCOPY  01/01/2016  . COLONOSCOPY WITH PROPOFOL N/A 01/01/2016   Procedure: COLONOSCOPY WITH PROPOFOL;  Surgeon:  [name], MD;  Location: [facility] ENDOSCOPY;  Service: Endoscopy;  Laterality: N/A;  . corrective surgery for sleep apnea  2005  . ESOPHAGOGASTRODUODENOSCOPY (EGD) WITH PROPOFOL N/A 01/01/2016   Procedure: ESOPHAGOGASTRODUODENOSCOPY (EGD) WITH PROPOFOL;  Surgeon: [name], MD;  Location: [facility] ENDOSCOPY;  Service: Endoscopy;  Laterality: N/A;  . LAPAROSCOPIC LYSIS OF ADHESIONS  01/25/2016   Procedure:  LAPAROSCOPIC LYSIS OF ADHESIONS;  Surgeon: [name], MD;  Location: [facility] ORS;  Service: General;;  . LAPAROSCOPY N/A 01/25/2016   Procedure: LAPAROSCOPY DIAGNOSTIC;  Surgeon: [name], MD;  Location: [facility] ORS;  Service: General;  Laterality: N/A;  . TONSILLECTOMY  07/2003   palate also removed   Family History:  Family History  Problem Relation Age of Onset  . Prostate cancer Father   . Leukemia Father   . Diabetes Mellitus II    . Coronary artery disease    . Hypertension    . Breast cancer Mother    Family Psychiatric  History: None known Social History:  History  Alcohol Use No     History  Drug Use No    Social History   Social History  . Marital status: Married    Spouse name: N/A  . Number of children: N/A  . Years of education: N/A   Social History Main Topics  . Smoking status: Never Smoker  . Smokeless tobacco: Never Used  . Alcohol use No  . Drug use: No  . Sexual activity: Not Asked   Other Topics Concern  . None   Social History Narrative  . None   Additional Social History:    Allergies:   Allergies  Allergen Reactions  . Vicodin [Hydrocodone-Acetaminophen] Nausea And Vomiting  . Atorvastatin     Other reaction(s): Other (See Comments) Leg cramp    Labs:  Results for orders placed or performed during the hospital encounter of 06/11/16 (from the past 48 hour(s))  Troponin I     Status: None   Collection Time: 06/11/16  9:21 PM  Result Value Ref Range   Troponin I <0.03 <0.03 ng/mL  CBC with Differential/Platelet     Status: Abnormal   Collection Time: 06/11/16  9:21 PM  Result Value Ref Range   WBC 13.3 (H) 3.8 - 10.6 K/uL   RBC 5.57 4.40 - 5.90 MIL/uL   Hemoglobin 16.0 13.0 - 18.0 g/dL   HCT 47.6 40.0 - 52.0 %   MCV 85.6 80.0 - 100.0 fL   MCH 28.6 26.0 - 34.0 pg   MCHC 33.5 32.0 - 36.0 g/dL   RDW 13.6 11.5 - 14.5 %   Platelets 354 150 - 440 K/uL   Neutrophils Relative % 80 %   Neutro Abs 10.7 (H) 1.4 - 6.5 K/uL    Lymphocytes Relative 10 %   Lymphs Abs 1.3 1.0 - 3.6 K/uL   Monocytes Relative 9 %   Monocytes Absolute 1.2 (H) 0.2 - 1.0 K/uL   Eosinophils Relative 0 %   Eosinophils Absolute 0.1 0 - 0.7 K/uL   Basophils Relative 1 %   Basophils Absolute 0.1 0 - 0.1 K/uL  Comprehensive metabolic panel     Status: Abnormal   Collection Time: 06/11/16  9:21 PM  Result Value Ref Range   Sodium 134 (L) 135 - 145 mmol/L   Potassium 3.2 (L) 3.5 - 5.1 mmol/L   Chloride 101 101 - 111 mmol/L   CO2 19 (L) 22 - 32 mmol/L   Glucose, Bld 168 (  H) 65 - 99 mg/dL   BUN 13 6 - 20 mg/dL   Creatinine, Ser 1.18 0.61 - 1.24 mg/dL   Calcium 9.7 8.9 - 10.3 mg/dL   Total Protein 7.8 6.5 - 8.1 g/dL   Albumin 4.7 3.5 - 5.0 g/dL   AST 46 (H) 15 - 41 U/L   ALT 40 17 - 63 U/L   Alkaline Phosphatase 65 38 - 126 U/L   Total Bilirubin 1.6 (H) 0.3 - 1.2 mg/dL   GFR calc non Af Amer >60 >60 mL/min   GFR calc Af Amer >60 >60 mL/min    Comment: (NOTE) The eGFR has been calculated using the CKD EPI equation. This calculation has not been validated in all clinical situations. eGFR's persistently <60 mL/min signify possible Chronic Kidney Disease.    Anion gap 14 5 - 15  Glucose, capillary     Status: Abnormal   Collection Time: 06/12/16  1:21 AM  Result Value Ref Range   Glucose-Capillary 198 (H) 65 - 99 mg/dL  MRSA PCR Screening     Status: None   Collection Time: 06/12/16  2:38 AM  Result Value Ref Range   MRSA by PCR NEGATIVE NEGATIVE    Comment:        The GeneXpert MRSA Assay (FDA approved for NASAL specimens only), is one component of a comprehensive MRSA colonization surveillance program. It is not intended to diagnose MRSA infection nor to guide or monitor treatment for MRSA infections.   Glucose, capillary     Status: Abnormal   Collection Time: 06/12/16  3:46 AM  Result Value Ref Range   Glucose-Capillary 218 (H) 65 - 99 mg/dL   Comment 1 Notify RN   Glucose, capillary     Status: Abnormal    Collection Time: 06/12/16  4:52 AM  Result Value Ref Range   Glucose-Capillary 203 (H) 65 - 99 mg/dL   Comment 1 Notify RN   CBC     Status: Abnormal   Collection Time: 06/12/16  5:29 AM  Result Value Ref Range   WBC 12.7 (H) 3.8 - 10.6 K/uL   RBC 5.15 4.40 - 5.90 MIL/uL   Hemoglobin 15.3 13.0 - 18.0 g/dL   HCT 44.9 40.0 - 52.0 %   MCV 87.1 80.0 - 100.0 fL   MCH 29.7 26.0 - 34.0 pg   MCHC 34.2 32.0 - 36.0 g/dL   RDW 13.4 11.5 - 14.5 %   Platelets 300 150 - 440 K/uL  Comprehensive metabolic panel     Status: Abnormal   Collection Time: 06/12/16  5:29 AM  Result Value Ref Range   Sodium 135 135 - 145 mmol/L   Potassium 4.1 3.5 - 5.1 mmol/L   Chloride 105 101 - 111 mmol/L   CO2 22 22 - 32 mmol/L   Glucose, Bld 197 (H) 65 - 99 mg/dL   BUN 14 6 - 20 mg/dL   Creatinine, Ser 0.81 0.61 - 1.24 mg/dL   Calcium 8.9 8.9 - 10.3 mg/dL   Total Protein 6.7 6.5 - 8.1 g/dL   Albumin 4.0 3.5 - 5.0 g/dL   AST 38 15 - 41 U/L   ALT 35 17 - 63 U/L   Alkaline Phosphatase 58 38 - 126 U/L   Total Bilirubin 1.3 (H) 0.3 - 1.2 mg/dL   GFR calc non Af Amer >60 >60 mL/min   GFR calc Af Amer >60 >60 mL/min    Comment: (NOTE) The eGFR has been calculated using the CKD  EPI equation. This calculation has not been validated in all clinical situations. eGFR's persistently <60 mL/min signify possible Chronic Kidney Disease.    Anion gap 8 5 - 15  Lipid panel     Status: Abnormal   Collection Time: 06/12/16  5:29 AM  Result Value Ref Range   Cholesterol 152 0 - 200 mg/dL   Triglycerides 64 <150 mg/dL   HDL 37 (L) >40 mg/dL   Total CHOL/HDL Ratio 4.1 RATIO   VLDL 13 0 - 40 mg/dL   LDL Cholesterol 102 (H) 0 - 99 mg/dL    Comment:        Total Cholesterol/HDL:CHD Risk Coronary Heart Disease Risk Table                     Men   Women  1/2 Average Risk   3.4   3.3  Average Risk       5.0   4.4  2 X Average Risk   9.6   7.1  3 X Average Risk  23.4   11.0        Use the calculated Patient Ratio above  and the CHD Risk Table to determine the patient's CHD Risk.        ATP III CLASSIFICATION (LDL):  <100     mg/dL   Optimal  100-129  mg/dL   Near or Above                    Optimal  130-159  mg/dL   Borderline  160-189  mg/dL   High  >190     mg/dL   Very High   Magnesium     Status: None   Collection Time: 06/12/16  5:29 AM  Result Value Ref Range   Magnesium 2.0 1.7 - 2.4 mg/dL  Troponin I     Status: None   Collection Time: 06/12/16  5:29 AM  Result Value Ref Range   Troponin I <0.03 <0.03 ng/mL  Glucose, capillary     Status: Abnormal   Collection Time: 06/12/16  7:27 AM  Result Value Ref Range   Glucose-Capillary 165 (H) 65 - 99 mg/dL  Glucose, capillary     Status: Abnormal   Collection Time: 06/12/16 11:10 AM  Result Value Ref Range   Glucose-Capillary 159 (H) 65 - 99 mg/dL   Comment 1 Notify RN   Glucose, capillary     Status: Abnormal   Collection Time: 06/12/16  4:39 PM  Result Value Ref Range   Glucose-Capillary 150 (H) 65 - 99 mg/dL    Current Facility-Administered Medications  Medication Dose Route Frequency Provider Last Rate Last Dose  . dexmedetomidine (PRECEDEX) 200 MCG/50ML (4 mcg/mL) infusion  0.4-1.5 mcg/kg/hr Intravenous Titrated Bincy S Varughese, NP 5 mL/hr at 06/12/16 1711 0.2 mcg/kg/hr at 06/12/16 1711  . enoxaparin (LOVENOX) injection 40 mg  40 mg Subcutaneous Q24H Alexis Hugelmeyer, DO      . gabapentin (NEURONTIN) capsule 400 mg  400 mg Oral Once Merlyn Lot, MD      . haloperidol lactate (HALDOL) injection 1-4 mg  1-4 mg Intravenous Q3H PRN Wilhelmina Mcardle, MD      . hydrALAZINE (APRESOLINE) injection 10-40 mg  10-40 mg Intravenous Q6H PRN Bincy S Varughese, NP      . insulin aspart (novoLOG) injection 0-15 Units  0-15 Units Subcutaneous Q4H Wilhelmina Mcardle, MD   2 Units at 06/12/16 1719  . ipratropium-albuterol (DUONEB) 0.5-2.5 (3) MG/3ML nebulizer  solution 3 mL  3 mL Nebulization Q6H Wilhelmina Mcardle, MD   3 mL at 06/12/16 1354  .  lactated ringers 1,000 mL with potassium chloride 10 mEq infusion   Intravenous Continuous Wilhelmina Mcardle, MD 75 mL/hr at 06/12/16 1033    . LORazepam (ATIVAN) injection 1 mg  1 mg Intravenous Q4H PRN Wilhelmina Mcardle, MD      . MEDLINE mouth rinse  15 mL Mouth Rinse BID Bincy S Varughese, NP   15 mL at 06/12/16 1034  . methadone (DOLOPHINE) tablet 10 mg  10 mg Oral Q4H PRN Wilhelmina Mcardle, MD      . methadone (DOLOPHINE) tablet 10 mg  10 mg Oral Q12H Wilhelmina Mcardle, MD      . sodium chloride flush (NS) 0.9 % injection 3 mL  3 mL Intravenous Q12H Alexis Hugelmeyer, DO   3 mL at 06/12/16 1034    Musculoskeletal: Strength & Muscle Tone: decreased Gait & Station: unable to stand Patient leans: N/A  Psychiatric Specialty Exam: Physical Exam  Nursing note and vitals reviewed. Constitutional: He appears well-developed and well-nourished.  HENT:  Head: Normocephalic and atraumatic.  Eyes: Conjunctivae are normal. Pupils are equal, round, and reactive to light.  Neck: Normal range of motion.  Cardiovascular: Regular rhythm and normal heart sounds.   Respiratory: Effort normal. No respiratory distress.  GI: Soft.  Musculoskeletal: Normal range of motion.  Neurological: He is alert.  Skin: Skin is warm and dry.  Psychiatric:  Patient was unresponsive and completely sedated.    Review of Systems  Unable to perform ROS: Patient nonverbal    Blood pressure 116/68, pulse 90, temperature 99 F (37.2 C), temperature source Axillary, resp. rate (!) 34, height _0  (1.753 m), weight 95.2 kg (209 lb 14.1 oz), SpO2 99 %.Body mass index is 30.99 kg/m.  General Appearance: Negative  Eye Contact:  None  Speech:  Negative  Volume:  Decreased  Mood:  Negative  Affect:  Negative  Thought Process:  NA  Orientation:  Negative  Thought Content:  Negative  Suicidal Thoughts:  No  Homicidal Thoughts:  No  Memory:  Negative  Judgement:  Negative  Insight:  Negative  Psychomotor Activity:   Negative  Concentration:  Concentration: Negative  Recall:  Negative  Fund of Knowledge:  Negative  Language:  Negative  Akathisia:  Negative  Handed:  Right  AIMS (if indicated):     Assets:  Social Support  ADL's:  Impaired  Cognition:  Impaired,  Severe  Sleep:        Treatment Plan Summary: Plan Patient currently is on a Precedex drip in the intensive care unit as of this morning when I evaluated him. No evidence anyone is presenting at this point of any suicidality. History of opiate abuse appears to have relapsed and may have been using a product that was stronger than what he is used to. Unclear where the amphetamines came from. I will reevaluate tomorrow once he is hopefully woken up and able to communicate better.  Disposition: No evidence of imminent risk to self or others at present.   Patient does not meet criteria for psychiatric inpatient admission.  Alethia Berthold, MD 06/12/2016 6:35 PM

## 2016-06-12 NOTE — Progress Notes (Addendum)
Patient responds to voice.  Alert to self and year.  Restraints readjusted and slack given on all restraints so that patient can move some in bed to move his extremities.  Bladder scan result 860 cc, RN and patient's wife instructed patient to pee and patient kept stating I'm trying to pee.  No void yet.  Will encourage patient to void and if no void will contact MD.  Dr. Leslye Peer aware that patient has not voided yet.  Patient drowsy, precedex titrated down more.  Sitter at bedside.

## 2016-06-12 NOTE — Progress Notes (Signed)
Patient ID: Micheal Silva, male   DOB: 12/14/1965, 51 y.o.   MRN: [id number]  [facility] PROGRESS NOTE  Micheal Silva HFW:[id number] DOB: 1965-09-25 DOA: 06/11/2016 PCP: WHITE, Orlene Och, NP  HPI/Subjective: Patient seen earlier in the day and mumbled when a sternal rub him.  Objective: Vitals:   06/12/16 1500 06/12/16 1509  BP: 108/71   Pulse: 76   Resp: (!) 21   Temp:  99 F (37.2 C)    Filed Weights   06/11/16 2121 06/12/16 0135  Weight: 99.8 kg (220 lb) 95.2 kg (209 lb 14.1 oz)    ROS: Review of Systems  Unable to perform ROS: Acuity of condition   Exam: Physical Exam  Constitutional: He appears lethargic.  HENT:  Nose: No mucosal edema.  Eyes: Conjunctivae and lids are normal. Pupils are equal, round, and reactive to light.  Neck: No JVD present. Carotid bruit is not present. No edema present. No thyroid mass and no thyromegaly present.  Cardiovascular: S1 normal and S2 normal.  Exam reveals no gallop.   No murmur heard. Pulses:      Dorsalis pedis pulses are 2+ on the right side, and 2+ on the left side.  Respiratory: No respiratory distress. He has no wheezes. He has no rhonchi. He has no rales.  GI: Soft. Bowel sounds are normal. There is no tenderness.  Musculoskeletal:       Right ankle: He exhibits no swelling.       Left ankle: He exhibits no swelling.  Lymphadenopathy:    He has no cervical adenopathy.  Neurological: He appears lethargic.  Skin: Skin is warm. No rash noted. Nails show no clubbing.  Psychiatric: He has a normal mood and affect.      Data Reviewed: Basic Metabolic Panel:  Recent Labs Lab 06/11/16 0012 06/11/16 2121 06/12/16 0529  NA 132* 134* 135  K 3.5 3.2* 4.1  CL 98* 101 105  CO2 22 19* 22  GLUCOSE 268* 168* 197*  BUN 13 13 14   CREATININE 1.51* 1.18 0.81  CALCIUM 9.9 9.7 8.9  MG  --   --  2.0   Liver Function Tests:  Recent Labs Lab 06/11/16 0012 06/11/16 2121 06/12/16 0529  AST 39 46* 38  ALT 34 40  35  ALKPHOS 63 65 58  BILITOT 1.0 1.6* 1.3*  PROT 7.7 7.8 6.7  ALBUMIN 4.4 4.7 4.0   CBC:  Recent Labs Lab 06/11/16 0012 06/11/16 2121 06/12/16 0529  WBC 7.3 13.3* 12.7*  NEUTROABS 4.8 10.7*  --   HGB 15.7 16.0 15.3  HCT 45.7 47.6 44.9  MCV 87.3 85.6 87.1  PLT 273 354 300   Cardiac Enzymes:  Recent Labs Lab 06/11/16 0012 06/11/16 2121 06/12/16 0529  TROPONINI <0.03 <0.03 <0.03    CBG:  Recent Labs Lab 06/12/16 0121 06/12/16 0346 06/12/16 0452 06/12/16 0727 06/12/16 1110  GLUCAP 198* 218* 203* 165* 159*    Recent Results (from the past 240 hour(s))  MRSA PCR Screening     Status: None   Collection Time: 06/12/16  2:38 AM  Result Value Ref Range Status   MRSA by PCR NEGATIVE NEGATIVE Final    Comment:        The GeneXpert MRSA Assay (FDA approved for NASAL specimens only), is one component of a comprehensive MRSA colonization surveillance program. It is not intended to diagnose MRSA infection nor to guide or monitor treatment for MRSA infections.      Studies: Dg Chest 2  View  Result Date: 06/11/2016 CLINICAL DATA:  Dyspnea for a few hours tonight. EXAM: CHEST  2 VIEW COMPARISON:  05/17/2014 FINDINGS: The heart size and mediastinal contours are within normal limits. Both lungs are clear. The visualized skeletal structures are unremarkable. IMPRESSION: No active cardiopulmonary disease. Electronically Signed   By: Andreas Newport M.D.   On: 06/11/2016 00:45   Dg Chest Portable 1 View  Result Date: 06/11/2016 CLINICAL DATA:  Acute short of breath. EXAM: PORTABLE CHEST 1 VIEW COMPARISON:  06/11/2016 FINDINGS: Heart size and vascularity normal. Lungs remain clear. No pneumothorax. No infiltrate or effusion. IMPRESSION: No active disease. Electronically Signed   By: Franchot Gallo M.D.   On: 06/11/2016 21:45    Scheduled Meds: . enoxaparin (LOVENOX) injection  40 mg Subcutaneous Q24H  . gabapentin  400 mg Oral Once  . insulin aspart  0-15 Units  Subcutaneous Q4H  . ipratropium-albuterol  3 mL Nebulization Q6H  . mouth rinse  15 mL Mouth Rinse BID  . methadone  10 mg Oral Q12H  . sodium chloride flush  3 mL Intravenous Q12H   Continuous Infusions: . dexmedetomidine 0.4 mcg/kg/hr (06/12/16 1446)  . lactated ringers with kcl 75 mL/hr at 06/12/16 1033    Assessment/Plan:  1. Acute encephalopathy secondary to acute opioid withdrawal secondary to naltrexone. 2. Opioid withdrawal. Try to taper off Precedex. Critical care specialist ordered methadone. Psychiatry consultation. Need to assess patient's mental status when able to talk. 3. Urinary retention. And now catheter every 8 hours as needed. 4. Type 2 diabetes mellitus. Continue to watch on sliding scale at this point. 5. Initial hypokalemia. This has been replaced  Code Status:     Code Status Orders        Start     Ordered   06/12/16 0135  Full code  Continuous     06/12/16 0135    Code Status History    Date Active Date Inactive Code Status Order ID Comments User Context   This patient has a current code status but no historical code status.    Advance Directive Documentation   [place] Most Recent Value  Type of Advance Directive  Living will  Pre-existing out of facility DNR order (yellow form or pink MOST form)  No data  "MOST" Form in Place?  No data     Family Communication: Wife at the bedside Disposition Plan: To be determined  Consultants:  Critical care specialist  Psychiatry  Time spent: 35 minutes  [place], [place]

## 2016-06-12 NOTE — Consult Note (Signed)
PULMONARY / CRITICAL CARE MEDICINE   Name: Micheal Silva MRN: [id number] DOB: 01/19/1966    ADMISSION DATE:  06/11/2016 CONSULTATION DATE: 06/12/16  REFERRING MD:  Dr. Pollyann Samples  CHIEF COMPLAINT:  Severe Agitation  HISTORY OF PRESENT ILLNESS:   Micheal Silva is 51 yo male with PMH significant for DM, HTN, Sleep Apnea and  Heart Murmur. Patient is very agitated and restless therefore history is obtained from ED provider.  Patient was seen on 3/5 for heroin and meth abuse and was found unresponsive by his wife.  He was given Narcan.  On 3/6 patient was reportedly using amphetamines and heroin.  He was also using naltrerexone through out the day to get high. Patients presents to the ED with severe agitation and withdrawal. CCM team consulted for further management.  PAST MEDICAL HISTORY :  He  has a past medical history of Arthritis; BPH (benign prostatic hyperplasia); Diabetes mellitus ([place]); Difficult intubation; Heart murmur; HTN (hypertension); Hypogonadism in male; Insomnia; Obesity; Right-sided chest wall pain (12/25/2015); and Sleep apnea.  PAST SURGICAL HISTORY: He  has a past surgical history that includes corrective surgery for sleep apnea (2005); Tonsillectomy (07/2003); Cholecystectomy (2002); Appendectomy (2006); Colonoscopy (01/01/2016); Esophagogastroduodenoscopy (egd) with propofol (N/A, 01/01/2016); Colonoscopy with propofol (N/A, 01/01/2016); laparoscopy (N/A, 01/25/2016); and Laparoscopic lysis of adhesions (01/25/2016).  Allergies  Allergen Reactions  . Vicodin [Hydrocodone-Acetaminophen] Nausea And Vomiting  . Atorvastatin     Other reaction(s): Other (See Comments) Leg cramp    No current facility-administered medications on file prior to encounter.    Current Outpatient Prescriptions on File Prior to Encounter  Medication Sig  . aspirin 81 MG chewable tablet Chew by mouth.  Marland Kitchen buPROPion (WELLBUTRIN XL) 150 MG 24 hr tablet take 300 mg in am and take 150 mg in evening  .  cloNIDine (CATAPRES) 0.1 MG tablet Take 0.1 mg by mouth 2 (two) times daily.  . fluticasone (FLONASE) 50 MCG/ACT nasal spray 1 spray by Each Nare route PRN  . losartan (COZAAR) 50 MG tablet Take 100 mg by mouth every morning.   . metFORMIN (GLUCOPHAGE) 1000 MG tablet TAKE 1 TABLET (1,000 MG TOTAL) BY MOUTH 2 (TWO) TIMES DAILY WITH MEALS.  . naltrexone (DEPADE) 50 MG tablet Take 50 mg by mouth at bedtime.   . Probiotic Product (PROBIOTIC DAILY PO) Take by mouth.  . testosterone cypionate (DEPOTESTOSTERONE CYPIONATE) 200 MG/ML injection Inject 1 mL (200 mg total) into the muscle every 14 (fourteen) days.  . traMADol (ULTRAM) 50 MG tablet Take 1 tablet (50 mg total) by mouth every 4 (four) hours as needed for moderate pain or severe pain.    FAMILY HISTORY:  His @FAMSTP (<SUBSCRIPT> error)@  SOCIAL HISTORY: He  reports that he has never smoked. He has never used smokeless tobacco. He reports that he does not drink alcohol or use drugs.  REVIEW OF SYSTEMS:   Unable to obtain as the patient is extremely agitated  SUBJECTIVE:  Unable to obtain as the patient is extremely agitated VITAL SIGNS: BP 121/77   Pulse 65   Temp 98.1 F (36.7 C) (Oral)   Resp (!) 26   Ht 5\' 9"  (1.753 m)   Wt 99.8 kg (220 lb)   SpO2 97%   BMI 32.49 kg/m   HEMODYNAMICS:    VENTILATOR SETTINGS:    INTAKE / OUTPUT: No intake/output data recorded.  PHYSICAL EXAMINATION: General: extremely agitated middle aged man on RA Neuro:  Agitated, restless, confused HEENT:  AT,[place], no jvd Cardiovascular:  S1s2,  regular,no m/r/g noted Lungs: clear bilaterally, no wheezes, crackles, rhonchi noted Abdomen:  Soft, non tender,nondistended Musculoskeletal: full ROM, no inflammation/deformity noted Skin:  Grossly intact  LABS:  BMET  Recent Labs Lab 06/11/16 0012 06/11/16 2121  NA 132* 134*  K 3.5 3.2*  CL 98* 101  CO2 22 19*  BUN 13 13  CREATININE 1.51* 1.18  GLUCOSE 268* 168*    Electrolytes  Recent  Labs Lab 06/11/16 0012 06/11/16 2121  CALCIUM 9.9 9.7    CBC  Recent Labs Lab 06/11/16 0012 06/11/16 2121  WBC 7.3 13.3*  HGB 15.7 16.0  HCT 45.7 47.6  PLT 273 354    Coag's No results for input(s): APTT, INR in the last 168 hours.  Sepsis Markers No results for input(s): LATICACIDVEN, PROCALCITON, O2SATVEN in the last 168 hours.  ABG No results for input(s): PHART, PCO2ART, PO2ART in the last 168 hours.  Liver Enzymes  Recent Labs Lab 06/11/16 0012 06/11/16 2121  AST 39 46*  ALT 34 40  ALKPHOS 63 65  BILITOT 1.0 1.6*  ALBUMIN 4.4 4.7    Cardiac Enzymes  Recent Labs Lab 06/11/16 0012 06/11/16 2121  TROPONINI <0.03 <0.03    Glucose No results for input(s): GLUCAP in the last 168 hours.  Imaging Dg Chest Portable 1 View  Result Date: 06/11/2016 CLINICAL DATA:  Acute short of breath. EXAM: PORTABLE CHEST 1 VIEW COMPARISON:  06/11/2016 FINDINGS: Heart size and vascularity normal. Lungs remain clear. No pneumothorax. No infiltrate or effusion. IMPRESSION: No active disease. Electronically Signed   By: Franchot Gallo M.D.   On: 06/11/2016 21:45     STUDIES:  3/6 UDS>> positive for amphetamines and opiate  CULTURES: none  ANTIBIOTICS: none  SIGNIFICANT EVENTS: 3/6 Patient admitted to the ICU with opiate withdrawal syndrome  LINES/TUBES: none   ASSESSMENT / PLAN:  NEUROLOGIC A:   Polysubstance abuse severe Opiate withdrawal syndrome P:   precedex gtt Lorazepam PRN Geodon IM prn Safety sitter at the bedside Restraints Psych consult     PULMONARY A: Hx of Sleep apnea P:   On RA, maintaining O2 sats High risk for intubation, is difficult airway  CARDIOVASCULAR A:  Hx of Hypertension P:  Continuous Telemetry PRN hydralazine   RENAL A:   Hyponatremia Hypokalemia P:   Replace electrolytes per usual guidelines BMP intermittently GASTROINTESTINAL A:   No active issues P:   Npo   HEMATOLOGIC A:   No Active  issues P:  lovenox for DVT prophylaxis  INFECTIOUS A:   No active issues P:   Monitor fever,cbc  ENDOCRINE A:   DM P:   Blood glucose checks with SSI coverage  FAMILY  - Updates: No family present at the bedside     Bincy Varughese,AG-ACNP Pulmonary and [place]   06/12/2016, 1:35 AM  PCCM ATTENDING: The above note has been reviewed in detail. I evaluated the patient on morning rounds. At the time of my evaluation he was severely sedated due to multiple medications that he had received overnight. He is in no distress. Oxygen saturations are adequate on room air. Pupils are pinpoint but reactive light. Chest is clear to auscultation. Cardiac exam is normal. Extremities are warm without edema. There is no abdominal pain.  I think the major problem here was opiate withdrawal due to naltrexone. We will try to wean him off of Precedex today. I have initiated methadone. Ultimately, he probably needs inpatient drug rehabilitation  Merton Border, MD PCCM service Mobile (  [phone] Pager [phone] 06/12/2016

## 2016-06-12 NOTE — Progress Notes (Signed)
Patient resting well.  Arouses to voice and restless at times tossing and turning in bed.  Cooperative.  Alert to self.  Alert to place at times but confused at times to place.  No respiratory distress although tachypnic with RR 25-30.  precedex drip titrated down to 0.2 mcg/kg/H.  Safety sitter at bedside.

## 2016-06-13 LAB — HIV ANTIBODY (ROUTINE TESTING W REFLEX): HIV Screen 4th Generation wRfx: NONREACTIVE

## 2016-06-13 LAB — BASIC METABOLIC PANEL
ANION GAP: 10 (ref 5–15)
BUN: 13 mg/dL (ref 6–20)
CALCIUM: 8.8 mg/dL — AB (ref 8.9–10.3)
CO2: 18 mmol/L — AB (ref 22–32)
CREATININE: 0.95 mg/dL (ref 0.61–1.24)
Chloride: 104 mmol/L (ref 101–111)
GFR calc Af Amer: >60 mL/min (ref >60)
GLUCOSE: 185 mg/dL — AB (ref 65–99)
Potassium: 3.4 mmol/L — ABNORMAL LOW (ref 3.5–5.1)
Sodium: 132 mmol/L — ABNORMAL LOW (ref 135–145)

## 2016-06-13 LAB — GLUCOSE, CAPILLARY
GLUCOSE-CAPILLARY: 78 mg/dL (ref 65–99)
GLUCOSE-CAPILLARY: 144 mg/dL — AB (ref 65–99)
Glucose-Capillary: 161 mg/dL — ABNORMAL HIGH (ref 65–99)
Glucose-Capillary: 156 mg/dL — ABNORMAL HIGH (ref 65–99)
Glucose-Capillary: 153 mg/dL — ABNORMAL HIGH (ref 65–99)

## 2016-06-13 MED ORDER — INSULIN ASPART 100 UNIT/ML ~~LOC~~ SOLN: 0.0000 [IU] | Freq: Every day | SUBCUTANEOUS | Status: DC

## 2016-06-13 MED ORDER — POTASSIUM CHLORIDE CRYS ER 20 MEQ PO TBCR
40.0000 meq | EXTENDED_RELEASE_TABLET | Freq: Once | ORAL | Status: AC
  Administered 2016-06-13: 40 meq via ORAL
  Filled 2016-06-13: qty 2

## 2016-06-13 MED ORDER — GABAPENTIN 600 MG PO TABS
600.0000 mg | ORAL_TABLET | Freq: Three times a day (TID) | ORAL | Status: DC
  Administered 2016-06-13 – 2016-06-14 (×2): 600 mg via ORAL
  Filled 2016-06-13 (×2): qty 1

## 2016-06-13 MED ORDER — METHADONE HCL 10 MG PO TABS
5.0000 mg | ORAL_TABLET | Freq: Every day | ORAL | Status: DC
  Administered 2016-06-14: 5 mg via ORAL
  Filled 2016-06-13: qty 1

## 2016-06-13 MED ORDER — SERTRALINE HCL 50 MG PO TABS
50.0000 mg | ORAL_TABLET | Freq: Every day | ORAL | Status: DC
  Administered 2016-06-13 – 2016-06-14 (×2): 50 mg via ORAL
  Filled 2016-06-13 (×2): qty 1

## 2016-06-13 MED ORDER — DIPHENHYDRAMINE HCL 25 MG PO CAPS
25.0000 mg | ORAL_CAPSULE | Freq: Every evening | ORAL | Status: DC | PRN
  Administered 2016-06-13: 22:00:00 25 mg via ORAL
  Filled 2016-06-13: qty 1

## 2016-06-13 MED ORDER — LORAZEPAM 2 MG/ML IJ SOLN
0.5000 mg | INTRAMUSCULAR | Status: DC | PRN
Start: 2016-06-13 — End: 2016-06-13

## 2016-06-13 MED ORDER — INSULIN ASPART 100 UNIT/ML ~~LOC~~ SOLN
0.0000 [IU] | Freq: Three times a day (TID) | SUBCUTANEOUS | Status: DC
  Administered 2016-06-13: 3 [IU] via SUBCUTANEOUS
  Administered 2016-06-14: 2 [IU] via SUBCUTANEOUS
  Administered 2016-06-14: 08:00:00 3 [IU] via SUBCUTANEOUS
  Filled 2016-06-13: qty 2
  Filled 2016-06-13 (×3): qty 3

## 2016-06-13 NOTE — Progress Notes (Signed)
Patient ID: Micheal Silva, male   DOB: May 01, 1965, 51 y.o.   MRN: [id number]   [facility] PROGRESS NOTE  KIDUS DELMAN JEH:[id number] DOB: [birth year] DOA: 06/11/2016 PCP: WHITE, Orlene Och, NP  HPI/Subjective: Patient awakened this afternoon. Apparently got agitated again overnight and needed to be restarted on Precedex drip. He states that he is not suicidal. He states that he was snorting heroin that came up from a rock. Able to urinate.  Objective: Vitals:   06/13/16 1100 06/13/16 1214  BP: 118/80 122/77  Pulse: 66 76  Resp: (!) 21 14  Temp:  98.5 F (36.9 C)    Filed Weights   06/11/16 2121 06/12/16 0135  Weight: 99.8 kg (220 lb) 95.2 kg (209 lb 14.1 oz)    ROS: Review of Systems  Constitutional: Negative for chills and fever.  Eyes: Negative for blurred vision.  Respiratory: Negative for cough and shortness of breath.   Cardiovascular: Negative for chest pain.  Gastrointestinal: Negative for abdominal pain, constipation, diarrhea, nausea and vomiting.  Genitourinary: Negative for dysuria.  Musculoskeletal: Negative for joint pain.  Neurological: Negative for dizziness and headaches.   Exam: Physical Exam  HENT:  Nose: No mucosal edema.  Eyes: Conjunctivae and lids are normal. Pupils are equal, round, and reactive to light.  Neck: No JVD present. Carotid bruit is not present. No edema present. No thyroid mass and no thyromegaly present.  Cardiovascular: S1 normal and S2 normal.  Exam reveals no gallop.   No murmur heard. Pulses:      Dorsalis pedis pulses are 2+ on the right side, and 2+ on the left side.  Respiratory: No respiratory distress. He has no wheezes. He has no rhonchi. He has no rales.  GI: Soft. Bowel sounds are normal. There is no tenderness.  Musculoskeletal:       Right ankle: He exhibits no swelling.       Left ankle: He exhibits no swelling.  Lymphadenopathy:    He has no cervical adenopathy.  Neurological: He is alert.  Skin: Skin is  warm. No rash noted. Nails show no clubbing.  Psychiatric: He has a normal mood and affect.      Data Reviewed: Basic Metabolic Panel:  Recent Labs Lab 06/11/16 0012 06/11/16 2121 06/12/16 0529 06/13/16 0419  NA 132* 134* 135 132*  K 3.5 3.2* 4.1 3.4*  CL 98* 101 105 104  CO2 22 19* 22 18*  GLUCOSE 268* 168* 197* 185*  BUN 13 13 14 13   CREATININE 1.51* 1.18 0.81 0.95  CALCIUM 9.9 9.7 8.9 8.8*  MG  --   --  2.0  --    Liver Function Tests:  Recent Labs Lab 06/11/16 0012 06/11/16 2121 06/12/16 0529  AST 39 46* 38  ALT 34 40 35  ALKPHOS 63 65 58  BILITOT 1.0 1.6* 1.3*  PROT 7.7 7.8 6.7  ALBUMIN 4.4 4.7 4.0   CBC:  Recent Labs Lab 06/11/16 0012 06/11/16 2121 06/12/16 0529  WBC 7.3 13.3* 12.7*  NEUTROABS 4.8 10.7*  --   HGB 15.7 16.0 15.3  HCT 45.7 47.6 44.9  MCV 87.3 85.6 87.1  PLT 273 354 300   Cardiac Enzymes:  Recent Labs Lab 06/11/16 0012 06/11/16 2121 06/12/16 0529  TROPONINI <0.03 <0.03 <0.03    CBG:  Recent Labs Lab 06/12/16 1956 06/12/16 2349 06/13/16 0416 06/13/16 0730 06/13/16 1153  GLUCAP 149* 169* 161* 156* 153*    Recent Results (from the past 240 hour(s))  MRSA PCR  Screening     Status: None   Collection Time: 06/12/16  2:38 AM  Result Value Ref Range Status   MRSA by PCR NEGATIVE NEGATIVE Final    Comment:        The GeneXpert MRSA Assay (FDA approved for NASAL specimens only), is one component of a comprehensive MRSA colonization surveillance program. It is not intended to diagnose MRSA infection nor to guide or monitor treatment for MRSA infections.      Studies: Dg Chest Portable 1 View  Result Date: 06/11/2016 CLINICAL DATA:  Acute short of breath. EXAM: PORTABLE CHEST 1 VIEW COMPARISON:  06/11/2016 FINDINGS: Heart size and vascularity normal. Lungs remain clear. No pneumothorax. No infiltrate or effusion. IMPRESSION: No active disease. Electronically Signed   By: Franchot Gallo M.D.   On: 06/11/2016 21:45     Scheduled Meds: . enoxaparin (LOVENOX) injection  40 mg Subcutaneous Q24H  . insulin aspart  0-15 Units Subcutaneous TID WC  . insulin aspart  0-5 Units Subcutaneous QHS  . [START ON 06/14/2016] methadone  5 mg Oral Daily  . sodium chloride flush  3 mL Intravenous Q12H    Assessment/Plan:  1. Acute encephalopathy secondary to acute opioid withdrawal secondary to naltrexone. Out of the ICU today. 2. Opioid withdrawal. Patient was snorting heroin. Patient was given methadone today. Get rid of the when necessary methadone and taper this to 5 mg daily. Will need psychiatry clearance prior to disposition 3. Urinary retention. Likely secondary to opioids, patient able to urinate. 4. Type 2 diabetes mellitus. Continue to watch on sliding scale at this point. 5. Hypokalemia. Replace potassium orally  Code Status:     Code Status Orders        Start     Ordered   06/12/16 0135  Full code  Continuous     06/12/16 0135    Code Status History    Date Active Date Inactive Code Status Order ID Comments User Context   This patient has a current code status but no historical code status.    Advance Directive Documentation   [place] Most Recent Value  Type of Advance Directive  Living will  Pre-existing out of facility DNR order (yellow form or pink MOST form)  No data  "MOST" Form in Place?  No data     Family Communication: Wife Yesterday Disposition Plan: To be determined  Consultants:  Critical care specialist  Psychiatry  Time spent: 25 minutes  [place], [place]

## 2016-06-13 NOTE — Evaluation (Signed)
Physical Therapy Evaluation Patient Details Name: Micheal Silva MRN: [id number] DOB: 01-21-1966 Today's Date: 06/13/2016   History of Present Illness  Pt is a 51 yo male admitted [facility] due to drug overdose and abuse with SOB and abdominal pain, discovered to be abusing heroin. Pt was in the ER on 06/10/16 after his wife found him on the floor unresponsive. PMH includes; OA, BPH, DM, HTN, insomnia, history of intubation and drug abuse.    Clinical Impression  Pt awake and oriented and somewhat annoyed, stated he felt "terrible" but willing to participate in PT eval and responded well to commands. Pt lives with his wife and is independent at baseline for all ADLs and IADLs, he works full time in [place] [employer]. Overall strength and ROM were assessed in supine and appear WFLs. Pt was able to move from supine to sitting at EOB independently but complained of dizziness when sitting, BP was assessed (131/80). Attempted to stand w/ min guarding and HHA. Patient became unsteady in stance and began staggering and required mod assist to maintain balance and return to sitting. Pt stated he felt extremely dizzy and light headed in stance, so he was safely returned to supine in bed and symptoms resolved. Nursing staff notified of patient's symptoms. Pt appears to be limited in safe functional mobility due to unsteadiness in standing and decreased activity tolerance secondary to dizziness. Pt will benefit from skilled PT to improve deficits above while in the hospital, will need to further evaluate mobility to determine discharge recommendation.      Follow Up Recommendations Other (comment) (TBD, requires additional mobility assessment)    Equipment Recommendations       Recommendations for Other Services       Precautions / Restrictions Precautions Precautions: Fall Restrictions Weight Bearing Restrictions: No      Mobility  Bed Mobility Overal bed mobility: Modified Independent                 Transfers Overall transfer level: Needs assistance Equipment used: None;1 person hand held assist Transfers: Sit to/from Stand Sit to Stand: Min guard         General transfer comment: slightly unstable during transfer to standing, c/o increased dizziness and light headedness in standing   Ambulation/Gait             General Gait Details: not attempted secondary to increased dizziness in stance   Stairs            Wheelchair Mobility    Modified Rankin (Stroke Patients Only)       Balance Overall balance assessment: Needs assistance Sitting-balance support: Feet supported;No upper extremity supported Sitting balance-Leahy Scale: Good Sitting balance - Comments: complained of some dizziness in sitting      Standing balance-Leahy Scale: Poor Standing balance comment: poor standing posture staggering required mod assist to prevent LOB and safely return to bed, decreased tolerance secondary to dizziness                              Pertinent Vitals/Pain Pain Assessment: No/denies pain    Home Living Family/patient expects to be discharged to:: Private residence Living Arrangements: Spouse/significant other Available Help at Discharge: Family Type of Home: House Home Access: Level entry     Home Layout: One level Home Equipment: None      Prior Function Level of Independence: Independent         Comments: Independent in all ADLs  and IADLs, drives and works full time in [employer]        Extremity/Trunk Assessment   Upper Extremity Assessment Upper Extremity Assessment: Overall WFL for tasks assessed    Lower Extremity Assessment Lower Extremity Assessment: Overall WFL for tasks assessed    Cervical / Trunk Assessment Cervical / Trunk Assessment: Normal  Communication   Communication: No difficulties  Cognition Arousal/Alertness: Awake/alert Behavior During Therapy: Flat affect Overall Cognitive  Status: Within Functional Limits for tasks assessed                 General Comments: able to respond to questions and commands, appropriate, seemed slightly annoyed, good safety awareness     General Comments      Exercises     Assessment/Plan    PT Assessment Patient needs continued PT services  PT Problem List Decreased activity tolerance;Decreased balance;Decreased mobility;Decreased safety awareness;Decreased knowledge of use of DME       PT Treatment Interventions Gait training;Stair training;Functional mobility training;Balance training;Therapeutic exercise;Therapeutic activities;DME instruction;Patient/family education    PT Goals (Current goals can be found in the Care Plan section)  Acute Rehab PT Goals Patient Stated Goal: Return home PT Goal Formulation: With patient Time For Goal Achievement: 06/27/16 Potential to Achieve Goals: Good    Frequency Min 2X/week   Barriers to discharge        Co-evaluation               End of Session Equipment Utilized During Treatment: Gait belt Activity Tolerance: Treatment limited secondary to medical complications (Comment) (dizziness ) Patient left: in bed;with call bell/phone within reach;with nursing/sitter in room;Other (comment) (full time sitter in room w/ pt) Nurse Communication: Mobility status;Other (comment) (pt complaints of dizziness ) PT Visit Diagnosis: Unsteadiness on feet (R26.81);Difficulty in walking, not elsewhere classified (R26.2);Dizziness and giddiness (R42)         Time: 4970-2637 PT Time Calculation (min) (ACUTE ONLY): 9 min   Charges:         PT G Codes:         Costantino Kohlbeck Student PT 06/13/2016, 3:54 PM

## 2016-06-13 NOTE — Progress Notes (Signed)
Patient has been alert to self and place with some confusion.  Cooperative and calm with short periods of being restless.  Vital signs stable.  Tolerating diet.  Report called to Butch Penny, RN on 1C.  Patient moved to room 120 by wheelchair with Doroteo Bradford, [place] who is the Air cabin crew for patient.  This RN called and spoke with patient's wife Lovey Newcomer and made her aware that patient was moved to new room.

## 2016-06-13 NOTE — Progress Notes (Signed)
Pt started to become increasingly agitated this shift and was given ativan as ordered which was not effective pt became more agitated . precedex drip titrated up and is currently at 1.26mcg. Pt pulled out IV mittens are in place. Pt cont, to have urinary retention pt bladder scanned and IN & out cathed as ordered. Pt has sitter at bedside . Will cont to monitor.

## 2016-06-13 NOTE — Consult Note (Signed)
[facility] Consult   Reason for Consult:  Consult follow-up for 51 year old man who was brought into the hospital with altered mental status Referring Physician:  Leslye Peer Patient Identification: Micheal Silva MRN:  [id number] Principal Diagnosis: Opiate abuse, episodic Diagnosis:   Patient Active Problem List   Diagnosis Date Noted  . Opiate withdrawal ([place]) [F11.23] 06/12/2016  . Opiate abuse, episodic [F11.10] 06/12/2016  . Abdominal pain, chronic, right upper quadrant [R10.11, G89.29] 12/25/2015  . Right-sided chest wall pain [R07.89] 12/25/2015  . BPH with obstruction/lower urinary tract symptoms [N40.1, N13.8] 12/27/2014  . Hypogonadism in male [E29.1] 12/27/2014  . 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 male patient admitted with "I just remember coming in here".  HPI:  Patient seen chart reviewed. I saw the patient yesterday in the intensive care unit but he was sedated and couldn't give any history. Today he is awake and communicative. Patient gives essentially the same history that the wife did and that is documented in the chart. He had an initial presentation to the emergency room for having overdosed on heroin and being revived by Narcan. He was allowed to go home and once he was home he on his own volition took a dose of naltrexone which threw him into a state of agitation that brought him back to the hospital. Asian says that he relapsed into using snorted heroin about a month ago. He denied that he had been using any other drugs although when confronted about the positive amphetamine screen he told me he "might have" taken one of his daughter's Adderall. Patient denies any suicidal thought. Denies that there was any suicidality and any of this presentation. Mood is just feeling down and negative about his relapse. Currently not aware of having any  hallucinations not feeling agitated. The sitter in the room tells me that he has still been intermittently a little agitated during the day.  Social history: Patient lives with his wife and daughter. Works. Supportive family situation.  Medical history: Recovering now no significant medical problems ongoing.  Substance abuse history: Long history of opiate abuse which she felt he had gotten under control and had been clean for many months before this recent relapse.  Past Psychiatric History: Past history of outpatient treatment for depression. No history of suicidality.  Risk to Self: Is patient at risk for suicide?: No Risk to Others:   Prior Inpatient Therapy:   Prior Outpatient Therapy:    Past Medical History:  Past Medical History:  Diagnosis Date  . Arthritis    HANDS BIL  . BPH (benign prostatic hyperplasia)   . Diabetes mellitus ([place])   . Difficult intubation   . Heart murmur   . HTN (hypertension)   . Hypogonadism in male   . Insomnia   . Obesity   . Right-sided chest wall pain 12/25/2015  . Sleep apnea    NO CPAP X 6 MONTHS    Past Surgical History:  Procedure Laterality Date  . APPENDECTOMY  2006   Dr. Tamala Julian  . CHOLECYSTECTOMY  2002   [facility]-Dr. Tamala Julian  . COLONOSCOPY  01/01/2016  . COLONOSCOPY WITH PROPOFOL N/A 01/01/2016   Procedure: COLONOSCOPY WITH PROPOFOL;  Surgeon: [name], MD;  Location: [facility] ENDOSCOPY;  Service: Endoscopy;  Laterality: N/A;  . corrective surgery for sleep apnea  2005  . ESOPHAGOGASTRODUODENOSCOPY (EGD) WITH PROPOFOL N/A 01/01/2016  Procedure: ESOPHAGOGASTRODUODENOSCOPY (EGD) WITH PROPOFOL;  Surgeon: [name], MD;  Location: [facility] ENDOSCOPY;  Service: Endoscopy;  Laterality: N/A;  . LAPAROSCOPIC LYSIS OF ADHESIONS  01/25/2016   Procedure: LAPAROSCOPIC LYSIS OF ADHESIONS;  Surgeon: [name], MD;  Location: [facility] ORS;  Service: General;;  . LAPAROSCOPY N/A 01/25/2016   Procedure: LAPAROSCOPY DIAGNOSTIC;  Surgeon:  [name], MD;  Location: [facility] ORS;  Service: General;  Laterality: N/A;  . TONSILLECTOMY  07/2003   palate also removed   Family History:  Family History  Problem Relation Age of Onset  . Prostate cancer Father   . Leukemia Father   . Diabetes Mellitus II    . Coronary artery disease    . Hypertension    . Breast cancer Mother    Family Psychiatric  History: Unknown Social History:  History  Alcohol Use No     History  Drug Use No    Social History   Social History  . Marital status: Married    Spouse name: N/A  . Number of children: N/A  . Years of education: N/A   Social History Main Topics  . Smoking status: Never Smoker  . Smokeless tobacco: Never Used  . Alcohol use No  . Drug use: No  . Sexual activity: Not Asked   Other Topics Concern  . None   Social History Narrative  . None   Additional Social History:    Allergies:   Allergies  Allergen Reactions  . Ativan [Lorazepam] Other (See Comments)    Increased agitation  . Vicodin [Hydrocodone-Acetaminophen] Nausea And Vomiting  . Atorvastatin     Other reaction(s): Other (See Comments) Leg cramp    Labs:  Results for orders placed or performed during the hospital encounter of 06/11/16 (from the past 48 hour(s))  Troponin I     Status: None   Collection Time: 06/11/16  9:21 PM  Result Value Ref Range   Troponin I <0.03 <0.03 ng/mL  CBC with Differential/Platelet     Status: Abnormal   Collection Time: 06/11/16  9:21 PM  Result Value Ref Range   WBC 13.3 (H) 3.8 - 10.6 K/uL   RBC 5.57 4.40 - 5.90 MIL/uL   Hemoglobin 16.0 13.0 - 18.0 g/dL   HCT 47.6 40.0 - 52.0 %   MCV 85.6 80.0 - 100.0 fL   MCH 28.6 26.0 - 34.0 pg   MCHC 33.5 32.0 - 36.0 g/dL   RDW 13.6 11.5 - 14.5 %   Platelets 354 150 - 440 K/uL   Neutrophils Relative % 80 %   Neutro Abs 10.7 (H) 1.4 - 6.5 K/uL   Lymphocytes Relative 10 %   Lymphs Abs 1.3 1.0 - 3.6 K/uL   Monocytes Relative 9 %   Monocytes Absolute 1.2 (H)  0.2 - 1.0 K/uL   Eosinophils Relative 0 %   Eosinophils Absolute 0.1 0 - 0.7 K/uL   Basophils Relative 1 %   Basophils Absolute 0.1 0 - 0.1 K/uL  Comprehensive metabolic panel     Status: Abnormal   Collection Time: 06/11/16  9:21 PM  Result Value Ref Range   Sodium 134 (L) 135 - 145 mmol/L   Potassium 3.2 (L) 3.5 - 5.1 mmol/L   Chloride 101 101 - 111 mmol/L   CO2 19 (L) 22 - 32 mmol/L   Glucose, Bld 168 (H) 65 - 99 mg/dL   BUN 13 6 - 20 mg/dL   Creatinine, Ser 1.18 0.61 - 1.24 mg/dL  Calcium 9.7 8.9 - 10.3 mg/dL   Total Protein 7.8 6.5 - 8.1 g/dL   Albumin 4.7 3.5 - 5.0 g/dL   AST 46 (H) 15 - 41 U/L   ALT 40 17 - 63 U/L   Alkaline Phosphatase 65 38 - 126 U/L   Total Bilirubin 1.6 (H) 0.3 - 1.2 mg/dL   GFR calc non Af Amer >60 >60 mL/min   GFR calc Af Amer >60 >60 mL/min    Comment: (NOTE) The eGFR has been calculated using the CKD EPI equation. This calculation has not been validated in all clinical situations. eGFR's persistently <60 mL/min signify possible Chronic Kidney Disease.    Anion gap 14 5 - 15  Glucose, capillary     Status: Abnormal   Collection Time: 06/12/16  1:21 AM  Result Value Ref Range   Glucose-Capillary 198 (H) 65 - 99 mg/dL  MRSA PCR Screening     Status: None   Collection Time: 06/12/16  2:38 AM  Result Value Ref Range   MRSA by PCR NEGATIVE NEGATIVE    Comment:        The GeneXpert MRSA Assay (FDA approved for NASAL specimens only), is one component of a comprehensive MRSA colonization surveillance program. It is not intended to diagnose MRSA infection nor to guide or monitor treatment for MRSA infections.   Glucose, capillary     Status: Abnormal   Collection Time: 06/12/16  3:46 AM  Result Value Ref Range   Glucose-Capillary 218 (H) 65 - 99 mg/dL   Comment 1 Notify RN   Glucose, capillary     Status: Abnormal   Collection Time: 06/12/16  4:52 AM  Result Value Ref Range   Glucose-Capillary 203 (H) 65 - 99 mg/dL   Comment 1 Notify  RN   CBC     Status: Abnormal   Collection Time: 06/12/16  5:29 AM  Result Value Ref Range   WBC 12.7 (H) 3.8 - 10.6 K/uL   RBC 5.15 4.40 - 5.90 MIL/uL   Hemoglobin 15.3 13.0 - 18.0 g/dL   HCT 44.9 40.0 - 52.0 %   MCV 87.1 80.0 - 100.0 fL   MCH 29.7 26.0 - 34.0 pg   MCHC 34.2 32.0 - 36.0 g/dL   RDW 13.4 11.5 - 14.5 %   Platelets 300 150 - 440 K/uL  Comprehensive metabolic panel     Status: Abnormal   Collection Time: 06/12/16  5:29 AM  Result Value Ref Range   Sodium 135 135 - 145 mmol/L   Potassium 4.1 3.5 - 5.1 mmol/L   Chloride 105 101 - 111 mmol/L   CO2 22 22 - 32 mmol/L   Glucose, Bld 197 (H) 65 - 99 mg/dL   BUN 14 6 - 20 mg/dL   Creatinine, Ser 0.81 0.61 - 1.24 mg/dL   Calcium 8.9 8.9 - 10.3 mg/dL   Total Protein 6.7 6.5 - 8.1 g/dL   Albumin 4.0 3.5 - 5.0 g/dL   AST 38 15 - 41 U/L   ALT 35 17 - 63 U/L   Alkaline Phosphatase 58 38 - 126 U/L   Total Bilirubin 1.3 (H) 0.3 - 1.2 mg/dL   GFR calc non Af Amer >60 >60 mL/min   GFR calc Af Amer >60 >60 mL/min    Comment: (NOTE) The eGFR has been calculated using the CKD EPI equation. This calculation has not been validated in all clinical situations. eGFR's persistently <60 mL/min signify possible Chronic Kidney Disease.  Anion gap 8 5 - 15  Lipid panel     Status: Abnormal   Collection Time: 06/12/16  5:29 AM  Result Value Ref Range   Cholesterol 152 0 - 200 mg/dL   Triglycerides 64 <150 mg/dL   HDL 37 (L) >40 mg/dL   Total CHOL/HDL Ratio 4.1 RATIO   VLDL 13 0 - 40 mg/dL   LDL Cholesterol 102 (H) 0 - 99 mg/dL    Comment:        Total Cholesterol/HDL:CHD Risk Coronary Heart Disease Risk Table                     Men   Women  1/2 Average Risk   3.4   3.3  Average Risk       5.0   4.4  2 X Average Risk   9.6   7.1  3 X Average Risk  23.4   11.0        Use the calculated Patient Ratio above and the CHD Risk Table to determine the patient's CHD Risk.        ATP III CLASSIFICATION (LDL):  <100     mg/dL    Optimal  100-129  mg/dL   Near or Above                    Optimal  130-159  mg/dL   Borderline  160-189  mg/dL   High  >190     mg/dL   Very High   Magnesium     Status: None   Collection Time: 06/12/16  5:29 AM  Result Value Ref Range   Magnesium 2.0 1.7 - 2.4 mg/dL  HIV antibody     Status: None   Collection Time: 06/12/16  5:29 AM  Result Value Ref Range   HIV Screen 4th Generation wRfx Non Reactive Non Reactive    Comment: (NOTE) Performed At: [facility] [place], [place] [id number] Lindon Romp MD QP:[id number]   Troponin I     Status: None   Collection Time: 06/12/16  5:29 AM  Result Value Ref Range   Troponin I <0.03 <0.03 ng/mL  Glucose, capillary     Status: Abnormal   Collection Time: 06/12/16  7:27 AM  Result Value Ref Range   Glucose-Capillary 165 (H) 65 - 99 mg/dL  Glucose, capillary     Status: Abnormal   Collection Time: 06/12/16 11:10 AM  Result Value Ref Range   Glucose-Capillary 159 (H) 65 - 99 mg/dL   Comment 1 Notify RN   Glucose, capillary     Status: Abnormal   Collection Time: 06/12/16  4:39 PM  Result Value Ref Range   Glucose-Capillary 150 (H) 65 - 99 mg/dL  Glucose, capillary     Status: Abnormal   Collection Time: 06/12/16  7:56 PM  Result Value Ref Range   Glucose-Capillary 149 (H) 65 - 99 mg/dL  Glucose, capillary     Status: Abnormal   Collection Time: 06/12/16 11:49 PM  Result Value Ref Range   Glucose-Capillary 169 (H) 65 - 99 mg/dL  Glucose, capillary     Status: Abnormal   Collection Time: 06/13/16  4:16 AM  Result Value Ref Range   Glucose-Capillary 161 (H) 65 - 99 mg/dL  Basic metabolic panel     Status: Abnormal   Collection Time: 06/13/16  4:19 AM  Result Value Ref Range   Sodium 132 (L) 135 - 145 mmol/L  Potassium 3.4 (L) 3.5 - 5.1 mmol/L   Chloride 104 101 - 111 mmol/L   CO2 18 (L) 22 - 32 mmol/L   Glucose, Bld 185 (H) 65 - 99 mg/dL   BUN 13 6 - 20 mg/dL   Creatinine, Ser 0.95 0.61 - 1.24  mg/dL   Calcium 8.8 (L) 8.9 - 10.3 mg/dL   GFR calc non Af Amer >60 >60 mL/min   GFR calc Af Amer >60 >60 mL/min    Comment: (NOTE) The eGFR has been calculated using the CKD EPI equation. This calculation has not been validated in all clinical situations. eGFR's persistently <60 mL/min signify possible Chronic Kidney Disease.    Anion gap 10 5 - 15  Glucose, capillary     Status: Abnormal   Collection Time: 06/13/16  7:30 AM  Result Value Ref Range   Glucose-Capillary 156 (H) 65 - 99 mg/dL  Glucose, capillary     Status: Abnormal   Collection Time: 06/13/16 11:53 AM  Result Value Ref Range   Glucose-Capillary 153 (H) 65 - 99 mg/dL    Current Facility-Administered Medications  Medication Dose Route Frequency Provider Last Rate Last Dose  . enoxaparin (LOVENOX) injection 40 mg  40 mg Subcutaneous Q24H Alexis Hugelmeyer, DO   40 mg at 06/12/16 2004  . insulin aspart (novoLOG) injection 0-15 Units  0-15 Units Subcutaneous TID WC Wilhelmina Mcardle, MD   3 Units at 06/13/16 1157  . insulin aspart (novoLOG) injection 0-5 Units  0-5 Units Subcutaneous QHS Wilhelmina Mcardle, MD      . Derrill Memo ON 06/14/2016] methadone (DOLOPHINE) tablet 5 mg  5 mg Oral Daily Loletha Grayer, MD      . potassium chloride SA (K-DUR,KLOR-CON) CR tablet 40 mEq  40 mEq Oral Once Loletha Grayer, MD      . sodium chloride (OCEAN) 0.65 % nasal spray 1 spray  1 spray Each Nare PRN Alexis Hugelmeyer, DO   1 spray at 06/13/16 0010  . sodium chloride flush (NS) 0.9 % injection 3 mL  3 mL Intravenous Q12H Alexis Hugelmeyer, DO   3 mL at 06/13/16 0962    Musculoskeletal: Strength & Muscle Tone: decreased Gait & Station: unsteady Patient leans: N/A  Psychiatric Specialty Exam: Physical Exam  Nursing note and vitals reviewed. Constitutional: He appears well-developed and well-nourished.  HENT:  Head: Normocephalic and atraumatic.  Eyes: Conjunctivae are normal. Pupils are equal, round, and reactive to light.  Neck:  Normal range of motion.  Cardiovascular: Normal heart sounds.   Respiratory: Effort normal. No respiratory distress.  GI: Soft.  Musculoskeletal: Normal range of motion.  Neurological: He is alert.  Skin: Skin is warm and dry.  Psychiatric: Judgment normal. His affect is blunt. His speech is delayed. He is slowed. Thought content is not paranoid. He expresses no homicidal and no suicidal ideation. He exhibits abnormal recent memory.    Review of Systems  Constitutional: Positive for malaise/fatigue.  HENT: Negative.   Eyes: Negative.   Respiratory: Negative.   Cardiovascular: Negative.   Gastrointestinal: Positive for nausea.  Musculoskeletal: Negative.   Skin: Negative.   Neurological: Negative.   Psychiatric/Behavioral: Positive for memory loss and substance abuse. Negative for depression, hallucinations and suicidal ideas. The patient has insomnia. The patient is not nervous/anxious.     Blood pressure 131/80, pulse 82, temperature 98.2 F (36.8 C), temperature source Oral, resp. rate 20, height 5' 9" (1.753 m), weight 95.2 kg (209 lb 14.1 oz), SpO2 100 %.Body mass index is  30.99 kg/m.  General Appearance: Casual  Eye Contact:  Minimal  Speech:  Slow  Volume:  Decreased  Mood:  Anxious and Dysphoric  Affect:  Congruent  Thought Process:  Goal Directed  Orientation:  Full (Time, Place, and Person)  Thought Content:  Logical  Suicidal Thoughts:  No  Homicidal Thoughts:  No  Memory:  Immediate;   Good Recent;   Fair Remote;   Fair  Judgement:  Fair  Insight:  Fair  Psychomotor Activity:  Decreased  Concentration:  Concentration: Fair  Recall:  [employer] of Knowledge:  Fair  Language:  Fair  Akathisia:  No  Handed:  Right  AIMS (if indicated):     Assets:  Desire for Improvement Financial Resources/Insurance Housing Physical Health Resilience Social Support  ADL's:  Impaired  Cognition:  Impaired,  Mild  Sleep:        Treatment Plan Summary: Daily contact  with patient to assess and evaluate symptoms and progress in treatment, Medication management and Plan Patient now appears to be recovering although it sounds like he has still had some intermittent periods of agitation. I'm a little concerned that he might "sundown" tonight. I suggested that while he does not need to be on commitment he probably still needs the safety sitter at least through today. I see that he was given a very small dose of methadone this morning which is fine as long as it's not used for more than 3 days. I will follow-up with him if he is still in the hospital tomorrow. Meanwhile I advised him to get back into 12 step programs as often as he could, to call his psychiatrist and get back into treatment, and definitely not to take anymore of his oral naltrexone until he has reviewed the whole plan with his outpatient psychiatrist particularly since he now has some methadone in him. Patient agrees to all of this. I will follow up tomorrow if he is still here.  Disposition: Patient does not meet criteria for psychiatric inpatient admission. Supportive therapy provided about ongoing stressors.  Alethia Berthold, MD 06/13/2016 4:59 PM

## 2016-06-13 NOTE — Progress Notes (Signed)
RN made Dr. Alva Garnet aware that patient voided in toilet and it was a good amount but that RN bladder scanned patient post void and residual is 390 cc and patient has a history of BPH.  MD acknowledged and gave no new orders.

## 2016-06-14 LAB — BASIC METABOLIC PANEL
Anion gap: 6 (ref 5–15)
BUN: 10 mg/dL (ref 6–20)
CHLORIDE: 105 mmol/L (ref 101–111)
CO2: 23 mmol/L (ref 22–32)
CREATININE: 1.06 mg/dL (ref 0.61–1.24)
Calcium: 8.8 mg/dL — ABNORMAL LOW (ref 8.9–10.3)
GFR calc Af Amer: >60 mL/min (ref >60)
GFR calc non Af Amer: >60 mL/min (ref >60)
Glucose, Bld: 158 mg/dL — ABNORMAL HIGH (ref 65–99)
Potassium: 3.5 mmol/L (ref 3.5–5.1)
Sodium: 134 mmol/L — ABNORMAL LOW (ref 135–145)

## 2016-06-14 LAB — GLUCOSE, CAPILLARY
Glucose-Capillary: 187 mg/dL — ABNORMAL HIGH (ref 65–99)
Glucose-Capillary: 148 mg/dL — ABNORMAL HIGH (ref 65–99)

## 2016-06-14 MED ORDER — POTASSIUM CHLORIDE CRYS ER 20 MEQ PO TBCR
40.0000 meq | EXTENDED_RELEASE_TABLET | Freq: Once | ORAL | Status: AC
  Administered 2016-06-14: 08:00:00 40 meq via ORAL
  Filled 2016-06-14: qty 2

## 2016-06-14 NOTE — Progress Notes (Signed)
Pt asking about [facility], contacted Education officer, museum and she brought pt a packet about [facility] and the numbers to contact them.  Clarise Cruz, RN

## 2016-06-14 NOTE — Discharge Instructions (Signed)
Opioid Withdrawal  Opioids are powerful substances that relieve pain. Opioids include illegal drugs, such as heroin, as well as prescription pain medicines, such as codeine, morphine, hydrocodone, oxycodone, and fentanyl. Opioid withdrawal is a group of symptoms that can happen if you have been taking opioids for a long time and suddenly stop.  What are the causes?  This condition is caused by taking opioids for weeks and then doing any of the following:  · Stopping use.  · Rapidly reducing use.  · Taking a medicine to block their effect.    What increases the risk?  This condition is more likely to develop in:  · People who take opioids incorrectly.  · People who take opioids for a long period of time.    What are the signs or symptoms?  Symptoms of this condition can be physical or mental. Physical symptoms include:  · Nausea and vomiting.  · Muscle aches or spasms.  · Watery eyes and runny nose.  · Widening of the dark centers of the eyes (dilated pupils).  · Hair standing on end.  · Fever and sweating.  · Intestinal cramping and diarrhea.  · Increased blood pressure and fast pulse.    Mental symptoms include:  · Depression.  · Anxiety.  · Restlessness and irritability.  · Trouble sleeping.    When symptoms start and how long they last depends on if you have been taking an opioid that works fast and then loses its effect quickly (short acting-opioid), an opioid that works for a longer period of time (long-acting opioid), or a drug that blocks the effects of opioids.  · If you have been taking a short-acting opioid, such as heroin and oxycodone, symptoms occur within hours of stopping or reducing the amount you take. The worst symptoms (peak withdrawal) occur in 24-48 hours. Symptoms should subside in 3-5 days.  · If you have been taking a long-acting opioid, such as methadone, symptoms can occur within 30 hours of stopping or reducing the amount you take and can continue for up to 10 days.  · If you are taking a  drug that blocks the effects of opioids, such as naltrexone or naloxone, symptoms begin within minutes.    How is this diagnosed?  This condition is diagnosed based on:  · Your symptoms.  · Your medical history.  · Your history of drug and alcohol use.  · Which medicines you have been taking.    Your health care provider may:  · Perform a physical exam.  · Order tests.  · Ask that you see a mental health professional.    How is this treated?  Treatment for this condition is usually provided by mental health professionals with training in substance use disorders (addiction specialists). Treatment may involve:  · Counseling. This treatment is also called talk therapy. It is provided by substance use treatment counselors.  · Support groups. Support groups are run by people who have quit using opioids. They provide emotional support, advice, and guidance.  · Medicine. Some medicines can help to lessen certain withdrawal symptoms. Sometimes an opioid is prescribed to replace the opioid that you have been taking. You may be asked to take less and less of this opioid over time to lessen or prevent withdrawal symptoms.    Follow these instructions at home:  · Take over-the-counter and prescription medicines only as told by your health care provider.  · Check with your health care provider before starting any new medicines.  ·   Keep all follow-up visits as told by your health care provider. This is important.  Contact a health care provider if:  · You are not able to take your medicines as told.  · Your symptoms get worse.  · You take an opioid after stopping use, or you take more of an opioid than you have been.  Get help right away if:  · You have a seizure.  · You lose consciousness.  · You have serious thoughts about hurting yourself or others.  If you ever feel like you may hurt yourself or others, or have thoughts about taking your own life, get help right away. You can go to your nearest emergency department or  call:  · Your local emergency services (911 in the [place]  · A suicide crisis helpline, such as the National Suicide Prevention Lifeline at 1-800-273-8255. This is open 24 hours a day.    This information is not intended to replace advice given to you by your health care provider. Make sure you discuss any questions you have with your health care provider.  Document Released: 03/28/2003 Document Revised: 01/05/2016 Document Reviewed: 01/05/2016  Elsevier Interactive Patient Education © 2017 [employer]

## 2016-06-14 NOTE — Progress Notes (Signed)
Discussed discharge instructions and medications with pt. IV removed. All questions addressed. Pt transported home via car by his wife.  Clarise Cruz, RN

## 2016-06-14 NOTE — Discharge Summary (Signed)
[facility] NAME: Micheal Silva    MR#:  [id number]  DATE OF BIRTH:  April 04, 1966  DATE OF ADMISSION:  06/11/2016 ADMITTING PHYSICIAN: Harvie Bridge, DO  DATE OF DISCHARGE: 06/14/2016  PRIMARY CARE PHYSICIAN: WHITE, Orlene Och, NP    ADMISSION DIAGNOSIS:  Delirium [R41.0] Withdrawal from opioids ([place]) [F11.23]  DISCHARGE DIAGNOSIS:  Principal Problem:   Opiate abuse, episodic Active Problems:   Opiate withdrawal ([place])   SECONDARY DIAGNOSIS:   Past Medical History:  Diagnosis Date  . Arthritis    HANDS BIL  . BPH (benign prostatic hyperplasia)   . Diabetes mellitus ([place])   . Difficult intubation   . Heart murmur   . HTN (hypertension)   . Hypogonadism in male   . Insomnia   . Obesity   . Right-sided chest wall pain 12/25/2015  . Sleep apnea    NO CPAP X 6 MONTHS    HOSPITAL COURSE:   1.  Acute encephalopathy secondary to acute opioid withdrawal secondary to naltrexone. Patient doing better and feeling well. 2. Opioid withdrawal. The patient was starting her when as outpatient. The patient was given a methadone taper here in the hospital. Patient was cleared by psychiatry to get out of the hospital. Will need outpatient referral to treatment Center. 3. Urinary retention secondary to opioids 4. Type 2 diabetes mellitus. Can go back on Glucophage as outpatient 5. Hypokalemia replaced orally 6. Depression on Zoloft  DISCHARGE CONDITIONS:   Satisfactory  CONSULTS OBTAINED:  Treatment Team:  Gonzella Lex, MD  DRUG ALLERGIES:   Allergies  Allergen Reactions  . Ativan [Lorazepam] Other (See Comments)    Increased agitation  . Vicodin [Hydrocodone-Acetaminophen] Nausea And Vomiting  . Atorvastatin     Other reaction(s): Other (See Comments) Leg cramp    DISCHARGE MEDICATIONS:   Current Discharge Medication List    CONTINUE these medications which have NOT CHANGED   Details  meloxicam (MOBIC) 15  MG tablet Take 15 mg by mouth daily as needed.     aspirin 81 MG chewable tablet Chew by mouth.    gabapentin (NEURONTIN) 600 MG tablet Take 1 tablet by mouth 3 (three) times daily.    metFORMIN (GLUCOPHAGE-XR) 500 MG 24 hr tablet Take 2 tablets by mouth 2 (two) times daily.    sertraline (ZOLOFT) 50 MG tablet Take 1 tablet by mouth daily.      STOP taking these medications     sildenafil (REVATIO) 20 MG tablet      sulfamethoxazole-trimethoprim (BACTRIM DS,SEPTRA DS) 800-160 MG tablet      buPROPion (WELLBUTRIN XL) 150 MG 24 hr tablet      cloNIDine (CATAPRES) 0.1 MG tablet      fluticasone (FLONASE) 50 MCG/ACT nasal spray      losartan-hydrochlorothiazide (HYZAAR) 100-25 MG tablet      naltrexone (DEPADE) 50 MG tablet      Probiotic Product (PROBIOTIC DAILY PO)      testosterone cypionate (DEPOTESTOSTERONE CYPIONATE) 200 MG/ML injection          DISCHARGE INSTRUCTIONS:   Follow-up with your psychiatrist 1 week. Follow-up with your medical doctor 1-2 weeks. Patient interested in RTS, [facility]  If you experience worsening of your admission symptoms, develop shortness of breath, life threatening emergency, suicidal or homicidal thoughts you must seek medical attention immediately by calling 911 or calling your MD immediately  if symptoms less severe.  You Must read complete instructions/literature along with all the possible  adverse reactions/side effects for all the Medicines you take and that have been prescribed to you. Take any new Medicines after you have completely understood and accept all the possible adverse reactions/side effects.   Please note  You were cared for by a hospitalist during your hospital stay. If you have any questions about your discharge medications or the care you received while you were in the hospital after you are discharged, you can call the unit and asked to speak with the hospitalist on call if the hospitalist that took care of you is not  available. Once you are discharged, your primary care physician will handle any further medical issues. Please note that NO REFILLS for any discharge medications will be authorized once you are discharged, as it is imperative that you return to your primary care physician (or establish a relationship with a primary care physician if you do not have one) for your aftercare needs so that they can reassess your need for medications and monitor your lab values.    Today   CHIEF COMPLAINT:  Brought in with altered mental status  HISTORY OF PRESENT ILLNESS:  Micheal Silva  is a 51 y.o. male with a known history of Substance abuse presented after being unresponsive from snorting heroin. Patient was given Narcan and went into withdrawal   VITAL SIGNS:  Blood pressure (!) 150/78, pulse 99, temperature 98.5 F (36.9 C), temperature source Oral, resp. rate 18, height 5\' 9"  (1.753 m), weight 95.2 kg (209 lb 14.1 oz), SpO2 100 %.   PHYSICAL EXAMINATION:  GENERAL:  51 y.o.-year-old patient lying in the bed with no acute distress.  EYES: Pupils equal, round, reactive to light and accommodation. No scleral icterus. Extraocular muscles intact.  HEENT: Head atraumatic, normocephalic. Oropharynx and nasopharynx clear.  NECK:  Supple, no jugular venous distention. No thyroid enlargement, no tenderness.  LUNGS: Normal breath sounds bilaterally, no wheezing, rales,rhonchi or crepitation. No use of accessory muscles of respiration.  CARDIOVASCULAR: S1, S2 normal. No murmurs, rubs, or gallops.  ABDOMEN: Soft, non-tender, non-distended. Bowel sounds present. No organomegaly or mass.  EXTREMITIES: No pedal edema, cyanosis, or clubbing.  NEUROLOGIC: Cranial nerves II through XII are intact. Muscle strength 5/5 in all extremities. Sensation intact. Gait not checked.  PSYCHIATRIC: The patient is alert and oriented x 3.  SKIN: No obvious rash, lesion, or ulcer.   DATA REVIEW:   CBC  Recent Labs Lab  06/12/16 0529  WBC 12.7*  HGB 15.3  HCT 44.9  PLT 300    Chemistries   Recent Labs Lab 06/12/16 0529  06/14/16 0459  NA 135  < > 134*  K 4.1  < > 3.5  CL 105  < > 105  CO2 22  < > 23  GLUCOSE 197*  < > 158*  BUN 14  < > 10  CREATININE 0.81  < > 1.06  CALCIUM 8.9  < > 8.8*  MG 2.0  --   --   AST 38  --   --   ALT 35  --   --   ALKPHOS 58  --   --   BILITOT 1.3*  --   --   < > = values in this interval not displayed.  Cardiac Enzymes  Recent Labs Lab 06/12/16 0529  TROPONINI <0.03    Microbiology Results  Results for orders placed or performed during the hospital encounter of 06/11/16  MRSA PCR Screening     Status: None   Collection Time: 06/12/16  2:38 AM  Result Value Ref Range Status   MRSA by PCR NEGATIVE NEGATIVE Final    Comment:        The GeneXpert MRSA Assay (FDA approved for NASAL specimens only), is one component of a comprehensive MRSA colonization surveillance program. It is not intended to diagnose MRSA infection nor to guide or monitor treatment for MRSA infections.     Management plans discussed with the patient, family and they are in agreement.  CODE STATUS:     Code Status Orders        Start     Ordered   06/12/16 0135  Full code  Continuous     06/12/16 0135    Code Status History    Date Active Date Inactive Code Status Order ID Comments User Context   This patient has a current code status but no historical code status.    Advance Directive Documentation   [facility] Most Recent Value  Type of Advance Directive  Living will  Pre-existing out of facility DNR order (yellow form or pink MOST form)  No data  "MOST" Form in Place?  No data      TOTAL TIME TAKING CARE OF THIS PATIENT: 34 minutes.    Loletha Grayer M.D on 06/14/2016 at 1:45 PM  Between 7am to 6pm - Pager - [phone]  After 6pm go to [email or web address] - Proofreader  [employer] Office  [phone]  CC: Primary care physician;  WHITE, Orlene Och, NP

## 2016-06-14 NOTE — Progress Notes (Signed)
Physical Therapy Treatment Patient Details Name: Micheal Silva MRN: [id number] DOB: August 01, 1965 Today's Date: 06/14/2016    History of Present Illness Pt is a 51 yo male admitted [facility] due to drug overdose and abuse with SOB and abdominal pain, discovered to be abusing heroin. Pt was in the ER on 06/10/16 after his wife found him on the floor unresponsive. PMH includes; OA, BPH, DM, HTN, insomnia, history of intubation and drug abuse.    PT Comments    Pt awake and alert today, stated he was feeling much better and willing to participate in PT treatment. Pt is functioning at baseline, he is independent for all mobility assessed with no signs of dizziness or unsteadiness in standing. He ambulated around nursing station w/o any AD under PT supervision, safely ascended and descended 6 steps w/o use of railing. Pt demonstrated good balance and scored 12/12 on modified DGI- indicative of low fall risk. Pt's unsteadiness and dizziness in stance from previous session were most likely secondary to his medical status, symptoms have resolved this morning and he does not demonstrate any current PT needs; will complete orders at this time, please resubmit orders if patient's status changes.    Follow Up Recommendations  No PT follow up     Equipment Recommendations  None recommended by PT    Recommendations for Other Services       Precautions / Restrictions Precautions Precautions: None Restrictions Weight Bearing Restrictions: No    Mobility  Bed Mobility Overal bed mobility: Independent                Transfers Overall transfer level: Independent Equipment used: None Transfers: Sit to/from [employer] transfer comment: normal transferring w/o any signs of unsteadiness   Ambulation/Gait Ambulation/Gait assistance: Supervision Ambulation Distance (Feet): 250 Feet Assistive device: None Gait Pattern/deviations: WFL(Within Functional Limits)   Gait velocity  interpretation: at or above normal speed for age/gender General Gait Details: pt ambulated w/o any dizziness or LOB, symmetrical gait pattern, able to maneuver obstacles and safely ambulate, stated he feels at his baseline   Stairs Stairs: Yes   Stair Management: No rails Number of Stairs: 6 General stair comments: good safety ascending and descending 6 steps no noticeable impairments or deviations  Wheelchair Mobility    Modified Rankin (Stroke Patients Only)       Balance Overall balance assessment: Independent   Sitting balance-Leahy Scale: Normal       Standing balance-Leahy Scale: Normal                 High Level Balance Comments: Modified DGI = 12/12- low fall risk    Cognition Arousal/Alertness: Awake/alert Behavior During Therapy: WFL for tasks assessed/performed Overall Cognitive Status: Within Functional Limits for tasks assessed                 General Comments: improved cognition today, stated he did not remember PT session yesterday, responds appropriately     Exercises      General Comments        Pertinent Vitals/Pain Pain Assessment: No/denies pain    Home Living                      Prior Function            PT Goals (current goals can now be found in the care plan section) Acute Rehab PT Goals Patient Stated Goal: Return home PT  Goal Formulation: With patient Time For Goal Achievement: 06/27/16 Potential to Achieve Goals: Good Progress towards PT goals: Progressing toward goals    Frequency    Min 2X/week      PT Plan Discharge plan needs to be updated    Co-evaluation             End of Session Equipment Utilized During Treatment: Gait belt Activity Tolerance: Patient tolerated treatment well Patient left: in bed;with call bell/phone within reach Nurse Communication: Mobility status       Time: 9021-1155 PT Time Calculation (min) (ACUTE ONLY): 5 min  Charges:                       G  Codes:       [employer] Student PT 06/14/2016, 9:42 AM

## 2017-01-16 ENCOUNTER — Ambulatory Visit (INDEPENDENT_AMBULATORY_CARE_PROVIDER_SITE_OTHER): Payer: BC Managed Care – PPO | Admitting: General Surgery

## 2017-01-16 ENCOUNTER — Encounter: Payer: Self-pay | Admitting: General Surgery

## 2017-01-16 VITALS — BP 140/78 | HR 82 | Resp 12 | Ht 67.0 in | Wt 213.0 lb

## 2017-01-16 DIAGNOSIS — Z86010 Personal history of colonic polyps: Secondary | ICD-10-CM

## 2017-01-16 MED ORDER — POLYETHYLENE GLYCOL 3350 17 GM/SCOOP PO POWD: ORAL | 0 refills | Status: DC

## 2017-01-16 NOTE — Progress Notes (Signed)
Patient ID: Micheal Silva, male   DOB: 04-26-1965, 51 y.o.   MRN: [id number]  Chief Complaint  Patient presents with  . Follow-up    HPI Micheal Silva is a 50 y.o. male here today for a evaluation of a colonoscopy.  Last colonoscopy done on 01/01/2016. Patient states he moves his bowels every other day. No GI problems at this time.  HPI  Past Medical History:  Diagnosis Date  . Arthritis    HANDS BIL  . BPH (benign prostatic hyperplasia)   . Diabetes mellitus ([place])   . Difficult intubation   . Heart murmur   . HTN (hypertension)   . Hypogonadism in male   . Insomnia   . Obesity   . Right-sided chest wall pain 12/25/2015  . Sleep apnea    NO CPAP X 6 MONTHS    Past Surgical History:  Procedure Laterality Date  . APPENDECTOMY  2006   Dr. Tamala Julian  . CHOLECYSTECTOMY  2002   [facility]-Dr. Tamala Julian  . COLONOSCOPY  01/01/2016  . COLONOSCOPY WITH PROPOFOL N/A 01/01/2016   Procedure: COLONOSCOPY WITH PROPOFOL;  Surgeon: [name], MD;  Location: [facility] ENDOSCOPY;  Service: Endoscopy;  Laterality: N/A;  . corrective surgery for sleep apnea  2005  . ESOPHAGOGASTRODUODENOSCOPY (EGD) WITH PROPOFOL N/A 01/01/2016   Procedure: ESOPHAGOGASTRODUODENOSCOPY (EGD) WITH PROPOFOL;  Surgeon: [name], MD;  Location: [facility] ENDOSCOPY;  Service: Endoscopy;  Laterality: N/A;  . LAPAROSCOPIC LYSIS OF ADHESIONS  01/25/2016   Procedure: LAPAROSCOPIC LYSIS OF ADHESIONS;  Surgeon: [name], MD;  Location: [facility] ORS;  Service: General;;  . LAPAROSCOPY N/A 01/25/2016   Procedure: LAPAROSCOPY DIAGNOSTIC;  Surgeon: [name], MD;  Location: [facility] ORS;  Service: General;  Laterality: N/A;  . TONSILLECTOMY  07/2003   palate also removed    Family History  Problem Relation Age of Onset  . Prostate cancer Father   . Leukemia Father   . Diabetes Mellitus II Unknown   . Coronary artery disease Unknown   . Hypertension Unknown   . Breast cancer Mother     Social History Social History   Substance Use Topics  . Smoking status: Never Smoker  . Smokeless tobacco: Never Used  . Alcohol use No    Allergies  Allergen Reactions  . Ativan [Lorazepam] Other (See Comments)    Increased agitation  . Vicodin [Hydrocodone-Acetaminophen] Nausea And Vomiting  . Atorvastatin     Other reaction(s): Other (See Comments) Leg cramp    Current Outpatient Prescriptions  Medication Sig Dispense Refill  . aspirin 81 MG chewable tablet Chew by mouth.    Marland Kitchen buPROPion (WELLBUTRIN XL) 150 MG 24 hr tablet Take by mouth.    . cloNIDine (CATAPRES) 0.1 MG tablet   1  . losartan-hydrochlorothiazide (HYZAAR) 100-25 MG tablet Take 1 tablet by mouth daily.  0  . metFORMIN (GLUCOPHAGE-XR) 500 MG 24 hr tablet Take 2 tablets by mouth 2 (two) times daily.    . SUBOXONE 8-2 MG FILM     . polyethylene glycol powder (GLYCOLAX/MIRALAX) powder 255 grams one bottle for colonoscopy prep 255 g 0   No current facility-administered medications for this visit.     Review of Systems Review of Systems  Constitutional: Negative.   Respiratory: Negative.   Cardiovascular: Negative.   Gastrointestinal: Negative.     Blood pressure 140/78, pulse 82, resp. rate 12, height 5\' 7"  (1.702 m), weight 213 lb (96.6 kg), SpO2 98 %.  Physical Exam Physical Exam  Constitutional: He is oriented to person, place, and time. He appears well-developed and well-nourished.  Cardiovascular: Normal rate, regular rhythm and normal heart sounds.   Pulmonary/Chest: Effort normal and breath sounds normal.  Neurological: He is alert and oriented to person, place, and time.  Skin: Skin is warm and dry.    Data Reviewed 12 mm polyp in the ascending colon, 25 mm polyp in the sigmoid colon noted on endoscopy completed 01/01/2016. Biopsy results: C. COLON POLYP, ASCENDING; HOT SNARE:  - TUBULAR ADENOMA, MULTIPLE FRAGMENTS.  - NEGATIVE FOR HIGH-GRADE DYSPLASIA AND MALIGNANCY.   D. COLON POLYP, SIGMOID; HOT SNARE:  -  TUBULOVILLOUS ADENOMA WITH FOCAL HIGH-GRADE DYSPLASIA.  - THE INKED BASE APPEARS CLEAR.   E. COLONPOLYP, DISTAL SIGMOID; HOT SNARE:  - FOOD DEBRIS ONLY, NO COLONIC MUCOSA IDENTIFIED.  Marland Kitchen Assessment    Candidate for follow-up colonoscopy based on the large tubulovillous polyp with high-grade dysplasia noted last year.    Plan    The patient denies any recurrent episodes with medication abuse since March 2018 admission.    Colonoscopy with possible biopsy/polypectomy prn: Information regarding the procedure, including its potential risks and complications (including but not limited to perforation of the bowel, which may require emergency surgery to repair, and bleeding) was verbally given to the patient. Educational information regarding lower intestinal endoscopy was given to the patient. Written instructions for how to complete the bowel prep using Miralax were provided. The importance of drinking ample fluids to avoid dehydration as a result of the prep emphasized.   HPI, Physical Exam, Assessment and Plan have been scribed under the direction and in the presence of Hervey Ard, MD.  Gaspar Cola, CMA  I have completed the exam and reviewed the above documentation for accuracy and completeness.  I agree with the above.  Haematologist has been used and any errors in dictation or transcription are unintentional.  Hervey Ard, M.D., F.A.C.S.  [name] 01/17/2017, 7:06 AM  Patient has been scheduled for a colonoscopy on 02-26-17 at [facility]. Miralax prescription has been sent in to the patient's pharmacy today. It is okay for patient to continue an 81 mg aspirin once daily. He will take his blood pressure medication the morning of procedure at 6 am with a sip of water. Colonoscopy instructions have been reviewed with the patient. This patient is aware to call the office if they have further questions.   Dominga Ferry, CMA

## 2017-01-16 NOTE — Patient Instructions (Signed)
Colonoscopy, Adult A colonoscopy is an exam to look at the entire large intestine. During the exam, a lubricated, bendable tube is inserted into the anus and then passed into the rectum, colon, and other parts of the large intestine. A colonoscopy is often done as a part of normal colorectal screening or in response to certain symptoms, such as anemia, persistent diarrhea, abdominal pain, and blood in the stool. The exam can help screen for and diagnose medical problems, including:  Tumors.  Polyps.  Inflammation.  Areas of bleeding.  Tell a health care provider about:  Any allergies you have.  All medicines you are taking, including vitamins, herbs, eye drops, creams, and over-the-counter medicines.  Any problems you or family members have had with anesthetic medicines.  Any blood disorders you have.  Any surgeries you have had.  Any medical conditions you have.  Any problems you have had passing stool. What are the risks? Generally, this is a safe procedure. However, problems may occur, including:  Bleeding.  A tear in the intestine.  A reaction to medicines given during the exam.  Infection (rare).  What happens before the procedure? Eating and drinking restrictions Follow instructions from your health care provider about eating and drinking, which may include:  A few days before the procedure - follow a low-fiber diet. Avoid nuts, seeds, dried fruit, raw fruits, and vegetables.  1-3 days before the procedure - follow a clear liquid diet. Drink only clear liquids, such as clear broth or bouillon, black coffee or tea, clear juice, clear soft drinks or sports drinks, gelatin dessert, and popsicles. Avoid any liquids that contain red or purple dye.  On the day of the procedure - do not eat or drink anything during the 2 hours before the procedure, or within the time period that your health care provider recommends.  Bowel prep If you were prescribed an oral bowel prep  to clean out your colon:  Take it as told by your health care provider. Starting the day before your procedure, you will need to drink a large amount of medicated liquid. The liquid will cause you to have multiple loose stools until your stool is almost clear or light green.  If your skin or anus gets irritated from diarrhea, you may use these to relieve the irritation: ? Medicated wipes, such as adult wet wipes with aloe and vitamin E. ? A skin soothing-product like petroleum jelly.  If you vomit while drinking the bowel prep, take a break for up to 60 minutes and then begin the bowel prep again. If vomiting continues and you cannot take the bowel prep without vomiting, call your health care provider.  General instructions  Ask your health care provider about changing or stopping your regular medicines. This is especially important if you are taking diabetes medicines or blood thinners.  Plan to have someone take you home from the hospital or clinic. What happens during the procedure?  An IV tube may be inserted into one of your veins.  You will be given medicine to help you relax (sedative).  To reduce your risk of infection: ? Your health care team will wash or sanitize their hands. ? Your anal area will be washed with soap.  You will be asked to lie on your side with your knees bent.  Your health care provider will lubricate a long, thin, flexible tube. The tube will have a camera and a light on the end.  The tube will be inserted into your   anus.  The tube will be gently eased through your rectum and colon.  Air will be delivered into your colon to keep it open. You may feel some pressure or cramping.  The camera will be used to take images during the procedure.  A small tissue sample may be removed from your body to be examined under a microscope (biopsy). If any potential problems are found, the tissue will be sent to a lab for testing.  If small polyps are found, your  health care provider may remove them and have them checked for cancer cells.  The tube that was inserted into your anus will be slowly removed. The procedure may vary among health care providers and hospitals. What happens after the procedure?  Your blood pressure, heart rate, breathing rate, and blood oxygen level will be monitored until the medicines you were given have worn off.  Do not drive for 24 hours after the exam.  You may have a small amount of blood in your stool.  You may pass gas and have mild abdominal cramping or bloating due to the air that was used to inflate your colon during the exam.  It is up to you to get the results of your procedure. Ask your health care provider, or the department performing the procedure, when your results will be ready. This information is not intended to replace advice given to you by your health care provider. Make sure you discuss any questions you have with your health care provider. Document Released: 03/22/2000 Document Revised: 01/24/2016 Document Reviewed: 06/06/2015 Elsevier Interactive Patient Education  2018 [employer]  

## 2017-01-17 DIAGNOSIS — Z86010 Personal history of colonic polyps: Secondary | ICD-10-CM | POA: Insufficient documentation

## 2017-02-19 ENCOUNTER — Encounter: Payer: Self-pay | Admitting: *Deleted

## 2017-02-26 ENCOUNTER — Encounter: Payer: Self-pay | Admitting: *Deleted

## 2017-02-26 ENCOUNTER — Ambulatory Visit
Admission: RE | Admit: 2017-02-26 | Discharge: 2017-02-26 | Disposition: A | Discharge status: Home / Self Care | Payer: BC Managed Care – PPO | Source: Ambulatory Visit | Attending: General Surgery | Admitting: General Surgery

## 2017-02-26 ENCOUNTER — Encounter
Admission: RE | Disposition: A | Discharge status: Home / Self Care | Payer: Self-pay | Source: Ambulatory Visit | Attending: General Surgery

## 2017-02-26 ENCOUNTER — Ambulatory Visit: Payer: BC Managed Care – PPO | Admitting: Anesthesiology

## 2017-02-26 DIAGNOSIS — I10 Essential (primary) hypertension: Secondary | ICD-10-CM | POA: Insufficient documentation

## 2017-02-26 DIAGNOSIS — E669 Obesity, unspecified: Secondary | ICD-10-CM | POA: Insufficient documentation

## 2017-02-26 DIAGNOSIS — Z885 Allergy status to narcotic agent status: Secondary | ICD-10-CM | POA: Insufficient documentation

## 2017-02-26 DIAGNOSIS — N400 Enlarged prostate without lower urinary tract symptoms: Secondary | ICD-10-CM | POA: Diagnosis not present

## 2017-02-26 DIAGNOSIS — Z86010 Personal history of colonic polyps: Secondary | ICD-10-CM | POA: Insufficient documentation

## 2017-02-26 DIAGNOSIS — Z1211 Encounter for screening for malignant neoplasm of colon: Secondary | ICD-10-CM | POA: Insufficient documentation

## 2017-02-26 DIAGNOSIS — Z79899 Other long term (current) drug therapy: Secondary | ICD-10-CM | POA: Insufficient documentation

## 2017-02-26 DIAGNOSIS — Z7982 Long term (current) use of aspirin: Secondary | ICD-10-CM | POA: Diagnosis not present

## 2017-02-26 DIAGNOSIS — G4700 Insomnia, unspecified: Secondary | ICD-10-CM | POA: Diagnosis not present

## 2017-02-26 DIAGNOSIS — E119 Type 2 diabetes mellitus without complications: Secondary | ICD-10-CM | POA: Insufficient documentation

## 2017-02-26 DIAGNOSIS — Z7984 Long term (current) use of oral hypoglycemic drugs: Secondary | ICD-10-CM | POA: Diagnosis not present

## 2017-02-26 DIAGNOSIS — Z6833 Body mass index (BMI) 33.0-33.9, adult: Secondary | ICD-10-CM | POA: Diagnosis not present

## 2017-02-26 HISTORY — PX: COLONOSCOPY WITH PROPOFOL: SHX5780

## 2017-02-26 LAB — GLUCOSE, CAPILLARY: Glucose-Capillary: 108 mg/dL — ABNORMAL HIGH (ref 65–99)

## 2017-02-26 SURGERY — COLONOSCOPY WITH PROPOFOL
Anesthesia: General

## 2017-02-26 MED ORDER — FENTANYL CITRATE (PF) 100 MCG/2ML IJ SOLN
INTRAMUSCULAR | Status: AC
  Filled 2017-02-26: qty 2

## 2017-02-26 MED ORDER — LIDOCAINE HCL (CARDIAC) 20 MG/ML IV SOLN
INTRAVENOUS | Status: DC | PRN
  Administered 2017-02-26: 2 mL via INTRAVENOUS

## 2017-02-26 MED ORDER — SODIUM CHLORIDE 0.9 % IV SOLN: INTRAVENOUS | Status: DC

## 2017-02-26 MED ORDER — MIDAZOLAM HCL 2 MG/2ML IJ SOLN
INTRAMUSCULAR | Status: AC
  Filled 2017-02-26: qty 2

## 2017-02-26 MED ORDER — PROPOFOL 10 MG/ML IV BOLUS
INTRAVENOUS | Status: DC | PRN
  Administered 2017-02-26: 40 mg via INTRAVENOUS

## 2017-02-26 MED ORDER — MIDAZOLAM HCL 2 MG/2ML IJ SOLN
INTRAMUSCULAR | Status: DC | PRN
  Administered 2017-02-26 (×2): 1 mg via INTRAVENOUS

## 2017-02-26 MED ORDER — PROPOFOL 500 MG/50ML IV EMUL
INTRAVENOUS | Status: DC | PRN
  Administered 2017-02-26: 120 ug/kg/min via INTRAVENOUS

## 2017-02-26 MED ORDER — PROPOFOL 10 MG/ML IV BOLUS
INTRAVENOUS | Status: AC
  Filled 2017-02-26: qty 20

## 2017-02-26 NOTE — Anesthesia Post-op Follow-up Note (Signed)
Anesthesia QCDR form completed.        

## 2017-02-26 NOTE — Op Note (Signed)
[facility] Patient Name: Micheal Silva Procedure Date: 02/26/2017 9:37 AM MRN: [id number] Account #: [id number] Date of Birth: 08-02-1965 Admit Type: Outpatient Age: 51 Room: [facility] ENDO ROOM 1 Gender: Male Note Status: Finalized Procedure:            Colonoscopy Indications:          High risk colon cancer surveillance: Personal history                        of colonic polyps Providers:            [name], MD Medicines:            Monitored Anesthesia Care Complications:        No immediate complications. Procedure:            Pre-Anesthesia Assessment:                       - Prior to the procedure, a History and Physical was                        performed, and patient medications, allergies and                        sensitivities were reviewed. The patient's tolerance of                        previous anesthesia was reviewed.                       - The risks and benefits of the procedure and the                        sedation options and risks were discussed with the                        patient. All questions were answered and informed                        consent was obtained.                       After obtaining informed consent, the colonoscope was                        passed under direct vision. Throughout the procedure,                        the patient's blood pressure, pulse, and oxygen                        saturations were monitored continuously. The                        Colonoscope was introduced through the anus and                        advanced to the the cecum, identified by the ileocecal                        valve. The colonoscopy was performed without  difficulty. The patient tolerated the procedure well.                        The quality of the bowel preparation was poor. There                        was a large amount of liquid stool with significant   particulate matter (> 8 mm) that severely compromised                        the exam. Careful exam of the sigmoid could be                        completed with extensive washing of the colon wall to                        confirm no recurrence of the resected polyp from last                        year with high grade dysplasia. Findings:      The entire examined colon appeared normal on direct and retroflexion       views. Compromised by poor prep. Impression:           - Preparation of the colon was poor.                       - The entire examined colon is normal on direct and                        retroflexion views.                       - No specimens collected. Recommendation:       - Repeat colonoscopy in 3 years for surveillance. Procedure Code(s):    --- Professional ---                       [phone], Colonoscopy, flexible; diagnostic, including                        collection of specimen(s) by brushing or washing, when                        performed (separate procedure) Diagnosis Code(s):    --- Professional ---                       Z86.010, Personal history of colonic polyps CPT copyright 2016 American [employer] Association. All rights reserved. The codes documented in this report are preliminary and upon coder review may  be revised to meet current compliance requirements. [name], MD 02/26/2017 10:36:21 AM This report has been signed electronically. Number of Addenda: 0 Note Initiated On: 02/26/2017 9:37 AM Scope Withdrawal Time: 0 hours 15 minutes 49 seconds  Total Procedure Duration: 0 hours 40 minutes 38 seconds       [facility]

## 2017-02-26 NOTE — Anesthesia Preprocedure Evaluation (Addendum)
Anesthesia Evaluation  Patient identified by MRN, date of birth, ID band Patient awake    Reviewed: Allergy & Precautions, NPO status , Patient's Chart, lab work & pertinent test results  Airway Mallampati: II       Dental  (+) Teeth Intact   Pulmonary sleep apnea ,    breath sounds clear to auscultation       Cardiovascular Exercise Tolerance: Good hypertension, Pt. on medications  Rhythm:Regular Rate:Normal     Neuro/Psych Anxiety negative neurological ROS     GI/Hepatic negative GI ROS, Neg liver ROS,   Endo/Other  diabetes, Type 2, Oral Hypoglycemic Agents  Renal/GU negative Renal ROS     Musculoskeletal   Abdominal (+) + obese,   Peds negative pediatric ROS (+)  Hematology negative hematology ROS (+)   Anesthesia Other Findings   Reproductive/Obstetrics                            Anesthesia Physical Anesthesia Plan  ASA: III  Anesthesia Plan: General   Post-op Pain Management:    Induction: Intravenous  PONV Risk Score and Plan: 0  Airway Management Planned: Natural Airway and Nasal Cannula  Additional Equipment:   Intra-op Plan:   Post-operative Plan:   Informed Consent: I have reviewed the patients History and Physical, chart, labs and discussed the procedure including the risks, benefits and alternatives for the proposed anesthesia with the patient or authorized representative who has indicated his/her understanding and acceptance.     Plan Discussed with: Surgeon  Anesthesia Plan Comments:         Anesthesia Quick Evaluation

## 2017-02-26 NOTE — H&P (Signed)
Micheal Silva [id number] 02-Jan-1966     HPI: This 51 year old male underwent a colonoscopy in September 2017 for atypical right upper quadrant pain.  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  Repeat exam is planned.  The patient reports tolerating his prep well.  Medications Prior to Admission  Medication Sig Dispense Refill Last Dose  . aspirin 81 MG chewable tablet Chew by mouth.   Past Week at Unknown time  . cloNIDine (CATAPRES) 0.1 MG tablet   1 02/25/2017 at Unknown time  . losartan-hydrochlorothiazide (HYZAAR) 100-25 MG tablet Take 1 tablet by mouth daily.  0 02/25/2017 at Unknown time  . metFORMIN (GLUCOPHAGE-XR) 500 MG 24 hr tablet Take 2 tablets by mouth 2 (two) times daily.   Past Week at Unknown time  . buPROPion (WELLBUTRIN XL) 150 MG 24 hr tablet Take by mouth.     . polyethylene glycol powder (GLYCOLAX/MIRALAX) powder 255 grams one bottle for colonoscopy prep 255 g 0   . SUBOXONE 8-2 MG FILM       Allergies  Allergen Reactions  . Ativan [Lorazepam] Other (See Comments)    Increased agitation  . Vicodin [Hydrocodone-Acetaminophen] Nausea And Vomiting  . Atorvastatin     Other reaction(s): Other (See Comments) Leg cramp   Past Medical History:  Diagnosis Date  . Arthritis    HANDS BIL  . BPH (benign prostatic hyperplasia)   . Diabetes mellitus ([place])   . Difficult intubation   . Heart murmur   . HTN (hypertension)   . Hypogonadism in male   . Insomnia   . Obesity   . Right-sided chest wall pain 12/25/2015  . Sleep apnea    NO CPAP X 6 MONTHS   Past Surgical History:  Procedure Laterality Date  . APPENDECTOMY  2006   Dr. Tamala Julian  . CHOLECYSTECTOMY  2002   [facility]-Dr. Tamala Julian  . COLONOSCOPY  01/01/2016  . COLONOSCOPY WITH PROPOFOL N/A 01/01/2016   Procedure: COLONOSCOPY WITH PROPOFOL;  Surgeon: [name], MD;  Location: [facility] ENDOSCOPY;  Service: Endoscopy;  Laterality: N/A;  .  corrective surgery for sleep apnea  2005  . ESOPHAGOGASTRODUODENOSCOPY (EGD) WITH PROPOFOL N/A 01/01/2016   Procedure: ESOPHAGOGASTRODUODENOSCOPY (EGD) WITH PROPOFOL;  Surgeon: [name], MD;  Location: [facility] ENDOSCOPY;  Service: Endoscopy;  Laterality: N/A;  . LAPAROSCOPIC LYSIS OF ADHESIONS  01/25/2016   Procedure: LAPAROSCOPIC LYSIS OF ADHESIONS;  Surgeon: [name], MD;  Location: [facility] ORS;  Service: General;;  . LAPAROSCOPY N/A 01/25/2016   Procedure: LAPAROSCOPY DIAGNOSTIC;  Surgeon: [name], MD;  Location: [facility] ORS;  Service: General;  Laterality: N/A;  . TONSILLECTOMY  07/2003   palate also removed   Social History   Socioeconomic History  . Marital status: Married    Spouse name: Not on file  . Number of children: Not on file  . Years of education: Not on file  . Highest education level: Not on file  Social Needs  . Financial resource strain: Not on file  . Food insecurity - worry: Not on file  . Food insecurity - inability: Not on file  . Transportation needs - medical: Not on file  . Transportation needs - non-medical: Not on file  Occupational History  . Not on file  Tobacco Use  . Smoking status: Never Smoker  . Smokeless tobacco: Never Used  Substance and Sexual Activity  . Alcohol use: No    Alcohol/week:  0.0 oz  . Drug use: No  . Sexual activity: Not on file  Other Topics Concern  . Not on file  Social History Narrative  . Not on file   Social History   Social History Narrative  . Not on file     ROS: Negative.     PE: HEENT: Negative. Lungs: Clear. Cardio: RR.  Assessment/Plan:  Proceed with planned endoscopy.  [name] 02/26/2017

## 2017-02-26 NOTE — Anesthesia Postprocedure Evaluation (Signed)
Anesthesia Post Note  Patient: Micheal Silva  Procedure(s) Performed: COLONOSCOPY WITH PROPOFOL (N/A )  Patient location during evaluation: PACU Anesthesia Type: General Level of consciousness: awake Pain management: pain level controlled Vital Signs Assessment: post-procedure vital signs reviewed and stable Respiratory status: nonlabored ventilation Cardiovascular status: stable Anesthetic complications: no     Last Vitals:  Vitals:   02/26/17 1057 02/26/17 1107  BP: 115/68 128/84  Pulse: 80 63  Resp: 17 13  Temp:    SpO2: 100% 100%    Last Pain:  Vitals:   02/26/17 1047  TempSrc:   PainSc: Asleep                 [name],Reyes Fifield

## 2017-02-26 NOTE — Transfer of Care (Signed)
Immediate Anesthesia Transfer of Care Note  Patient: Micheal Silva  Procedure(s) Performed: COLONOSCOPY WITH PROPOFOL (N/A )  Patient Location: PACU  Anesthesia Type:General  Level of Consciousness: awake  Airway & Oxygen Therapy: Patient Spontanous Breathing and Patient connected to nasal cannula oxygen  Post-op Assessment: Report given to RN and Post -op Vital signs reviewed and stable  Post vital signs: Reviewed  Last Vitals:  Vitals:   02/26/17 0919  BP: 124/83  Pulse: (!) 56  Resp: 16  Temp: 36.7 C  SpO2: 100%    Last Pain:  Vitals:   02/26/17 0919  TempSrc: Tympanic         Complications: No apparent anesthesia complications

## 2017-03-03 ENCOUNTER — Encounter: Payer: Self-pay | Admitting: General Surgery

## 2017-10-14 ENCOUNTER — Other Ambulatory Visit: Payer: Self-pay | Admitting: Orthopedic Surgery

## 2017-10-14 ENCOUNTER — Ambulatory Visit
Admission: RE | Admit: 2017-10-14 | Discharge: 2017-10-14 | Disposition: A | Discharge status: Home / Self Care | Payer: BC Managed Care – PPO | Source: Ambulatory Visit | Attending: Orthopedic Surgery | Admitting: Orthopedic Surgery

## 2017-10-14 DIAGNOSIS — M7041 Prepatellar bursitis, right knee: Secondary | ICD-10-CM | POA: Insufficient documentation

## 2017-10-14 DIAGNOSIS — M1711 Unilateral primary osteoarthritis, right knee: Secondary | ICD-10-CM | POA: Insufficient documentation

## 2017-10-14 DIAGNOSIS — M25561 Pain in right knee: Secondary | ICD-10-CM | POA: Insufficient documentation

## 2017-10-14 DIAGNOSIS — M7121 Synovial cyst of popliteal space [Baker], right knee: Secondary | ICD-10-CM | POA: Diagnosis not present

## 2017-10-16 ENCOUNTER — Encounter: Payer: Self-pay | Admitting: *Deleted

## 2017-10-16 ENCOUNTER — Other Ambulatory Visit: Payer: Self-pay

## 2017-10-23 ENCOUNTER — Ambulatory Visit
Admission: RE | Admit: 2017-10-23 | Discharge: 2017-10-23 | Disposition: A | Discharge status: Home / Self Care | Payer: BC Managed Care – PPO | Source: Ambulatory Visit | Attending: Orthopedic Surgery | Admitting: Orthopedic Surgery

## 2017-10-23 ENCOUNTER — Ambulatory Visit: Payer: BC Managed Care – PPO | Admitting: Anesthesiology

## 2017-10-23 ENCOUNTER — Encounter
Admission: RE | Disposition: A | Discharge status: Home / Self Care | Payer: Self-pay | Source: Ambulatory Visit | Attending: Orthopedic Surgery

## 2017-10-23 DIAGNOSIS — Z7984 Long term (current) use of oral hypoglycemic drugs: Secondary | ICD-10-CM | POA: Diagnosis not present

## 2017-10-23 DIAGNOSIS — G4730 Sleep apnea, unspecified: Secondary | ICD-10-CM | POA: Insufficient documentation

## 2017-10-23 DIAGNOSIS — F419 Anxiety disorder, unspecified: Secondary | ICD-10-CM | POA: Diagnosis not present

## 2017-10-23 DIAGNOSIS — E785 Hyperlipidemia, unspecified: Secondary | ICD-10-CM | POA: Insufficient documentation

## 2017-10-23 DIAGNOSIS — E119 Type 2 diabetes mellitus without complications: Secondary | ICD-10-CM | POA: Insufficient documentation

## 2017-10-23 DIAGNOSIS — F329 Major depressive disorder, single episode, unspecified: Secondary | ICD-10-CM | POA: Insufficient documentation

## 2017-10-23 DIAGNOSIS — Z7982 Long term (current) use of aspirin: Secondary | ICD-10-CM | POA: Insufficient documentation

## 2017-10-23 DIAGNOSIS — S83241A Other tear of medial meniscus, current injury, right knee, initial encounter: Secondary | ICD-10-CM | POA: Diagnosis not present

## 2017-10-23 DIAGNOSIS — I10 Essential (primary) hypertension: Secondary | ICD-10-CM | POA: Diagnosis not present

## 2017-10-23 DIAGNOSIS — Y929 Unspecified place or not applicable: Secondary | ICD-10-CM | POA: Diagnosis not present

## 2017-10-23 DIAGNOSIS — Z79899 Other long term (current) drug therapy: Secondary | ICD-10-CM | POA: Insufficient documentation

## 2017-10-23 DIAGNOSIS — X58XXXA Exposure to other specified factors, initial encounter: Secondary | ICD-10-CM | POA: Diagnosis not present

## 2017-10-23 HISTORY — PX: KNEE ARTHROTOMY: SHX5881

## 2017-10-23 HISTORY — DX: Hyperlipidemia, unspecified: E78.5

## 2017-10-23 HISTORY — DX: Anxiety disorder, unspecified: F41.9

## 2017-10-23 HISTORY — DX: Depression, unspecified: F32.A

## 2017-10-23 HISTORY — DX: Major depressive disorder, single episode, unspecified: F32.9

## 2017-10-23 LAB — GLUCOSE, CAPILLARY
GLUCOSE-CAPILLARY: 124 mg/dL — AB (ref 70–99)
GLUCOSE-CAPILLARY: 155 mg/dL — AB (ref 70–99)

## 2017-10-23 SURGERY — ARTHROTOMY, KNEE
Anesthesia: General | Laterality: Right | Wound class: Clean

## 2017-10-23 MED ORDER — FENTANYL CITRATE (PF) 100 MCG/2ML IJ SOLN: 25.0000 ug | INTRAMUSCULAR | Status: DC | PRN

## 2017-10-23 MED ORDER — ASPIRIN EC 325 MG PO TBEC: 325.0000 mg | DELAYED_RELEASE_TABLET | Freq: Every day | ORAL | 0 refills | Status: AC

## 2017-10-23 MED ORDER — MIDAZOLAM HCL 5 MG/5ML IJ SOLN
INTRAMUSCULAR | Status: DC | PRN
  Administered 2017-10-23: 2 mg via INTRAVENOUS

## 2017-10-23 MED ORDER — ONDANSETRON 4 MG PO TBDP: 4.0000 mg | ORAL_TABLET | Freq: Three times a day (TID) | ORAL | 0 refills | Status: DC | PRN

## 2017-10-23 MED ORDER — FENTANYL CITRATE (PF) 100 MCG/2ML IJ SOLN
INTRAMUSCULAR | Status: DC | PRN
  Administered 2017-10-23 (×3): 25 ug via INTRAVENOUS
  Administered 2017-10-23: 50 ug via INTRAVENOUS
  Administered 2017-10-23 (×3): 25 ug via INTRAVENOUS

## 2017-10-23 MED ORDER — LIDOCAINE-EPINEPHRINE 1 %-1:100000 IJ SOLN
INTRAMUSCULAR | Status: DC | PRN
  Administered 2017-10-23: 2.5 mL via INTRAMUSCULAR

## 2017-10-23 MED ORDER — LIDOCAINE HCL (CARDIAC) PF 100 MG/5ML IV SOSY
PREFILLED_SYRINGE | INTRAVENOUS | Status: DC | PRN
  Administered 2017-10-23: 40 mg via INTRATRACHEAL

## 2017-10-23 MED ORDER — CEFAZOLIN SODIUM-DEXTROSE 2-4 GM/100ML-% IV SOLN
2.0000 g | Freq: Once | INTRAVENOUS | Status: AC
  Administered 2017-10-23: 2 g via INTRAVENOUS

## 2017-10-23 MED ORDER — PROPOFOL 10 MG/ML IV BOLUS
INTRAVENOUS | Status: DC | PRN
  Administered 2017-10-23: 160 mg via INTRAVENOUS

## 2017-10-23 MED ORDER — GLYCOPYRROLATE 0.2 MG/ML IJ SOLN
INTRAMUSCULAR | Status: DC | PRN
  Administered 2017-10-23: 0.1 mg via INTRAVENOUS

## 2017-10-23 MED ORDER — LACTATED RINGERS IV SOLN
INTRAVENOUS | Status: DC
  Administered 2017-10-23: 12:00:00 via INTRAVENOUS

## 2017-10-23 MED ORDER — ONDANSETRON HCL 4 MG/2ML IJ SOLN
INTRAMUSCULAR | Status: DC | PRN
  Administered 2017-10-23: 4 mg via INTRAVENOUS

## 2017-10-23 MED ORDER — DEXAMETHASONE SODIUM PHOSPHATE 4 MG/ML IJ SOLN
INTRAMUSCULAR | Status: DC | PRN
  Administered 2017-10-23: 4 mg via INTRAVENOUS

## 2017-10-23 MED ORDER — OXYCODONE HCL 5 MG PO TABS: 5.0000 mg | ORAL_TABLET | Freq: Once | ORAL | Status: DC | PRN

## 2017-10-23 MED ORDER — IBUPROFEN 800 MG PO TABS: 800.0000 mg | ORAL_TABLET | Freq: Three times a day (TID) | ORAL | 0 refills | Status: AC

## 2017-10-23 MED ORDER — OXYCODONE HCL 5 MG PO TABS: 5.0000 mg | ORAL_TABLET | ORAL | 0 refills | Status: AC | PRN

## 2017-10-23 MED ORDER — ACETAMINOPHEN 500 MG PO TABS: 1000.0000 mg | ORAL_TABLET | Freq: Three times a day (TID) | ORAL | 2 refills | Status: AC

## 2017-10-23 MED ORDER — OXYCODONE HCL 5 MG/5ML PO SOLN: 5.0000 mg | Freq: Once | ORAL | Status: DC | PRN

## 2017-10-23 MED ORDER — LIDOCAINE-EPINEPHRINE 1 %-1:100000 IJ SOLN
INTRAMUSCULAR | Status: DC | PRN
  Administered 2017-10-23: 2.5 mL

## 2017-10-23 MED ORDER — IBUPROFEN 800 MG PO TABS
800.0000 mg | ORAL_TABLET | Freq: Once | ORAL | Status: AC
Start: 2017-10-23 — End: 2017-10-23
  Administered 2017-10-23: 800 mg via ORAL

## 2017-10-23 MED ORDER — ACETAMINOPHEN 10 MG/ML IV SOLN
1000.0000 mg | Freq: Once | INTRAVENOUS | Status: AC
  Administered 2017-10-23: 1000 mg via INTRAVENOUS

## 2017-10-23 SURGICAL SUPPLY — 39 items
ADAPTER IRRIG TUBE 2 SPIKE SOL (ADAPTER) ×6 IMPLANT
BNDG COHESIVE 4X5 TAN STRL (GAUZE/BANDAGES/DRESSINGS) ×3 IMPLANT
BNDG ESMARK 6X12 TAN STRL LF (GAUZE/BANDAGES/DRESSINGS) ×3 IMPLANT
BUR RADIUS 3.5 (BURR) ×3 IMPLANT
CHLORAPREP W/TINT 26ML (MISCELLANEOUS) ×3 IMPLANT
COOLER POLAR GLACIER W/PUMP (MISCELLANEOUS) ×3 IMPLANT
COVER LIGHT HANDLE UNIVERSAL (MISCELLANEOUS) ×6 IMPLANT
CUFF TOURN SGL QUICK 30 (MISCELLANEOUS) ×2
CUFF TOURN SGL QUICK 34 (TOURNIQUET CUFF) ×2
CUFF TRNQT CYL 34X4X40X1 (TOURNIQUET CUFF) ×1 IMPLANT
CUFF TRNQT CYL LO 30X4X (MISCELLANEOUS) ×1 IMPLANT
DECANTER SPIKE VIAL GLASS SM (MISCELLANEOUS) ×6 IMPLANT
DRAPE IMP U-DRAPE 54X76 (DRAPES) ×3 IMPLANT
GAUZE SPONGE 4X4 12PLY STRL (GAUZE/BANDAGES/DRESSINGS) ×3 IMPLANT
GLOVE BIO SURGEON STRL SZ7.5 (GLOVE) ×3 IMPLANT
GLOVE BIOGEL PI IND STRL 8 (GLOVE) ×1 IMPLANT
GLOVE BIOGEL PI INDICATOR 8 (GLOVE) ×2
GOWN STRL REUS W/ TWL LRG LVL3 (GOWN DISPOSABLE) IMPLANT
GOWN STRL REUS W/TWL LRG LVL3 (GOWN DISPOSABLE) IMPLANT
IV LACTATED RINGER IRRG 3000ML (IV SOLUTION) ×16
IV LR IRRIG 3000ML ARTHROMATIC (IV SOLUTION) ×8 IMPLANT
KIT TURNOVER KIT A (KITS) ×3 IMPLANT
MAT ABSORB  FLUID 56X50 GRAY (MISCELLANEOUS) ×2
MAT ABSORB FLUID 56X50 GRAY (MISCELLANEOUS) ×1 IMPLANT
NEEDLE HYPO 21X1.5 SAFETY (NEEDLE) ×3 IMPLANT
NEPTUNE MANIFOLD (MISCELLANEOUS) ×3 IMPLANT
PACK ARTHROSCOPY KNEE (MISCELLANEOUS) ×3 IMPLANT
PAD ABD DERMACEA PRESS 5X9 (GAUZE/BANDAGES/DRESSINGS) ×6 IMPLANT
PAD WRAPON POLAR KNEE (MISCELLANEOUS) ×1 IMPLANT
PADDING CAST BLEND 6X4 STRL (MISCELLANEOUS) ×1 IMPLANT
PADDING STRL CAST 6IN (MISCELLANEOUS) ×2
SET TUBE SUCT SHAVER OUTFL 24K (TUBING) ×3 IMPLANT
SET TUBE TIP INTRA-ARTICULAR (MISCELLANEOUS) ×3 IMPLANT
SUT ETHILON 3-0 FS-10 30 BLK (SUTURE) ×3
SUTURE EHLN 3-0 FS-10 30 BLK (SUTURE) ×1 IMPLANT
TOWEL OR 17X26 4PK STRL BLUE (TOWEL DISPOSABLE) ×3 IMPLANT
TUBING ARTHRO INFLOW-ONLY STRL (TUBING) ×3 IMPLANT
WAND HAND CNTRL MULTIVAC 90 (MISCELLANEOUS) ×3 IMPLANT
WRAPON POLAR PAD KNEE (MISCELLANEOUS) ×3

## 2017-10-23 NOTE — Transfer of Care (Signed)
Immediate Anesthesia Transfer of Care Note  Patient: Micheal Silva  Procedure(s) Performed: RIGHT KNEE ARTHROTOMY PARTIAL MEDIAL MENISSECTOMY PATELLOFEMORAL CHONDROPLASTY (Right )  Patient Location: PACU  Anesthesia Type: General  Level of Consciousness: awake, alert  and patient cooperative  Airway and Oxygen Therapy: Patient Spontanous Breathing and Patient connected to supplemental oxygen  Post-op Assessment: Post-op Vital signs reviewed, Patient's Cardiovascular Status Stable, Respiratory Function Stable, Patent Airway and No signs of Nausea or vomiting  Post-op Vital Signs: Reviewed and stable  Complications: No apparent anesthesia complications

## 2017-10-23 NOTE — Addendum Note (Signed)
Addendum  created 10/23/17 1449 by Lavonna Monarch, MD   Order list changed

## 2017-10-23 NOTE — Discharge Instructions (Signed)
Arthroscopic Knee Surgery - Partial Meniscectomy °  °Post-Op Instructions °  °1. Bracing or crutches: Crutches will be provided at the time of discharge from the surgery center if you do not already have them. °  °2. Ice: You may be provided with a device (Polar Care) that allows you to ice the affected area effectively. Otherwise you can ice manually.  °  °3. Driving:  Plan on not driving for at least two weeks. Please note that you are advised NOT to drive while taking narcotic pain medications as you may be impaired and unsafe to drive. °  °4. Activity: Ankle pumps several times an hour while awake to prevent blood clots. Weight bearing: as tolerated. Use crutches for as needed (usually ~1 week or less) until pain allows you to ambulate without a limp. Bending and straightening the knee is unlimited. Elevate knee above heart level as much as possible for one week. Avoid standing more than 5 minutes (consecutively) for the first week.  Avoid long distance travel for 2 weeks. ° °5. Medications:  °- You have been provided a prescription for narcotic pain medicine. After surgery, take 1-2 narcotic tablets every 4 hours if needed for severe pain.  °- You may take up to 3000mg/day of tylenol (acetaminophen). You can take 1000mg 3x/day. Please check your narcotic. If you have acetaminophen in your narcotic (each tablet will be 325mg), be careful not to exceed a total of 3000mg/day of acetaminophen.  °- A prescription for anti-nausea medication will be provided in case the narcotic medicine or anesthesia causes nausea - take 1 tablet every 6 hours only if nauseated.  °- Take ibuprofen 800 mg every 8 hours WITH food to reduce post-operative knee swelling. DO NOT STOP IBUPROFEN POST-OP UNTIL INSTRUCTED TO DO SO at first post-op office visit (10-14 days after surgery). However, please discontinue if you have any abdominal discomfort after taking this.  °- Take enteric coated aspirin 325 mg once daily for 2 weeks to prevent  blood clots.  ° ° °6. Bandages: The physical therapist should change the bandages at the first post-op appointment. If needed, the dressing supplies have been provided to you. °  °7. Physical Therapy: 1-2 times per week for 6 weeks. Therapy typically starts on post operative Day 3 or 4. You have been provided an order for physical therapy. The therapist will provide home exercises. °  °8. Work: May return to full work usually around 2 weeks after 1st post-operative visit. May do light duty/desk job in approximately 1-2 weeks when off of narcotics, pain is well-controlled, and swelling has decreased. Labor intensive jobs may require 4-6 weeks to return.  °  °  °9. Post-Op Appointments: °Your first post-op appointment will be with Dr. Marylu Dudenhoeffer in approximately 2 weeks time.  °  ° °If you find that they have not been scheduled please call the Orthopaedic Appointment front desk at [phone]. ° ° ° °General Anesthesia, Adult, Care After °These instructions provide you with information about caring for yourself after your procedure. Your health care provider may also give you more specific instructions. Your treatment has been planned according to current medical practices, but problems sometimes occur. Call your health care provider if you have any problems or questions after your procedure. °What can I expect after the procedure? °After the procedure, it is common to have: °· Vomiting. °· A sore throat. °· Mental slowness. ° °It is common to feel: °· Nauseous. °· Cold or shivery. °· Sleepy. °· Tired. °·   Sore or achy, even in parts of your body where you did not have surgery. ° °Follow these instructions at home: °For at least 24 hours after the procedure: °· Do not: °? Participate in activities where you could fall or become injured. °? Drive. °? Use heavy machinery. °? Drink alcohol. °? Take sleeping pills or medicines that cause drowsiness. °? Make important decisions or sign legal documents. °? Take care of children  on your own. °· Rest. °Eating and drinking °· If you vomit, drink water, juice, or soup when you can drink without vomiting. °· Drink enough fluid to keep your urine clear or pale yellow. °· Make sure you have little or no nausea before eating solid foods. °· Follow the diet recommended by your health care provider. °General instructions °· Have a responsible adult stay with you until you are awake and alert. °· Return to your normal activities as told by your health care provider. Ask your health care provider what activities are safe for you. °· Take over-the-counter and prescription medicines only as told by your health care provider. °· If you smoke, do not smoke without supervision. °· Keep all follow-up visits as told by your health care provider. This is important. °Contact a health care provider if: °· You continue to have nausea or vomiting at home, and medicines are not helpful. °· You cannot drink fluids or start eating again. °· You cannot urinate after 8-12 hours. °· You develop a skin rash. °· You have fever. °· You have increasing redness at the site of your procedure. °Get help right away if: °· You have difficulty breathing. °· You have chest pain. °· You have unexpected bleeding. °· You feel that you are having a life-threatening or urgent problem. °This information is not intended to replace advice given to you by your health care provider. Make sure you discuss any questions you have with your health care provider. °Document Released: 07/01/2000 Document Revised: 08/28/2015 Document Reviewed: 03/09/2015 °Elsevier Interactive Patient Education © 2018 [employer] ° °

## 2017-10-23 NOTE — Anesthesia Postprocedure Evaluation (Signed)
Anesthesia Post Note  Patient: Micheal Silva  Procedure(s) Performed: RIGHT KNEE ARTHROTOMY PARTIAL MEDIAL MENISSECTOMY PATELLOFEMORAL CHONDROPLASTY (Right )  Patient location during evaluation: PACU Anesthesia Type: General Level of consciousness: awake Vital Signs Assessment: post-procedure vital signs reviewed and stable Respiratory status: spontaneous breathing Cardiovascular status: blood pressure returned to baseline Postop Assessment: no headache Anesthetic complications: no    Lavonna Monarch

## 2017-10-23 NOTE — Anesthesia Procedure Notes (Signed)
Procedure Name: LMA Insertion Date/Time: 10/23/2017 1:14 PM Performed by: Mayme Genta, CRNA Pre-anesthesia Checklist: Patient identified, Emergency Drugs available, Suction available, Timeout performed and Patient being monitored Patient Re-evaluated:Patient Re-evaluated prior to induction Oxygen Delivery Method: Circle system utilized Preoxygenation: Pre-oxygenation with 100% oxygen Induction Type: IV induction LMA: LMA inserted LMA Size: 4.0 Number of attempts: 1 Placement Confirmation: positive ETCO2 and breath sounds checked- equal and bilateral Tube secured with: Tape

## 2017-10-23 NOTE — Op Note (Signed)
Operative Note    SURGERY DATE: 10/23/2017   PRE-OP DIAGNOSIS:  1. Right medial meniscus tear 2. Right patella degenerative changes  POST-OP DIAGNOSIS:  1. Right medial meniscus tear 2. Right patella degenerative changes 3. Right lateral compartment loose body   PROCEDURES:  1. Right knee arthroscopy, partial medial meniscectomy 2. Right chondroplasty of patellofemoral compartment    3. Right removal of loose body from lateral compartment   SURGEON: Cato Mulligan, MD   ANESTHESIA: Gen   ESTIMATED BLOOD LOSS: minimal   TOTAL IV FLUIDS: per anesthesia   INDICATION(S):  Micheal Silva is a 52 y.o. male with signs and symptoms as well as MRI finding of medial meniscus tear.  He had failed conservative management.  After discussion of risks, benefits, and alternatives to surgery, the patient elected to proceed.  We had a lengthy discussion given his patellofemoral degenerative changes and the patient understands that he may not get complete relief of pain due to his pre-existing arthritis.  He also understands that he may develop progressive medial compartment arthritis given his meniscus injury.   OPERATIVE FINDINGS:    Examination under anesthesia: A careful examination under anesthesia was performed.  Passive range of motion was: Hyperextension: 2.  Extension: 0.  Flexion: 130.  Lachman: normal. Pivot Shift: normal.  Posterior drawer: normal.  Varus stability in full extension: normal.  Varus stability in 30 degrees of flexion: normal.  Valgus stability in full extension: normal.  Valgus stability in 30 degrees of flexion: normal.   Intra-operative findings: A thorough arthroscopic examination of the knee was performed.  The findings are: 1. Suprapatellar pouch: Normal 2. Undersurface of median ridge: Grade 4 degenerative changes 3. Medial patellar facet: Grade 2 degenerative changes 4. Lateral patellar facet: Grade 4 degenerative changes 5. Trochlea: Grade 1 degenerative  changes 6. Lateral gutter/popliteus tendon: Normal 7. Hoffa's fat pad: Inflamed 8. Medial gutter/plica: Normal 9. ACL: Normal 10. PCL: Normal 11. Medial meniscus: Complex tear with both radial and horizontal components of the posterior horn and a tear at the level of the meniscus root. The complex tear affected ~70% of the meniscal width and there was a flipped fragment underneath the meniscus body. The tear of the meniscus root was essentially full-width thickness.  12. Medial compartment cartilage: Grade 1 degenerative changes 13. Lateral meniscus: normal 14. Lateral compartment cartilage: Grade 1-2 degenerative changes of the tibia. Loose body ~62mm in the lateral compartment.   OPERATIVE REPORT:     I identified Micheal Silva in the pre-operative holding area. I marked the operative knee with my initials. I reviewed the risks and benefits of the proposed surgical intervention and the patient (and/or patient's guardian) wished to proceed. The patient was transferred to the operative suite and placed in the supine position with all bony prominences padded. Anesthesia was administered. Appropriate IV antibiotics were administered prior to incision. The extremity was then prepped and draped in standard fashion. A time out was performed confirming the correct extremity, correct patient, and correct procedure.   Arthroscopy portals were marked. Local anesthetic was injected to the planned portal sites. The anterolateral portal was established with an 11 blade.      The arthroscope was placed in the anterolateral portal and then into the suprapatellar pouch.  A diagnostic knee scope was completed with the above findings. The medial meniscus tear was identified. The loose body in the lateral compartment was located under the posterior horn of the lateral meniscus.   Next the medial  portal was established under needle localization. The loose body was removed from the lateral compartment with an  arthroscopic grasper. It measured ~75mm in length. The MCL was pie-crusted to improve visualization of the posterior horn of the medial meniscus. The complex posterior horn component of the medial meniscal tear was irreparable and decision was made to debride it using an arthroscopic biter and an oscillating shaver. The meniscus root tear of the posterior horn was better visualized and its edges were debrided. I decided to not repair the medial meniscus root given that he had extensive irreparable tearing of the posterior horn that affected ~70% of his meniscal width. A chondroplasty was performed of the patella such that there were stable cartilage edges without any loose fragments of cartilage. Arthroscopic fluid was removed from the joint.   The portals were closed with 3-0 Nylon suture. Sterile dressings included Xeroform, 4x4s, Sof-Rol, and Bias wrap. A Polarcare was placed.  The patient was then awakened and taken to the PACU hemodynamically stable without complication.     POSTOPERATIVE PLAN: The patient will be discharged home today once they meet PACU criteria. Aspirin 325 mg daily was prescribed for 2 weeks for DVT prophylaxis.  Physical therapy will start on POD#3-4. Weight-bearing as tolerated. They will follow up in 2 weeks per protocol.

## 2017-10-23 NOTE — H&P (Signed)
Paper H&P to be scanned into permanent record. H&P reviewed. No significant changes noted.  

## 2017-10-23 NOTE — Addendum Note (Signed)
Addendum  created 10/23/17 1445 by Lavonna Monarch, MD   Order list changed

## 2017-10-23 NOTE — Anesthesia Preprocedure Evaluation (Addendum)
Anesthesia Evaluation  Patient identified by MRN, date of birth, ID band Patient awake    Reviewed: Allergy & Precautions, NPO status , Patient's Chart, lab work & pertinent test results, reviewed documented beta blocker date and time   Airway Mallampati: II  TM Distance: >3 FB Neck ROM: Full    Dental no notable dental hx.    Pulmonary sleep apnea ,    Pulmonary exam normal breath sounds clear to auscultation       Cardiovascular hypertension, Normal cardiovascular exam Rhythm:Regular Rate:Normal     Neuro/Psych Anxiety Depression Chronic pain, took suboxone   GI/Hepatic negative GI ROS, Neg liver ROS,   Endo/Other  diabetes  Renal/GU negative Renal ROS     Musculoskeletal  (+) Arthritis ,   Abdominal (+) + obese,   Peds  Hematology negative hematology ROS (+)   Anesthesia Other Findings   Reproductive/Obstetrics                            Anesthesia Physical Anesthesia Plan  ASA: II  Anesthesia Plan: General   Post-op Pain Management:    Induction: Intravenous  PONV Risk Score and Plan:   Airway Management Planned: LMA  Additional Equipment: None  Intra-op Plan:   Post-operative Plan:   Informed Consent: I have reviewed the patients History and Physical, chart, labs and discussed the procedure including the risks, benefits and alternatives for the proposed anesthesia with the patient or authorized representative who has indicated his/her understanding and acceptance.     Plan Discussed with: CRNA, Anesthesiologist and Surgeon  Anesthesia Plan Comments:         Anesthesia Quick Evaluation

## 2017-10-24 ENCOUNTER — Encounter: Payer: Self-pay | Admitting: Orthopedic Surgery

## 2018-11-10 ENCOUNTER — Encounter: Payer: Self-pay | Admitting: General Surgery

## 2019-04-11 IMAGING — MR MR KNEE*R* W/O CM
6 of 7 series · 31 of 40 positions shown · non-contrast
Comparison: None.

CLINICAL DATA: Medial and posterior right knee pain for few months.
No known injury.

EXAM:
MRI OF THE RIGHT KNEE WITHOUT CONTRAST
TECHNIQUE: Multiplanar, multisequence MR imaging of the knee was performed. No
intravenous contrast was administered.

[Series 5: PD fat-sat · axial · 3.0mm · 0.50mm/px · z∈[-71,+74]mm · 5 of 45 slices shown (1 of 4)]
[im 1/45]
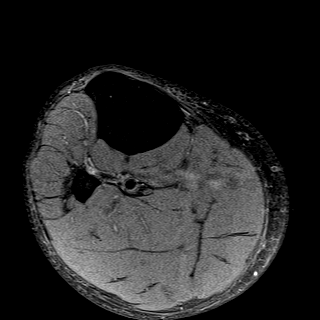
[im 12/45]
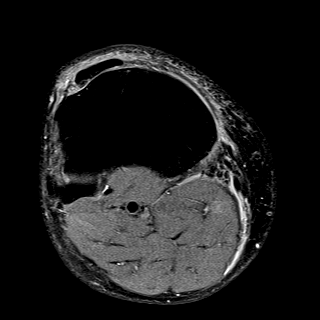
[im 23/45]
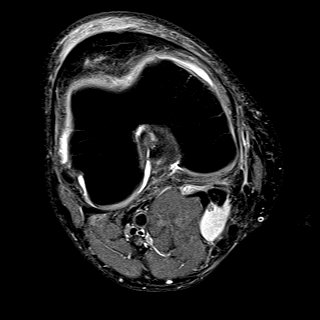
[im 34/45]
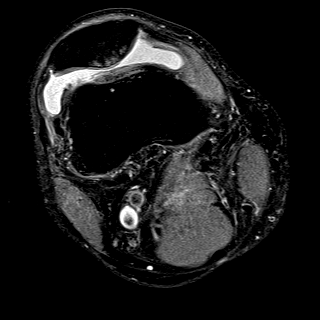
[im 45/45]
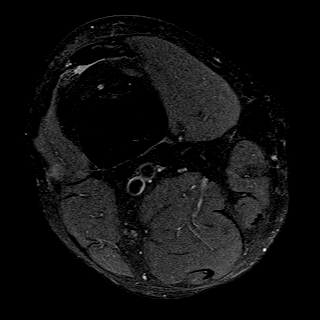

[Series 7: PD fat-sat · sagittal · 3.0mm · 0.50mm/px · 5 of 37 slices shown (2 of 4)]
[im 1/37]
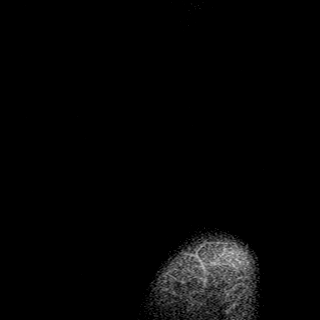
[im 10/37]
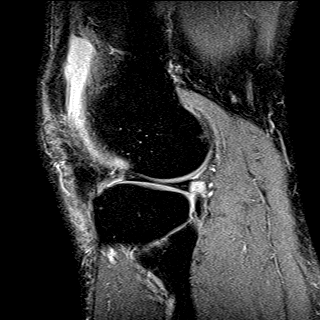
[im 19/37]
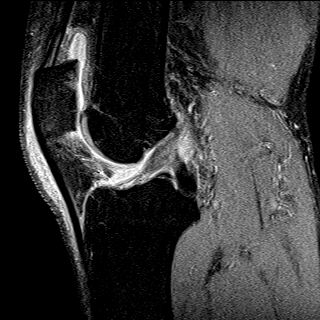
[im 28/37]
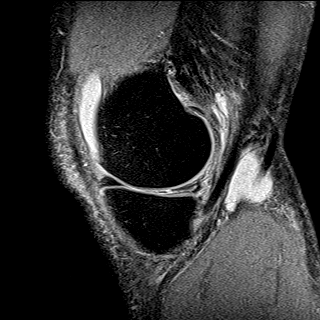
[im 37/37]
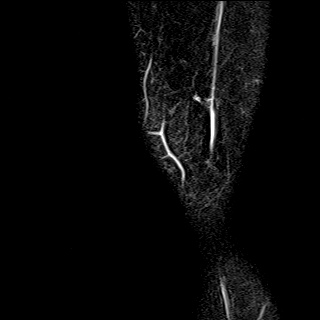

[Series 8: T2 fat-sat · coronal · 3.0mm · 0.31mm/px · 7 of 46 slices shown (1 of 2)]
[im 1/46]
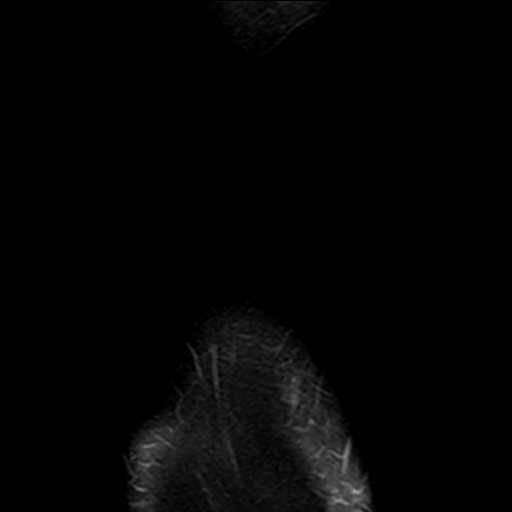
[im 8/46]
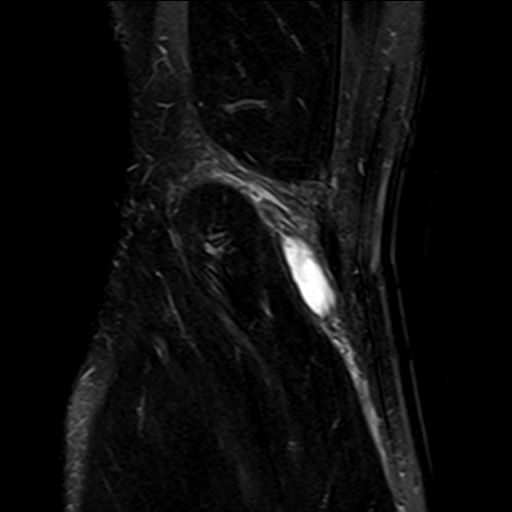
[im 16/46]
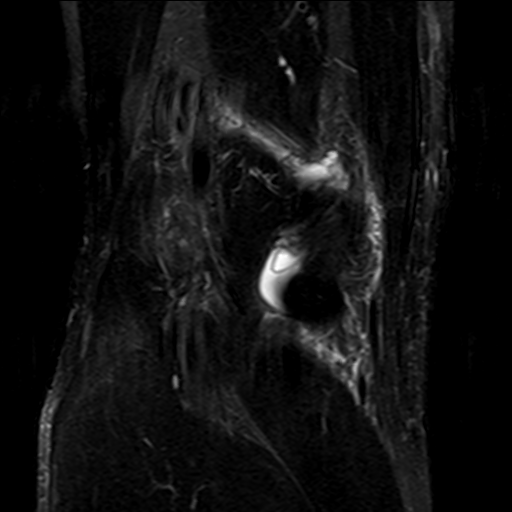
[im 23/46]
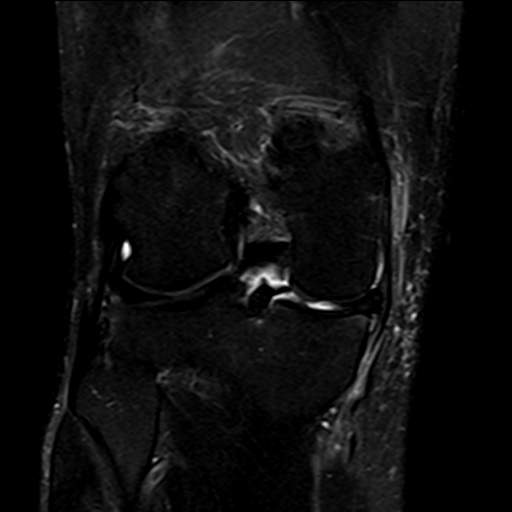
[im 31/46]
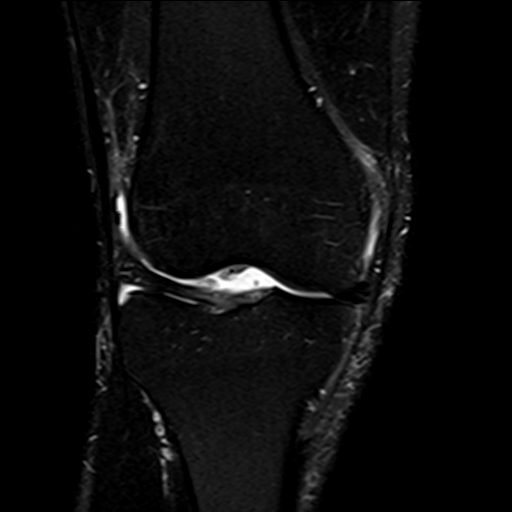
[im 38/46]
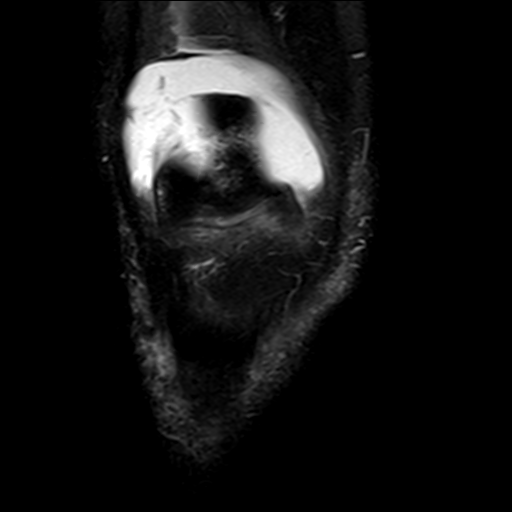
[im 46/46]
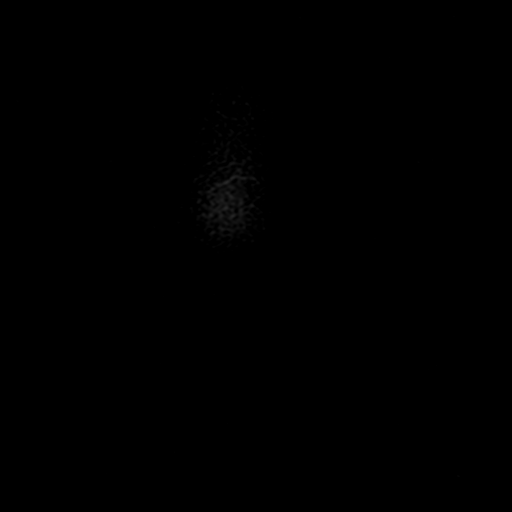

[Series 9: PD fat-sat · coronal · 3.0mm · 0.50mm/px · 7 of 46 slices shown (3 of 4)]
[im 1/46]
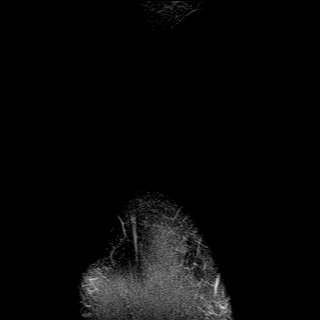
[im 8/46]
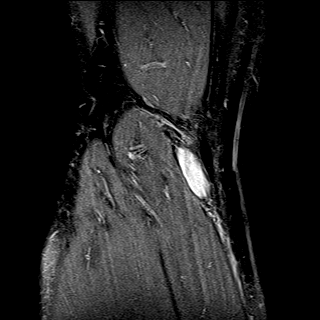
[im 16/46]
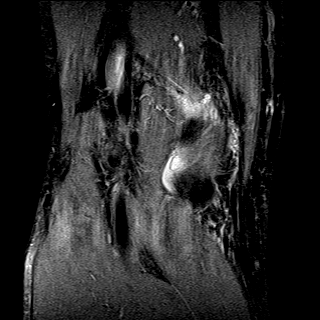
[im 23/46]
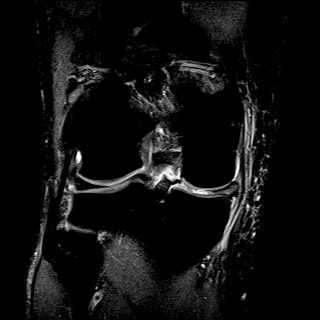
[im 31/46]
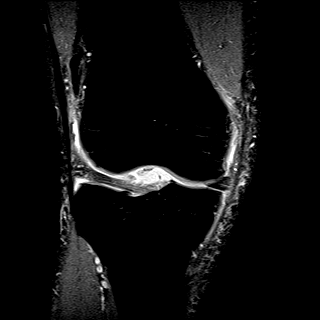
[im 38/46]
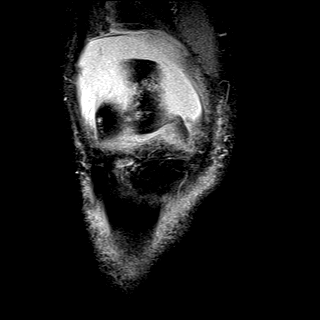
[im 46/46]
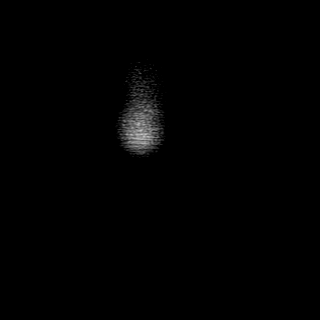

[Series 11: PD fat-sat · oblique · 2.0mm · 0.62mm/px · 4 of 25 slices shown (4 of 4)]
[im 1/25]
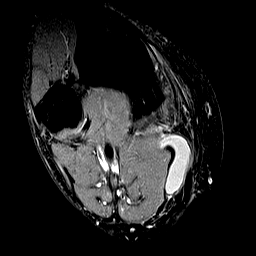
[im 9/25]
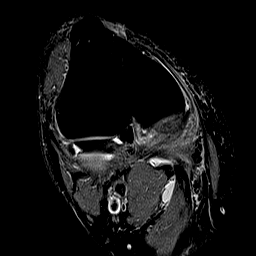
[im 17/25]
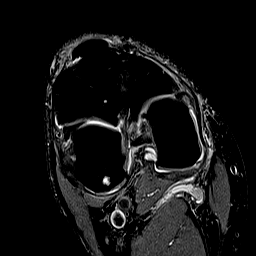
[im 25/25]
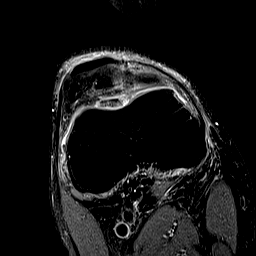

[Series 12: T2 fat-sat · sagittal · 3.0mm · 0.31mm/px · 3 of 37 slices shown (2 of 2)]
[im 1/37]
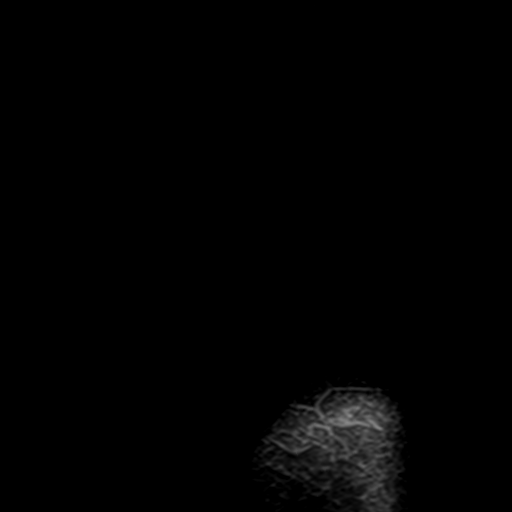
[im 10/37]
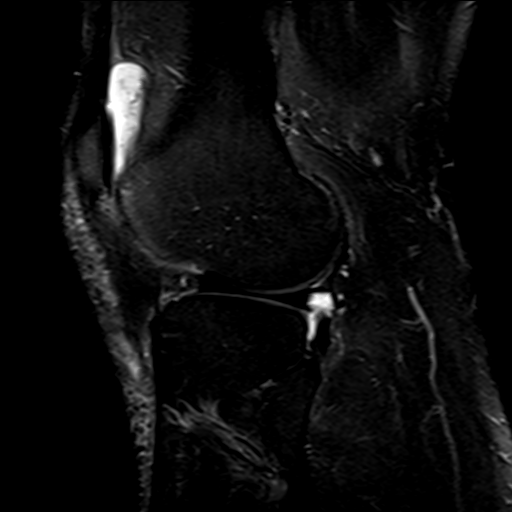
[im 19/37]
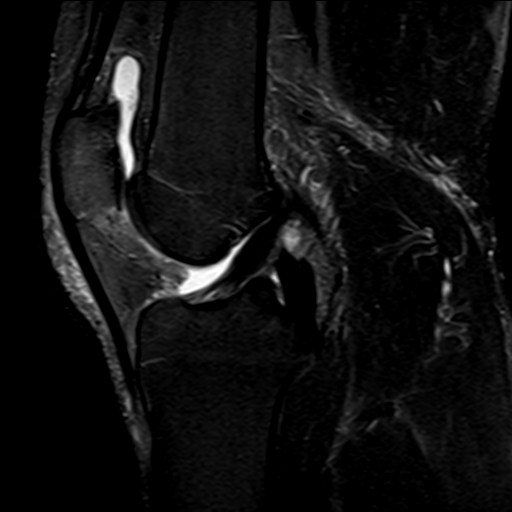

[31 of 40 positions shown; findings below may reference images not displayed]

FINDINGS: MENISCI

Medial meniscus: Complex tearing and body is seen in the posterior
horn and posterior body with both large horizontal and radial
components identified. No centrally displaced fragment.

Lateral meniscus: There is some degenerative signal present but no
tear.

LIGAMENTS

Cruciates:  Intact.  Mucoid degeneration of the ACL noted.

Collaterals:  Intact.

CARTILAGE

Patellofemoral: Extensive cartilage loss about the patella is
present diffusely with associated tiny subchondral cysts.

Medial:  Thinned and irregular throughout without focal defect.

Lateral:  Irregular without focal defect.

Joint:  Small effusion.

Popliteal Fossa: Baker's cyst measures approximately 1.2 cm
transverse by 2.3 cm AP by 5.2 cm craniocaudal.

Extensor Mechanism:  Intact.

Bones:  No fracture or worrisome lesion.

Other: Subcutaneous edema anterior to the patella and patellar
tendon is identified.
IMPRESSION: Extensive complex tearing posterior horn and posterior body of the
medial meniscus.

Tricompartmental osteoarthritis appears worst in the medial and
patellofemoral compartments.

Small Baker's cyst.

Pretibial/prepatellar bursitis.

## 2020-01-03 ENCOUNTER — Telehealth: Payer: Self-pay

## 2020-01-03 NOTE — Telephone Encounter (Signed)
Left message for patient to return call to office regarding scheduling colonoscopy- need to know if he wants to remain with Dr.Byrnett if not we need to place referral in epic to see [facility].

## 2021-03-07 NOTE — Progress Notes (Signed)
Sent message, via epic in basket, requesting orders in epic from surgeon.  

## 2021-03-16 NOTE — Patient Instructions (Addendum)
DUE TO COVID-19 ONLY ONE VISITOR IS ALLOWED TO COME WITH YOU AND STAY IN THE WAITING ROOM ONLY DURING PRE OP AND PROCEDURE.   **NO VISITORS ARE ALLOWED IN THE SHORT STAY AREA OR RECOVERY ROOM!!**   Your procedure is scheduled on: 03/28/21   Report to [facility] Main Entrance    Report to admitting at 6:15 AM   Call this number if you have problems the morning of surgery [phone]   Do not eat food :After Midnight.   May have liquids until 5:30 AM day of surgery  CLEAR LIQUID DIET  Foods Allowed                                                                     Foods Excluded  Water, Black Coffee and tea (no milk or creamer)           liquids that you cannot  Plain Jell-O in any flavor  (No red)                                    see through such as: Fruit ices (not with fruit pulp)                                            milk, soups, orange juice              Iced Popsicles (No red)                                                All solid food                                   Apple juices Sports drinks like Gatorade (No red) Lightly seasoned clear broth or consume(fat free) Sugar      The day of surgery:  Drink ONE (1) Pre-Surgery G2 by 5:30 am the morning of surgery. Drink in one sitting. Do not sip.  This drink was given to you during your hospital  pre-op appointment visit. Nothing else to drink after completing the  Pre-Surgery G2.          If you have questions, please contact your surgeon's office.     Oral Hygiene is also important to reduce your risk of infection.                                    Remember - BRUSH YOUR TEETH THE MORNING OF SURGERY WITH YOUR REGULAR TOOTHPASTE   Do NOT smoke after Midnight   Take these medicines the morning of surgery with A SIP OF WATER: Wellbutrin, Clonidine   DO NOT TAKE ANY ORAL DIABETIC MEDICATIONS DAY OF YOUR SURGERY  How to Manage Your Diabetes Before and After Surgery  Why is it  important to  control my blood sugar before and after surgery? Improving blood sugar levels before and after surgery helps healing and can limit problems. A way of improving blood sugar control is eating a healthy diet by:  Eating less sugar and carbohydrates  Increasing activity/exercise  Talking with your doctor about reaching your blood sugar goals High blood sugars (greater than 180 mg/dL) can raise your risk of infections and slow your recovery, so you will need to focus on controlling your diabetes during the weeks before surgery. Make sure that the doctor who takes care of your diabetes knows about your planned surgery including the date and location.  How do I manage my blood sugar before surgery? Check your blood sugar at least 4 times a day, starting 2 days before surgery, to make sure that the level is not too high or low. Check your blood sugar the morning of your surgery when you wake up and every 2 hours until you get to the Short Stay unit. If your blood sugar is less than 70 mg/dL, you will need to treat for low blood sugar: Do not take insulin. Treat a low blood sugar (less than 70 mg/dL) with  cup of clear juice (cranberry or apple), 4 glucose tablets, OR glucose gel. Recheck blood sugar in 15 minutes after treatment (to make sure it is greater than 70 mg/dL). If your blood sugar is not greater than 70 mg/dL on recheck, call [phone] for further instructions. Report your blood sugar to the short stay nurse when you get to Short Stay.  If you are admitted to the hospital after surgery: Your blood sugar will be checked by the staff and you will probably be given insulin after surgery (instead of oral diabetes medicines) to make sure you have good blood sugar levels. The goal for blood sugar control after surgery is 80-180 mg/dL.                               You may not have any metal on your body including jewelry, and body piercing             Do not wear lotions, powders, cologne,  or deodorant              Men may shave face and neck.   Do not bring valuables to the hospital. [place].    Patients discharged on the day of surgery will not be allowed to drive home.   Special Instructions: Bring a copy of your healthcare power of attorney and living will documents         the day of surgery if you haven't scanned them before.              Please read over the following fact sheets you were given: IF YOU HAVE QUESTIONS ABOUT YOUR PRE-OP INSTRUCTIONS PLEASE CALL [facility] - Preparing for Surgery Before surgery, you can play an important role.  Because skin is not sterile, your skin needs to be as free of germs as possible.  You can reduce the number of germs on your skin by washing with CHG (chlorahexidine gluconate) soap before surgery.  CHG is an antiseptic cleaner which kills germs and bonds with the skin to continue killing germs even after washing. Please DO NOT use  if you have an allergy to CHG or antibacterial soaps.  If your skin becomes reddened/irritated stop using the CHG and inform your nurse when you arrive at Short Stay. Do not shave (including legs and underarms) for at least 48 hours prior to the first CHG shower.  You may shave your face/neck.  Please follow these instructions carefully:  1.  Shower with CHG Soap the night before surgery and the  morning of surgery.  2.  If you choose to wash your hair, wash your hair first as usual with your normal  shampoo.  3.  After you shampoo, rinse your hair and body thoroughly to remove the shampoo.                             4.  Use CHG as you would any other liquid soap.  You can apply chg directly to the skin and wash.  Gently with a scrungie or clean washcloth.  5.  Apply the CHG Soap to your body ONLY FROM THE NECK DOWN.   Do   not use on face/ open                           Wound or open sores. Avoid contact with eyes, ears mouth and    genitals (private parts).                       Wash face,  Genitals (private parts) with your normal soap.             6.  Wash thoroughly, paying special attention to the area where your    surgery  will be performed.  7.  Thoroughly rinse your body with warm water from the neck down.  8.  DO NOT shower/wash with your normal soap after using and rinsing off the CHG Soap.                9.  Pat yourself dry with a clean towel.            10.  Wear clean pajamas.            11.  Place clean sheets on your bed the night of your first shower and do not  sleep with pets. Day of Surgery : Do not apply any lotions/deodorants the morning of surgery.  Please wear clean clothes to the hospital/surgery center.  FAILURE TO FOLLOW THESE INSTRUCTIONS MAY RESULT IN THE CANCELLATION OF YOUR SURGERY  PATIENT SIGNATURE_________________________________  NURSE SIGNATURE__________________________________  ________________________________________________________________________   Micheal Silva  An incentive spirometer is a tool that can help keep your lungs clear and active. This tool measures how well you are filling your lungs with each breath. Taking long deep breaths may help reverse or decrease the chance of developing breathing (pulmonary) problems (especially infection) following: A long period of time when you are unable to move or be active. BEFORE THE PROCEDURE  If the spirometer includes an indicator to show your best effort, your nurse or respiratory therapist will set it to a desired goal. If possible, sit up straight or lean slightly forward. Try not to slouch. Hold the incentive spirometer in an upright position. INSTRUCTIONS FOR USE  Sit on the edge of your bed if possible, or sit up as far as you can in bed or on a chair. Hold the incentive spirometer in an upright  position. Breathe out normally. Place the mouthpiece in your mouth and seal your lips tightly around it. Breathe in  slowly and as deeply as possible, raising the piston or the ball toward the top of the column. Hold your breath for 3-5 seconds or for as long as possible. Allow the piston or ball to fall to the bottom of the column. Remove the mouthpiece from your mouth and breathe out normally. Rest for a few seconds and repeat Steps 1 through 7 at least 10 times every 1-2 hours when you are awake. Take your time and take a few normal breaths between deep breaths. The spirometer may include an indicator to show your best effort. Use the indicator as a goal to work toward during each repetition. After each set of 10 deep breaths, practice coughing to be sure your lungs are clear. If you have an incision (the cut made at the time of surgery), support your incision when coughing by placing a pillow or rolled up towels firmly against it. Once you are able to get out of bed, walk around indoors and cough well. You may stop using the incentive spirometer when instructed by your caregiver.  RISKS AND COMPLICATIONS Take your time so you do not get dizzy or light-headed. If you are in pain, you may need to take or ask for pain medication before doing incentive spirometry. It is harder to take a deep breath if you are having pain. AFTER USE Rest and breathe slowly and easily. It can be helpful to keep track of a log of your progress. Your caregiver can provide you with a simple table to help with this. If you are using the spirometer at home, follow these instructions: [place] IF:  You are having difficultly using the spirometer. You have trouble using the spirometer as often as instructed. Your pain medication is not giving enough relief while using the spirometer. You develop fever of 100.5 F (38.1 C) or higher. SEEK IMMEDIATE MEDICAL CARE IF:  You cough up bloody sputum that had not been present before. You develop fever of 102 F (38.9 C) or greater. You develop worsening pain at or near the incision  site. MAKE SURE YOU:  Understand these instructions. Will watch your condition. Will get help right away if you are not doing well or get worse. Document Released: 08/05/2006 Document Revised: 06/17/2011 Document Reviewed: 10/06/2006 [employer] Patient Information 2014 [employer], [place].   ________________________________________________________________________   WHAT IS A BLOOD TRANSFUSION? Blood Transfusion Information  A transfusion is the replacement of blood or some of its parts. Blood is made up of multiple cells which provide different functions. Red blood cells carry oxygen and are used for blood loss replacement. White blood cells fight against infection. Platelets control bleeding. Plasma helps clot blood. Other blood products are available for specialized needs, such as hemophilia or other clotting disorders. BEFORE THE TRANSFUSION  Who gives blood for transfusions?  Healthy volunteers who are fully evaluated to make sure their blood is safe. This is blood bank blood. Transfusion therapy is the safest it has ever been in the practice of medicine. Before blood is taken from a donor, a complete history is taken to make sure that person has no history of diseases nor engages in risky social behavior (examples are intravenous drug use or sexual activity with multiple partners). The donor's travel history is screened to minimize risk of transmitting infections, such as malaria. The donated blood is tested for signs of infectious diseases, such  as HIV and hepatitis. The blood is then tested to be sure it is compatible with you in order to minimize the chance of a transfusion reaction. If you or a relative donates blood, this is often done in anticipation of surgery and is not appropriate for emergency situations. It takes many days to process the donated blood. RISKS AND COMPLICATIONS Although transfusion therapy is very safe and saves many lives, the main dangers of transfusion include:   Getting an infectious disease. Developing a transfusion reaction. This is an allergic reaction to something in the blood you were given. Every precaution is taken to prevent this. The decision to have a blood transfusion has been considered carefully by your caregiver before blood is given. Blood is not given unless the benefits outweigh the risks. AFTER THE TRANSFUSION Right after receiving a blood transfusion, you will usually feel much better and more energetic. This is especially true if your red blood cells have gotten low (anemic). The transfusion raises the level of the red blood cells which carry oxygen, and this usually causes an energy increase. The nurse administering the transfusion will monitor you carefully for complications. HOME CARE INSTRUCTIONS  No special instructions are needed after a transfusion. You may find your energy is better. Speak with your caregiver about any limitations on activity for underlying diseases you may have. SEEK MEDICAL CARE IF:  Your condition is not improving after your transfusion. You develop redness or irritation at the intravenous (IV) site. SEEK IMMEDIATE MEDICAL CARE IF:  Any of the following symptoms occur over the next 12 hours: Shaking chills. You have a temperature by mouth above 102 F (38.9 C), not controlled by medicine. Chest, back, or muscle pain. People around you feel you are not acting correctly or are confused. Shortness of breath or difficulty breathing. Dizziness and fainting. You get a rash or develop hives. You have a decrease in urine output. Your urine turns a dark color or changes to pink, red, or brown. Any of the following symptoms occur over the next 10 days: You have a temperature by mouth above 102 F (38.9 C), not controlled by medicine. Shortness of breath. Weakness after normal activity. The white part of the eye turns yellow (jaundice). You have a decrease in the amount of urine or are urinating less  often. Your urine turns a dark color or changes to pink, red, or brown. Document Released: 03/22/2000 Document Revised: 06/17/2011 Document Reviewed: 11/09/2007 [facility] Patient Information 2014 [place], [place].  _______________________________________________________________________

## 2021-03-16 NOTE — Progress Notes (Addendum)
COVID swab appointment: n/a  COVID Vaccine Completed: yes x2 Date COVID Vaccine completed: Has received booster: COVID vaccine manufacturer: [employer]   Date of COVID positive in last 90 days: no  PCP - Johny Drilling, MD Cardiologist - n/s  Clearance??  Chest x-ray - n/a EKG - 02/08/21 req [facility] Stress Test - 10/25/15 Care Everywhere ECHO - n/a Cardiac Cath - n/a Pacemaker/ICD device last checked: n/a Spinal Cord Stimulator: n/a  Sleep Study - yes positive CPAP - no had surgery, no longer uses  Fasting Blood Sugar - 120 Checks Blood Sugar _once a month, pre DM  Blood Thinner Instructions: n/a Aspirin Instructions: Last Dose:  Patient is on Suboxone. He will contact his doctor and ask for instructions before surgery  Activity level: Can go up a flight of stairs and perform activities of daily living without stopping and without symptoms of chest pain or shortness of breath.    Anesthesia review: OSA, HTN, DM 2, previous heroin use, heart murmur, on Suboxone  Patient denies shortness of breath, fever, cough and chest pain at PAT appointment   Patient verbalized understanding of instructions that were given to them at the PAT appointment. Patient was also instructed that they will need to review over the PAT instructions again at home before surgery.

## 2021-03-19 ENCOUNTER — Encounter (HOSPITAL_COMMUNITY): Payer: Self-pay

## 2021-03-19 ENCOUNTER — Encounter (HOSPITAL_COMMUNITY)
Admission: RE | Admit: 2021-03-19 | Discharge: 2021-03-19 | Disposition: A | Discharge status: Home / Self Care | Payer: BC Managed Care – PPO | Source: Ambulatory Visit | Attending: Specialist | Admitting: Specialist

## 2021-03-19 VITALS — BP 142/83 | HR 72 | Temp 98.6°F | Resp 14 | Ht 68.0 in | Wt 201.0 lb

## 2021-03-19 DIAGNOSIS — Z01818 Encounter for other preprocedural examination: Secondary | ICD-10-CM | POA: Insufficient documentation

## 2021-03-19 DIAGNOSIS — M1711 Unilateral primary osteoarthritis, right knee: Secondary | ICD-10-CM | POA: Diagnosis not present

## 2021-03-19 DIAGNOSIS — E119 Type 2 diabetes mellitus without complications: Secondary | ICD-10-CM | POA: Insufficient documentation

## 2021-03-19 LAB — COMPREHENSIVE METABOLIC PANEL
ALT: 23 U/L (ref 0–44)
AST: 23 U/L (ref 15–41)
Albumin: 4.2 g/dL (ref 3.5–5.0)
Alkaline Phosphatase: 66 U/L (ref 38–126)
Anion gap: 8 (ref 5–15)
BUN: 15 mg/dL (ref 6–20)
CO2: 27 mmol/L (ref 22–32)
Calcium: 9.1 mg/dL (ref 8.9–10.3)
Chloride: 100 mmol/L (ref 98–111)
Creatinine, Ser: 1.08 mg/dL (ref 0.61–1.24)
GFR, Estimated: >60 mL/min (ref >60)
Glucose, Bld: 231 mg/dL — ABNORMAL HIGH (ref 70–99)
Potassium: 3.8 mmol/L (ref 3.5–5.1)
Sodium: 135 mmol/L (ref 135–145)
Total Bilirubin: 0.6 mg/dL (ref 0.3–1.2)
Total Protein: 6.7 g/dL (ref 6.5–8.1)

## 2021-03-19 LAB — CBC
HCT: 43.4 % (ref 39.0–52.0)
Hemoglobin: 14.7 g/dL (ref 13.0–17.0)
MCH: 30.1 pg (ref 26.0–34.0)
MCHC: 33.9 g/dL (ref 30.0–36.0)
MCV: 88.9 fL (ref 80.0–100.0)
Platelets: 187 10*3/uL (ref 150–400)
RBC: 4.88 MIL/uL (ref 4.22–5.81)
RDW: 13.2 % (ref 11.5–15.5)
WBC: 3.9 10*3/uL — ABNORMAL LOW (ref 4.0–10.5)
nRBC: 0 % (ref 0.0–0.2)

## 2021-03-19 LAB — SURGICAL PCR SCREEN
MRSA, PCR: NEGATIVE
Staphylococcus aureus: NEGATIVE

## 2021-03-19 LAB — GLUCOSE, CAPILLARY: Glucose-Capillary: 218 mg/dL — ABNORMAL HIGH (ref 70–99)

## 2021-03-21 NOTE — Anesthesia Preprocedure Evaluation (Addendum)
Anesthesia Evaluation  Patient identified by MRN, date of birth, ID band Patient awake    Reviewed: Allergy & Precautions, NPO status , Patient's Chart, lab work & pertinent test results  History of Anesthesia Complications (+) DIFFICULT AIRWAY  Airway Mallampati: III  TM Distance: >3 FB Neck ROM: Full    Dental no notable dental hx.    Pulmonary sleep apnea and Continuous Positive Airway Pressure Ventilation ,    Pulmonary exam normal breath sounds clear to auscultation       Cardiovascular Exercise Tolerance: Good hypertension, Pt. on medications negative cardio ROS Normal cardiovascular exam Rhythm:Regular Rate:Normal     Neuro/Psych PSYCHIATRIC DISORDERS Anxiety Depression negative neurological ROS     GI/Hepatic negative GI ROS, Neg liver ROS,   Endo/Other  negative endocrine ROSdiabetes, Type 2  Renal/GU negative Renal ROS  negative genitourinary   Musculoskeletal  (+) Arthritis , Osteoarthritis,    Abdominal   Peds negative pediatric ROS (+)  Hematology negative hematology ROS (+)   Anesthesia Other Findings On suboxone  Reproductive/Obstetrics negative OB ROS                           Anesthesia Physical Anesthesia Plan  ASA: 3  Anesthesia Plan: Spinal, MAC and Regional   Post-op Pain Management:    Induction: Intravenous  PONV Risk Score and Plan: 1 and Propofol infusion, TIVA, Treatment may vary due to age or medical condition, Midazolam, Ondansetron and Dexamethasone  Airway Management Planned: Natural Airway and Simple Face Mask  Additional Equipment:   Intra-op Plan:   Post-operative Plan:   Informed Consent: I have reviewed the patients History and Physical, chart, labs and discussed the procedure including the risks, benefits and alternatives for the proposed anesthesia with the patient or authorized representative who has indicated his/her understanding and  acceptance.     Dental advisory given  Plan Discussed with: CRNA, Surgeon and Anesthesiologist  Anesthesia Plan Comments: (Spinal and adductor block. GA/LMA as backup plan. Glidescope available if intubation needed. Norton Blizzard, MD  )      Anesthesia Quick Evaluation

## 2021-03-21 NOTE — Progress Notes (Signed)
Anesthesia Chart Review   Case: [id number] Date/Time: 03/28/21 0815   Procedure: TOTAL KNEE ARTHROPLASTY (Right: Knee) - with adductor canal block 120   Anesthesia type: Spinal   Pre-op diagnosis: Right knee osteoarthritis   Location: [facility] ROOM 08 / [facility]   Surgeons: Sydnee Cabal, MD       DISCUSSION:55 y.o. never smoker with h/o HTN, DM II (A1C 6.7), sleep apnea, BPH, right knee OA scheduled for above procedure 03/28/2021 with Dr. Sydnee Cabal  Pt on Suboxone, advised to contact prescribing provider.   Medical history states difficult intubation.  Previous anesthesia notes reviewed, no complications noted with 10/23/2017 procedure.   Per 01/25/2016 anesthesia note, "Difficulty was anticipated, Difficult Airway-due to anterior larynx and Difficult Airway-due to immobile epiglottis. Future Recommendations: Recommend- induction with short-acting agent, and alternative techniques readily available Comments: Pt with TD <3cm, DL with miller 2, only obtained a grade 3 view and difficult to trap epiglottis, next DL with glidescope LoPro S4 /c rigid stylet, good visualization still anterior but able to pass tube with minor difficulty."  Anticipate pt can proceed with planned procedure barring acute status change.   VS: BP (!) 142/83    Pulse 72    Temp 37 C (Oral)    Resp 14    Ht 5\' 8"  (1.727 m)    Wt 91.2 kg    SpO2 100%    BMI 30.56 kg/m   PROVIDERS: Valera Castle, MD is PCP    LABS: Labs reviewed: Acceptable for surgery. (all labs ordered are listed, but only abnormal results are displayed)  Labs Reviewed  CBC - Abnormal; Notable for the following components:      Result Value   WBC 3.9 (*)    All other components within normal limits  COMPREHENSIVE METABOLIC PANEL - Abnormal; Notable for the following components:   Glucose, Bld 231 (*)    All other components within normal limits  GLUCOSE, CAPILLARY - Abnormal; Notable for the following components:   Glucose-Capillary  218 (*)    All other components within normal limits  SURGICAL PCR SCREEN  TYPE AND SCREEN     IMAGES:   EKG:   CV: Stress Echo 10/25/2015 INTERPRETATION ---------------------------------------------------------------    NORMAL STRESS TEST. NORMAL RESTING STUDY WITH NO WALL MOTION ABNORMALITIES   AT REST AND PEAK STRESS.   VALVULAR REGURGITATION: TRIVIAL MR, TRIVIAL TR   NO VALVULAR STENOSIS   Maximum workload of 13.40 METs was achieved during exercise.   NORMAL RESTING BP - APPROPRIATE RESPONSE  Past Medical History:  Diagnosis Date   Anxiety    Arthritis    HANDS BIL   BPH (benign prostatic hyperplasia)    Depression    Diabetes mellitus ([place])    type 2   Difficult intubation    Heart murmur    HTN (hypertension)    Hyperlipidemia    Hypogonadism in male    Insomnia    Obesity    Right-sided chest wall pain 12/25/2015   Sleep apnea    CPAP    Past Surgical History:  Procedure Laterality Date   APPENDECTOMY  2006   Dr. Tamala Julian   CHOLECYSTECTOMY  2002   [facility]-Dr. Tamala Julian   COLONOSCOPY  01/01/2016   COLONOSCOPY WITH PROPOFOL N/A 01/01/2016   Procedure: COLONOSCOPY WITH PROPOFOL;  Surgeon: [name], MD;  Location: [facility] ENDOSCOPY;  Service: Endoscopy;  Laterality: N/A;   COLONOSCOPY WITH PROPOFOL N/A 02/26/2017   Procedure: COLONOSCOPY WITH PROPOFOL;  Surgeon: Hervey Ard  W, MD;  Location: [facility] ENDOSCOPY;  Service: Endoscopy;  Laterality: N/A;   corrective surgery for sleep apnea  2005   ESOPHAGOGASTRODUODENOSCOPY (EGD) WITH PROPOFOL N/A 01/01/2016   Procedure: ESOPHAGOGASTRODUODENOSCOPY (EGD) WITH PROPOFOL;  Surgeon: [name], MD;  Location: [facility] ENDOSCOPY;  Service: Endoscopy;  Laterality: N/A;   KNEE ARTHROTOMY Right 10/23/2017   Procedure: RIGHT KNEE ARTHROTOMY PARTIAL MEDIAL MENISSECTOMY PATELLOFEMORAL CHONDROPLASTY;  Surgeon: Leim Fabry, MD;  Location: [place];  Service: Orthopedics;  Laterality: Right;  Diabetic - oral meds    LAPAROSCOPIC LYSIS OF ADHESIONS  01/25/2016   Procedure: LAPAROSCOPIC LYSIS OF ADHESIONS;  Surgeon: [name], MD;  Location: [facility] ORS;  Service: General;;   LAPAROSCOPY N/A 01/25/2016   Procedure: LAPAROSCOPY DIAGNOSTIC;  Surgeon: [name], MD;  Location: [facility] ORS;  Service: General;  Laterality: N/A;   TONSILLECTOMY  07/2003   palate also removed    MEDICATIONS:  buPROPion (WELLBUTRIN XL) 150 MG 24 hr tablet   cloNIDine (CATAPRES) 0.1 MG tablet   meloxicam (MOBIC) 15 MG tablet   Multiple Vitamin (MULTIVITAMIN) tablet   SUBOXONE 8-2 MG FILM   testosterone cypionate (DEPOTESTOTERONE CYPIONATE) 100 MG/ML injection   No current facility-administered medications for this encounter.     Konrad Felix Ward, PA-C [facility] [phone]

## 2021-03-21 NOTE — H&P (Signed)
TOTAL KNEE ADMISSION H&P  Patient is being admitted for right total knee arthroplasty.  Subjective:  Chief Complaint:right knee pain.  HPI: Micheal Silva, 55 y.o. male, has a history of pain and functional disability in the right knee due to arthritis and has failed non-surgical conservative treatments for greater than 12 weeks to includeNSAID's and/or analgesics.  Onset of symptoms was gradual, starting 3 years ago with gradually worsening course since that time. The patient noted prior procedures on the knee to include  arthroscopy and menisectomy on the right knee(s).  Patient currently rates pain in the right knee(s) at 5 out of 10 with activity. Patient has night pain, worsening of pain with activity and weight bearing, pain that interferes with activities of daily living, crepitus, and joint swelling.  Patient has evidence of subchondral sclerosis, periarticular osteophytes, and joint space narrowing by imaging studies. This patient has had  no previous injury . There is no active infection.  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 Tamala Julian   CHOLECYSTECTOMY  2002    [facility]-Dr. Tamala Julian   COLONOSCOPY  01/01/2016   COLONOSCOPY WITH PROPOFOL N/A 01/01/2016   Procedure: COLONOSCOPY WITH PROPOFOL;  Surgeon: [name], MD;  Location: [facility] ENDOSCOPY;  Service: Endoscopy;  Laterality: N/A;   COLONOSCOPY WITH PROPOFOL N/A 02/26/2017   Procedure: COLONOSCOPY WITH PROPOFOL;  Surgeon: [name], MD;  Location: [facility] ENDOSCOPY;  Service: Endoscopy;  Laterality: N/A;   corrective surgery for sleep apnea  2005   ESOPHAGOGASTRODUODENOSCOPY (EGD) WITH PROPOFOL N/A 01/01/2016   Procedure: ESOPHAGOGASTRODUODENOSCOPY (EGD) WITH PROPOFOL;  Surgeon: [name], MD;  Location: [facility] ENDOSCOPY;  Service: Endoscopy;  Laterality: N/A;   KNEE ARTHROTOMY Right 10/23/2017   Procedure: RIGHT KNEE ARTHROTOMY PARTIAL MEDIAL MENISSECTOMY PATELLOFEMORAL CHONDROPLASTY;  Surgeon: Leim Fabry, MD;  Location: [place];  Service: Orthopedics;  Laterality: Right;  Diabetic - oral meds   LAPAROSCOPIC LYSIS OF ADHESIONS  01/25/2016   Procedure: LAPAROSCOPIC LYSIS OF ADHESIONS;  Surgeon: [name], MD;  Location: [facility] ORS;  Service: General;;   LAPAROSCOPY N/A 01/25/2016   Procedure: LAPAROSCOPY DIAGNOSTIC;  Surgeon: [name], MD;  Location: [facility] ORS;  Service: General;  Laterality: N/A;   TONSILLECTOMY  07/2003   palate also removed    No current facility-administered medications for this encounter.   Current Outpatient Medications  Medication Sig Dispense Refill Last Dose   buPROPion (WELLBUTRIN XL) 150 MG 24 hr tablet Take 450 mg by mouth daily.      cloNIDine (  CATAPRES) 0.1 MG tablet Take 0.1 mg by mouth 2 (two) times daily.  1    meloxicam (MOBIC) 15 MG tablet Take 15 mg by mouth daily as needed for pain.      Multiple Vitamin (MULTIVITAMIN) tablet Take 1 tablet by mouth daily. Doterra - MitoMax      SUBOXONE 8-2 MG FILM Place 1 Film under the tongue in the morning and at bedtime.      testosterone cypionate (DEPOTESTOTERONE CYPIONATE) 100 MG/ML  injection Inject 50 mg into the muscle every 7 (seven) days. For IM use only      Allergies  Allergen Reactions   Ativan [Lorazepam] Other (See Comments)    Increased agitation   Crestor [Rosuvastatin Calcium]     Muscle aches   Vicodin [Hydrocodone-Acetaminophen] Nausea And Vomiting   Atorvastatin     Other reaction(s): Leg cramp    Social History   Tobacco Use   Smoking status: Never   Smokeless tobacco: Never  Substance Use Topics   Alcohol use: No    Alcohol/week: 0.0 standard drinks    Family History  Problem Relation Age of Onset   Prostate cancer Father    Leukemia Father    Diabetes Mellitus II Unknown    Coronary artery disease Unknown    Hypertension Unknown    Breast cancer Mother      Review of Systems  All other systems reviewed and are negative.  Objective:  Physical Exam Constitutional:      Appearance: Normal appearance. He is normal weight.  HENT:     Head: Normocephalic and atraumatic.  Eyes:     Extraocular Movements: Extraocular movements intact.  Cardiovascular:     Rate and Rhythm: Normal rate and regular rhythm.     Pulses: Normal pulses.     Heart sounds: Normal heart sounds.  Pulmonary:     Effort: Pulmonary effort is normal.     Breath sounds: Normal breath sounds.  Musculoskeletal:        General: Swelling and tenderness (medial and lateral joint line) present.     Cervical back: Normal range of motion.  Skin:    General: Skin is warm and dry.     Capillary Refill: Capillary refill takes less than 2 seconds.  Neurological:     General: No focal deficit present.     Mental Status: He is alert and oriented to person, place, and time.  Psychiatric:        Mood and Affect: Mood normal.        Behavior: Behavior normal.        Thought Content: Thought content normal.        Judgment: Judgment normal.    Vital signs in last 24 hours:    Labs:   Estimated body mass index is 30.56 kg/m as calculated from the following:    Height as of 03/19/21: 5\' 8"  (1.727 m).   Weight as of 03/19/21: 91.2 kg.   Imaging Review Plain radiographs demonstrate severe degenerative joint disease of the right knee(s). The overall alignment ismild varus. The bone quality appears to be fair for age and reported activity level.      Assessment/Plan:  End stage arthritis, right knee   The patient history, physical examination, clinical judgment of the provider and imaging studies are consistent with end stage degenerative joint disease of the right knee(s) and total knee arthroplasty is deemed medically necessary. The treatment options including medical management, injection therapy arthroscopy and arthroplasty were discussed  at length. The risks and benefits of total knee arthroplasty were presented and reviewed. The risks due to aseptic loosening, infection, stiffness, patella tracking problems, thromboembolic complications and other imponderables were discussed. The patient acknowledged the explanation, agreed to proceed with the plan and consent was signed. Patient is being admitted for inpatient treatment for surgery, pain control, PT, OT, prophylactic antibiotics, VTE prophylaxis, progressive ambulation and ADL's and discharge planning. The patient is planning to be discharged  home with family and will be doing outpatient PT     Patient's anticipated LOS is less than 2 midnights, meeting these requirements: - Younger than 70 - Lives within 1 hour of care - Has a competent adult at home to recover with post-op recover - NO history of  - Chronic pain requiring opiods  - Diabetes  - Coronary Artery Disease  - Heart failure  - Heart attack  - Stroke  - DVT/VTE  - Cardiac arrhythmia  - Respiratory Failure/COPD  - Renal failure  - Anemia  - Advanced Liver disease

## 2021-03-28 ENCOUNTER — Encounter (HOSPITAL_COMMUNITY): Payer: Self-pay | Admitting: Specialist

## 2021-03-28 ENCOUNTER — Other Ambulatory Visit: Payer: Self-pay

## 2021-03-28 ENCOUNTER — Ambulatory Visit (HOSPITAL_COMMUNITY): Payer: BC Managed Care – PPO | Admitting: Physician Assistant

## 2021-03-28 ENCOUNTER — Observation Stay (HOSPITAL_COMMUNITY)
Admission: RE | Admit: 2021-03-28 | Discharge: 2021-03-29 | Disposition: A | Discharge status: Home / Self Care | Payer: BC Managed Care – PPO | Attending: Specialist | Admitting: Specialist

## 2021-03-28 ENCOUNTER — Ambulatory Visit (HOSPITAL_COMMUNITY): Payer: BC Managed Care – PPO | Admitting: Certified Registered Nurse Anesthetist

## 2021-03-28 ENCOUNTER — Encounter (HOSPITAL_COMMUNITY)
Admission: RE | Disposition: A | Discharge status: Home / Self Care | Payer: Self-pay | Source: Home / Self Care | Attending: Specialist

## 2021-03-28 DIAGNOSIS — M1711 Unilateral primary osteoarthritis, right knee: Principal | ICD-10-CM | POA: Diagnosis present

## 2021-03-28 DIAGNOSIS — E1165 Type 2 diabetes mellitus with hyperglycemia: Secondary | ICD-10-CM | POA: Diagnosis not present

## 2021-03-28 DIAGNOSIS — Z9989 Dependence on other enabling machines and devices: Secondary | ICD-10-CM | POA: Insufficient documentation

## 2021-03-28 DIAGNOSIS — I10 Essential (primary) hypertension: Secondary | ICD-10-CM | POA: Insufficient documentation

## 2021-03-28 DIAGNOSIS — Z79899 Other long term (current) drug therapy: Secondary | ICD-10-CM | POA: Insufficient documentation

## 2021-03-28 DIAGNOSIS — G4733 Obstructive sleep apnea (adult) (pediatric): Secondary | ICD-10-CM | POA: Diagnosis not present

## 2021-03-28 HISTORY — PX: TOTAL KNEE ARTHROPLASTY: SHX125

## 2021-03-28 LAB — CREATININE, SERUM
Creatinine, Ser: 0.95 mg/dL (ref 0.61–1.24)
GFR, Estimated: >60 mL/min (ref >60)

## 2021-03-28 LAB — CBC
HCT: 43.8 % (ref 39.0–52.0)
Hemoglobin: 15.1 g/dL (ref 13.0–17.0)
MCH: 30.4 pg (ref 26.0–34.0)
MCHC: 34.5 g/dL (ref 30.0–36.0)
MCV: 88.1 fL (ref 80.0–100.0)
Platelets: 232 10*3/uL (ref 150–400)
RBC: 4.97 MIL/uL (ref 4.22–5.81)
RDW: 13.2 % (ref 11.5–15.5)
WBC: 8.4 10*3/uL (ref 4.0–10.5)
nRBC: 0 % (ref 0.0–0.2)

## 2021-03-28 LAB — TYPE AND SCREEN
ABO/RH(D): A POS
Antibody Screen: NEGATIVE

## 2021-03-28 LAB — GLUCOSE, CAPILLARY
Glucose-Capillary: 187 mg/dL — ABNORMAL HIGH (ref 70–99)
Glucose-Capillary: 204 mg/dL — ABNORMAL HIGH (ref 70–99)

## 2021-03-28 LAB — HIV ANTIBODY (ROUTINE TESTING W REFLEX): HIV Screen 4th Generation wRfx: NONREACTIVE

## 2021-03-28 LAB — ABO/RH: ABO/RH(D): A POS

## 2021-03-28 SURGERY — ARTHROPLASTY, KNEE, TOTAL
Anesthesia: Monitor Anesthesia Care | Site: Knee | Laterality: Right

## 2021-03-28 MED ORDER — LACTATED RINGERS IV SOLN: INTRAVENOUS | Status: DC

## 2021-03-28 MED ORDER — TRANEXAMIC ACID-NACL 1000-0.7 MG/100ML-% IV SOLN
1000.0000 mg | INTRAVENOUS | Status: AC
  Administered 2021-03-28: 09:00:00 1000 mg via INTRAVENOUS
  Filled 2021-03-28: qty 100

## 2021-03-28 MED ORDER — POVIDONE-IODINE 10 % EX SWAB
2.0000 "application " | Freq: Once | CUTANEOUS | Status: AC
  Administered 2021-03-28: 2 via TOPICAL

## 2021-03-28 MED ORDER — ONDANSETRON HCL 4 MG/2ML IJ SOLN
INTRAMUSCULAR | Status: AC
  Filled 2021-03-28: qty 2

## 2021-03-28 MED ORDER — SODIUM CHLORIDE (PF) 0.9 % IJ SOLN
INTRAMUSCULAR | Status: AC
  Filled 2021-03-28: qty 10

## 2021-03-28 MED ORDER — FENTANYL CITRATE PF 50 MCG/ML IJ SOSY: 25.0000 ug | PREFILLED_SYRINGE | INTRAMUSCULAR | Status: DC | PRN

## 2021-03-28 MED ORDER — SODIUM CHLORIDE 0.9 % IR SOLN
Status: DC | PRN
  Administered 2021-03-28: 1000 mL

## 2021-03-28 MED ORDER — BISACODYL 5 MG PO TBEC
5.0000 mg | DELAYED_RELEASE_TABLET | Freq: Every day | ORAL | Status: DC | PRN
  Filled 2021-03-28: qty 1

## 2021-03-28 MED ORDER — ORAL CARE MOUTH RINSE: 15.0000 mL | Freq: Once | OROMUCOSAL | Status: AC

## 2021-03-28 MED ORDER — SENNOSIDES-DOCUSATE SODIUM 8.6-50 MG PO TABS
1.0000 | ORAL_TABLET | Freq: Every evening | ORAL | Status: DC | PRN
  Filled 2021-03-28: qty 1

## 2021-03-28 MED ORDER — SODIUM CHLORIDE (PF) 0.9 % IJ SOLN
INTRAMUSCULAR | Status: DC | PRN
  Administered 2021-03-28: 60 mL

## 2021-03-28 MED ORDER — ACETAMINOPHEN 325 MG PO TABS: 325.0000 mg | ORAL_TABLET | Freq: Four times a day (QID) | ORAL | Status: DC | PRN

## 2021-03-28 MED ORDER — MIDAZOLAM HCL 2 MG/2ML IJ SOLN
INTRAMUSCULAR | Status: AC
  Filled 2021-03-28: qty 2

## 2021-03-28 MED ORDER — BUPIVACAINE LIPOSOME 1.3 % IJ SUSP
INTRAMUSCULAR | Status: DC | PRN
  Administered 2021-03-28: 20 mL

## 2021-03-28 MED ORDER — ONDANSETRON HCL 4 MG PO TABS
4.0000 mg | ORAL_TABLET | Freq: Four times a day (QID) | ORAL | Status: DC | PRN
  Filled 2021-03-28: qty 1

## 2021-03-28 MED ORDER — FENTANYL CITRATE (PF) 100 MCG/2ML IJ SOLN
INTRAMUSCULAR | Status: DC | PRN
  Administered 2021-03-28 (×2): 25 ug via INTRAVENOUS
  Administered 2021-03-28 (×2): 50 ug via INTRAVENOUS
  Administered 2021-03-28 (×2): 25 ug via INTRAVENOUS

## 2021-03-28 MED ORDER — HYDROMORPHONE HCL 1 MG/ML IJ SOLN
0.5000 mg | INTRAMUSCULAR | Status: DC | PRN
  Administered 2021-03-28: 17:00:00 1 mg via INTRAVENOUS
  Filled 2021-03-28: qty 1

## 2021-03-28 MED ORDER — CHLORHEXIDINE GLUCONATE 0.12 % MT SOLN
15.0000 mL | Freq: Once | OROMUCOSAL | Status: AC
  Administered 2021-03-28: 07:00:00 15 mL via OROMUCOSAL

## 2021-03-28 MED ORDER — FENTANYL CITRATE (PF) 100 MCG/2ML IJ SOLN
INTRAMUSCULAR | Status: AC
  Filled 2021-03-28: qty 2

## 2021-03-28 MED ORDER — DROPERIDOL 2.5 MG/ML IJ SOLN: 0.6250 mg | Freq: Once | INTRAMUSCULAR | Status: DC | PRN

## 2021-03-28 MED ORDER — PROPOFOL 500 MG/50ML IV EMUL
INTRAVENOUS | Status: AC
  Filled 2021-03-28: qty 50

## 2021-03-28 MED ORDER — ACETAMINOPHEN 500 MG PO TABS
1000.0000 mg | ORAL_TABLET | Freq: Four times a day (QID) | ORAL | Status: AC
  Administered 2021-03-28 – 2021-03-29 (×3): 1000 mg via ORAL
  Filled 2021-03-28 (×4): qty 2

## 2021-03-28 MED ORDER — PHENYLEPHRINE HCL-NACL 20-0.9 MG/250ML-% IV SOLN
INTRAVENOUS | Status: DC | PRN
  Administered 2021-03-28: 30 ug/min via INTRAVENOUS

## 2021-03-28 MED ORDER — METOCLOPRAMIDE HCL 5 MG/ML IJ SOLN
10.0000 mg | Freq: Four times a day (QID) | INTRAMUSCULAR | Status: DC
  Administered 2021-03-28: 18:00:00 10 mg via INTRAVENOUS
  Filled 2021-03-28: qty 2

## 2021-03-28 MED ORDER — OXYCODONE HCL 5 MG PO TABS
5.0000 mg | ORAL_TABLET | ORAL | Status: DC | PRN
  Administered 2021-03-28 (×2): 10 mg via ORAL
  Filled 2021-03-28 (×2): qty 2

## 2021-03-28 MED ORDER — KETOROLAC TROMETHAMINE 30 MG/ML IJ SOLN
30.0000 mg | Freq: Four times a day (QID) | INTRAMUSCULAR | Status: DC
  Administered 2021-03-28 – 2021-03-29 (×3): 30 mg via INTRAVENOUS
  Filled 2021-03-28 (×3): qty 1

## 2021-03-28 MED ORDER — BUPIVACAINE LIPOSOME 1.3 % IJ SUSP: 20.0000 mL | Freq: Once | INTRAMUSCULAR | Status: DC

## 2021-03-28 MED ORDER — METHOCARBAMOL 500 MG IVPB - SIMPLE MED
500.0000 mg | Freq: Four times a day (QID) | INTRAVENOUS | Status: DC | PRN
  Filled 2021-03-28: qty 50

## 2021-03-28 MED ORDER — DEXAMETHASONE SODIUM PHOSPHATE 10 MG/ML IJ SOLN
8.0000 mg | Freq: Once | INTRAMUSCULAR | Status: AC
  Administered 2021-03-28: 09:00:00 8 mg via INTRAVENOUS

## 2021-03-28 MED ORDER — IRRISEPT - 450ML BOTTLE WITH 0.05% CHG IN STERILE WATER, USP 99.95% OPTIME
TOPICAL | Status: DC | PRN
  Administered 2021-03-28: 09:00:00 450 mL

## 2021-03-28 MED ORDER — CLONIDINE HCL 0.1 MG PO TABS
0.1000 mg | ORAL_TABLET | Freq: Two times a day (BID) | ORAL | Status: DC
  Administered 2021-03-28 – 2021-03-29 (×2): 0.1 mg via ORAL
  Filled 2021-03-28 (×2): qty 1

## 2021-03-28 MED ORDER — BUPIVACAINE IN DEXTROSE 0.75-8.25 % IT SOLN
INTRATHECAL | Status: DC | PRN
  Administered 2021-03-28: 1.8 mL via INTRATHECAL

## 2021-03-28 MED ORDER — DEXAMETHASONE SODIUM PHOSPHATE 10 MG/ML IJ SOLN
INTRAMUSCULAR | Status: AC
  Filled 2021-03-28: qty 1

## 2021-03-28 MED ORDER — TRAMADOL HCL 50 MG PO TABS
50.0000 mg | ORAL_TABLET | Freq: Four times a day (QID) | ORAL | Status: DC
  Administered 2021-03-28 – 2021-03-29 (×2): 50 mg via ORAL
  Filled 2021-03-28 (×2): qty 1

## 2021-03-28 MED ORDER — PROPOFOL 10 MG/ML IV BOLUS
INTRAVENOUS | Status: DC | PRN
  Administered 2021-03-28: 30 mg via INTRAVENOUS

## 2021-03-28 MED ORDER — ACETAMINOPHEN 500 MG PO TABS
1000.0000 mg | ORAL_TABLET | Freq: Once | ORAL | Status: AC
  Administered 2021-03-28: 07:00:00 1000 mg via ORAL
  Filled 2021-03-28: qty 2

## 2021-03-28 MED ORDER — GELATIN ABSORBABLE 12-7 MM EX MISC
CUTANEOUS | Status: DC | PRN
  Administered 2021-03-28: 1 via TOPICAL

## 2021-03-28 MED ORDER — ONDANSETRON HCL 4 MG/2ML IJ SOLN
INTRAMUSCULAR | Status: DC | PRN
  Administered 2021-03-28: 4 mg via INTRAVENOUS

## 2021-03-28 MED ORDER — CEFAZOLIN SODIUM-DEXTROSE 2-4 GM/100ML-% IV SOLN
2.0000 g | INTRAVENOUS | Status: AC
  Administered 2021-03-28: 09:00:00 2 g via INTRAVENOUS
  Filled 2021-03-28: qty 100

## 2021-03-28 MED ORDER — METHOCARBAMOL 500 MG PO TABS
500.0000 mg | ORAL_TABLET | Freq: Four times a day (QID) | ORAL | Status: DC | PRN
  Administered 2021-03-28 (×2): 500 mg via ORAL
  Filled 2021-03-28 (×2): qty 1

## 2021-03-28 MED ORDER — CEFAZOLIN SODIUM-DEXTROSE 2-4 GM/100ML-% IV SOLN
2.0000 g | Freq: Three times a day (TID) | INTRAVENOUS | Status: AC
  Administered 2021-03-28 – 2021-03-29 (×3): 2 g via INTRAVENOUS
  Filled 2021-03-28 (×3): qty 100

## 2021-03-28 MED ORDER — METOCLOPRAMIDE HCL 5 MG/ML IJ SOLN: 10.0000 mg | Freq: Three times a day (TID) | INTRAMUSCULAR | Status: DC | PRN

## 2021-03-28 MED ORDER — ONDANSETRON HCL 4 MG/2ML IJ SOLN
4.0000 mg | Freq: Four times a day (QID) | INTRAMUSCULAR | Status: DC | PRN
  Administered 2021-03-28: 14:00:00 4 mg via INTRAVENOUS
  Filled 2021-03-28: qty 2

## 2021-03-28 MED ORDER — ONDANSETRON HCL 4 MG/2ML IJ SOLN: 4.0000 mg | Freq: Once | INTRAMUSCULAR | Status: DC | PRN

## 2021-03-28 MED ORDER — DIPHENHYDRAMINE HCL 12.5 MG/5ML PO ELIX
12.5000 mg | ORAL_SOLUTION | ORAL | Status: DC | PRN
  Administered 2021-03-28: 23:00:00 25 mg via ORAL
  Filled 2021-03-28 (×2): qty 10

## 2021-03-28 MED ORDER — MIDAZOLAM HCL 5 MG/5ML IJ SOLN
INTRAMUSCULAR | Status: DC | PRN
  Administered 2021-03-28: 2 mg via INTRAVENOUS

## 2021-03-28 MED ORDER — PROPOFOL 500 MG/50ML IV EMUL
INTRAVENOUS | Status: DC | PRN
  Administered 2021-03-28: 100 ug/kg/min via INTRAVENOUS

## 2021-03-28 MED ORDER — BUPROPION HCL ER (XL) 300 MG PO TB24
450.0000 mg | ORAL_TABLET | Freq: Every day | ORAL | Status: DC
  Administered 2021-03-29: 11:00:00 450 mg via ORAL
  Filled 2021-03-28: qty 1

## 2021-03-28 MED ORDER — BUPIVACAINE HCL (PF) 0.5 % IJ SOLN
INTRAMUSCULAR | Status: DC | PRN
  Administered 2021-03-28: 30 mL

## 2021-03-28 MED ORDER — STERILE WATER FOR IRRIGATION IR SOLN
Status: DC | PRN
  Administered 2021-03-28: 2000 mL

## 2021-03-28 MED ORDER — GABAPENTIN 300 MG PO CAPS
300.0000 mg | ORAL_CAPSULE | Freq: Every day | ORAL | Status: DC
  Administered 2021-03-28: 20:00:00 300 mg via ORAL
  Filled 2021-03-28: qty 1

## 2021-03-28 MED ORDER — SODIUM CHLORIDE 0.9 % IV SOLN: INTRAVENOUS | Status: DC

## 2021-03-28 MED ORDER — 0.9 % SODIUM CHLORIDE (POUR BTL) OPTIME
TOPICAL | Status: DC | PRN
  Administered 2021-03-28: 09:00:00 1000 mL

## 2021-03-28 MED ORDER — ENOXAPARIN SODIUM 40 MG/0.4ML IJ SOSY
40.0000 mg | PREFILLED_SYRINGE | INTRAMUSCULAR | Status: DC
  Administered 2021-03-29: 11:00:00 40 mg via SUBCUTANEOUS
  Filled 2021-03-28: qty 0.4

## 2021-03-28 MED ORDER — BUPIVACAINE LIPOSOME 1.3 % IJ SUSP
INTRAMUSCULAR | Status: AC
  Filled 2021-03-28: qty 20

## 2021-03-28 MED ORDER — OXYCODONE HCL 5 MG PO TABS
10.0000 mg | ORAL_TABLET | ORAL | Status: DC | PRN
  Administered 2021-03-28 – 2021-03-29 (×3): 15 mg via ORAL
  Filled 2021-03-28 (×3): qty 3

## 2021-03-28 SURGICAL SUPPLY — 57 items
ATTUNE MED DOME PAT 41 KNEE (Knees) ×1 IMPLANT
ATTUNE MED DOME PAT 41MM KNEE (Knees) ×1 IMPLANT
ATTUNE PS FEM RT SZ 7 CEM KNEE (Femur) ×2 IMPLANT
ATTUNE PSRP INSR SZ7 8 KNEE (Insert) ×1 IMPLANT
ATTUNE PSRP INSR SZ7 8MM KNEE (Insert) ×1 IMPLANT
BAG COUNTER SPONGE SURGICOUNT (BAG) ×2 IMPLANT
BAG SURGICOUNT SPONGE COUNTING (BAG) ×1
BAG ZIPLOCK 12X15 (MISCELLANEOUS) ×3 IMPLANT
BASE TIBIAL ROT PLAT SZ 8 KNEE (Knees) IMPLANT
BLADE SAG 18X100X1.27 (BLADE) ×3 IMPLANT
BLADE SAW SGTL 11.0X1.19X90.0M (BLADE) ×3 IMPLANT
BNDG ELASTIC 4X5.8 VLCR STR LF (GAUZE/BANDAGES/DRESSINGS) ×3 IMPLANT
BNDG ELASTIC 6X5.8 VLCR STR LF (GAUZE/BANDAGES/DRESSINGS) ×3 IMPLANT
BOWL SMART MIX CTS (DISPOSABLE) ×3 IMPLANT
CEMENT HV SMART SET (Cement) ×4 IMPLANT
COVER SURGICAL LIGHT HANDLE (MISCELLANEOUS) ×3 IMPLANT
CUFF TOURN SGL QUICK 34 (TOURNIQUET CUFF) ×2
CUFF TRNQT CYL 34X4.125X (TOURNIQUET CUFF) ×1 IMPLANT
DECANTER SPIKE VIAL GLASS SM (MISCELLANEOUS) ×3 IMPLANT
DERMABOND ADVANCED (GAUZE/BANDAGES/DRESSINGS) ×2
DERMABOND ADVANCED .7 DNX12 (GAUZE/BANDAGES/DRESSINGS) ×1 IMPLANT
DRAPE INCISE IOBAN 66X45 STRL (DRAPES) ×9 IMPLANT
DRAPE U-SHAPE 47X51 STRL (DRAPES) ×3 IMPLANT
DRSG AQUACEL AG ADV 3.5X10 (GAUZE/BANDAGES/DRESSINGS) ×3 IMPLANT
DURAPREP 26ML APPLICATOR (WOUND CARE) ×6 IMPLANT
ELECT REM PT RETURN 15FT ADLT (MISCELLANEOUS) ×3 IMPLANT
GLOVE SRG 8 PF TXTR STRL LF DI (GLOVE) ×1 IMPLANT
GLOVE SURG NEOPR MICRO LF SZ8 (GLOVE) ×3 IMPLANT
GLOVE SURG ORTHO LTX SZ9 (GLOVE) ×3 IMPLANT
GLOVE SURG POLYISO LF SZ7 (GLOVE) ×3 IMPLANT
GLOVE SURG UNDER POLY LF SZ7.5 (GLOVE) ×3 IMPLANT
GLOVE SURG UNDER POLY LF SZ8 (GLOVE) ×2
GOWN STRL REUS W/TWL XL LVL3 (GOWN DISPOSABLE) ×6 IMPLANT
HANDPIECE INTERPULSE COAX TIP (DISPOSABLE) ×2
KIT TURNOVER KIT A (KITS) IMPLANT
NS IRRIG 1000ML POUR BTL (IV SOLUTION) ×3 IMPLANT
PACK TOTAL KNEE CUSTOM (KITS) ×3 IMPLANT
PROTECTOR NERVE ULNAR (MISCELLANEOUS) ×3 IMPLANT
SET HNDPC FAN SPRY TIP SCT (DISPOSABLE) ×1 IMPLANT
SET PAD KNEE POSITIONER (MISCELLANEOUS) ×3 IMPLANT
SPONGE SURGIFOAM ABS GEL 100 (HEMOSTASIS) ×3 IMPLANT
SPONGE T-LAP 18X18 ~~LOC~~+RFID (SPONGE) IMPLANT
STOCKINETTE 6  STRL (DRAPES) ×2
STOCKINETTE 6 STRL (DRAPES) ×1 IMPLANT
SUT MNCRL AB 3-0 PS2 18 (SUTURE) ×3 IMPLANT
SUT STRATAFIX 1PDS 45CM VIOLET (SUTURE) ×2 IMPLANT
SUT VIC AB 1 CT1 27 (SUTURE) ×6
SUT VIC AB 1 CT1 27XBRD ANTBC (SUTURE) ×3 IMPLANT
SUT VIC AB 2-0 CT1 27 (SUTURE) ×4
SUT VIC AB 2-0 CT1 TAPERPNT 27 (SUTURE) ×2 IMPLANT
SUT VLOC 180 0 24IN GS25 (SUTURE) ×3 IMPLANT
SYR 50ML LL SCALE MARK (SYRINGE) ×3 IMPLANT
TAPE STRIPS DRAPE STRL (GAUZE/BANDAGES/DRESSINGS) ×3 IMPLANT
TIBIAL BASE ROT PLAT SZ 8 KNEE (Knees) ×3 IMPLANT
TRAY CATH INTERMITTENT SS 16FR (CATHETERS) ×3 IMPLANT
WATER STERILE IRR 1000ML POUR (IV SOLUTION) ×6 IMPLANT
WRAP KNEE MAXI GEL POST OP (GAUZE/BANDAGES/DRESSINGS) ×2 IMPLANT

## 2021-03-28 NOTE — Anesthesia Procedure Notes (Signed)
Spinal  Patient location during procedure: OR Start time: 03/28/2021 8:26 AM End time: 03/28/2021 8:30 AM Reason for block: surgical anesthesia Staffing Performed: resident/CRNA  Resident/CRNA: Claudia Desanctis, CRNA Preanesthetic Checklist Completed: patient identified, IV checked, site marked, risks and benefits discussed, surgical consent, monitors and equipment checked, pre-op evaluation and timeout performed Spinal Block Patient position: sitting Prep: DuraPrep Patient monitoring: heart rate, cardiac monitor, continuous pulse ox and blood pressure Approach: midline Location: L3-4 Injection technique: single-shot Needle Needle type: Pencan  Needle gauge: 24 G Needle length: 10 cm Needle insertion depth: 9 cm Assessment Sensory level: T4 Events: CSF return

## 2021-03-28 NOTE — Anesthesia Procedure Notes (Addendum)
°  Anesthesia Regional Block: Femoral nerve block   Pre-Anesthetic Checklist: , timeout performed,  Correct Patient, Correct Site, Correct Laterality,  Correct Procedure, Correct Position, site marked,  Risks and benefits discussed,  Surgical consent,  Pre-op evaluation,  At surgeon's request and post-op pain management  Laterality: Right  Prep: chloraprep       Needles:  Injection technique: Single-shot  Needle Type: Echogenic Stimulator Needle     Needle Length: 10cm  Needle Gauge: 21     Additional Needles:   Procedures:, nerve stimulator,,, ultrasound used (permanent image in chart),,     Nerve Stimulator or Paresthesia:  Response: quadraceps contraction, 0.45 mA  Additional Responses:   Narrative:  Start time: 03/28/2021 8:00 AM End time: 03/28/2021 8:05 AM Injection made incrementally with aspirations every 5 mL.  Performed by: Personally  Anesthesiologist: Merlinda Frederick, MD  Additional Notes: Functioning IV was confirmed and monitors were applied.   Sterile prep and drape,hand hygiene and sterile gloves were used. Ultrasound guidance: relevant anatomy identified, needle position confirmed, local anesthetic spread visualized around nerve(s)., vascular puncture avoided.  Image printed for medical record. Negative aspiration and negative test dose prior to incremental administration of local anesthetic. The patient tolerated the procedure well.

## 2021-03-28 NOTE — Op Note (Signed)
DATE OF SURGERY:  03/28/2021  TIME: 10:23 AM  PATIENT NAME:  Micheal Silva    AGE: 55 y.o.   PRE-OPERATIVE DIAGNOSIS:  Right knee osteoarthritis  POST-OPERATIVE DIAGNOSIS:  Right knee osteoarthritis  PROCEDURE:  Procedure(s): TOTAL KNEE ARTHROPLASTY  SURGEON:  Brisha Mccabe ANDREW  ASSISTANT:  Leeanne Haus, PA-C, present and scrubbed throughout the case, critical for assistance with exposure, retraction, instrumentation, and closure.  OPERATIVE IMPLANTS: Depuy PFC Attune Rotating Platform.  Femur size 7, Tibia size 8, Patella size 41 3-peg oval button, with a 8 mm polyethylene insert.   PREOPERATIVE INDICATIONS:   Micheal Silva is a 55 y.o. year old male with end stage bone on bone arthritis of the knee who failed conservative treatment and elected for Total Knee Arthroplasty.   The risks, benefits, and alternatives were discussed at length including but not limited to the risks of infection, bleeding, nerve injury, stiffness, blood clots, the need for revision surgery, cardiopulmonary complications, among others, and they were willing to proceed.  OPERATIVE DESCRIPTION:  The patient was brought to the operative room and placed in a supine position.  Spinal anesthesia was administered.  IV antibiotics were given.  The lower extremity was prepped and draped in the usual sterile fashion.  Time out was performed.  The leg was elevated and exsanguinated and the tourniquet was inflated.  Anterior quadriceps tendon splitting approach was performed.  The patella was retracted and osteophytes were removed.  The anterior horn of the medial and lateral meniscus was removed and cruciate ligaments resected.   The distal femur was opened with the drill and the intramedullary distal femoral cutting jig was utilized, set at 5 degrees resecting 10 mm off the distal femur.  Care was taken to protect the collateral ligaments.  The distal femoral sizing jig was applied, taking care to avoid  notching.  Then the 4-in-1 cutting jig was applied and the anterior and posterior femur was cut, along with the chamfer cuts.    Then the extramedullary tibial cutting jig was utilized making the appropriate cut using the anterior tibial crest as a reference building in appropriate posterior slope.  Care was taken during the cut to protect the medial and collateral ligaments.  The proximal tibia was removed along with the posterior horns of the menisci.   The posterior medial femoral osteophytes and posterior lateral femoral osteophytes were removed.    The flexion gap was then measured and was symmetric with the extension gap, measured at 8.  I completed the distal femoral preparation using the appropriate jig to prepare the box.  The patella was then measured, and cut with the saw.    The proximal tibia sized and prepared accordingly with the reamer and the punch, and then all components were trialed with the trial insert.  The knee was found to have excellent balance and full motion.    The above named components were then cemented into place and all excess cement was removed.  The trial polyethylene component was in place during cementation, and then was exchanged for the real polyethylene component.    The knee was easily taken through a range of motion and the patella tracked well and the knee irrigated copiously and the parapatellar and subcutaneous tissue closed with vicryl, and monocryl with steri strips for the skin.  The arthrotomy was closed at 90 of flexion. The wounds were dressed with sterile gauze and the tourniquet released and the patient was awakened and returned to the PACU in  stable and satisfactory condition.  There were no complications.  Total tourniquet time was 80 minutes.

## 2021-03-28 NOTE — Anesthesia Postprocedure Evaluation (Signed)
Anesthesia Post Note  Patient: Micheal Silva  Procedure(s) Performed: TOTAL KNEE ARTHROPLASTY (Right: Knee)     Patient location during evaluation: PACU Anesthesia Type: Regional and Spinal Level of consciousness: oriented and awake and alert Pain management: pain level controlled Vital Signs Assessment: post-procedure vital signs reviewed and stable Respiratory status: spontaneous breathing and respiratory function stable Cardiovascular status: blood pressure returned to baseline and stable Postop Assessment: no headache, no backache, no apparent nausea or vomiting and patient able to bend at knees Anesthetic complications: no   No notable events documented.  Last Vitals:  Vitals:   03/28/21 1215 03/28/21 1235  BP: 129/77 136/80  Pulse: 73 69  Resp: 11 16  Temp: 36.5 C 36.7 C  SpO2: 100% 96%    Last Pain:  Vitals:   03/28/21 1416  TempSrc:   PainSc: 8                  [name]

## 2021-03-28 NOTE — Transfer of Care (Signed)
Immediate Anesthesia Transfer of Care Note  Patient: Micheal Silva  Procedure(s) Performed: TOTAL KNEE ARTHROPLASTY (Right: Knee)  Patient Location: PACU  Anesthesia Type:Spinal  Level of Consciousness: drowsy and patient cooperative  Airway & Oxygen Therapy: Patient Spontanous Breathing and Patient connected to face mask  Post-op Assessment: Report given to RN and Post -op Vital signs reviewed and stable  Post vital signs: Reviewed and stable  Last Vitals:  Vitals Value Taken Time  BP 124/84 03/28/21 1056  Temp    Pulse 78 03/28/21 1058  Resp 18 03/28/21 1058  SpO2 100 % 03/28/21 1058  Vitals shown include unvalidated device data.  Last Pain:  Vitals:   03/28/21 0711  TempSrc: Oral  PainSc:       Patients Stated Pain Goal: 5 (33/17/40 9927)  Complications: No notable events documented.

## 2021-03-28 NOTE — Evaluation (Signed)
Physical Therapy Evaluation Patient Details Name: Micheal Silva MRN: [id number] DOB: Mar 10, 1966 Today's Date: 03/28/2021  History of Present Illness  55yo male s/p R TKA, PMH: opiate abuse, OA, DM, HTN, depression, obesity, sleep apnea  Clinical Impression    Pt is s/p TKA resulting in the deficits listed below (see PT Problem List).  Pt amb in room ~15' with Rw and min assist. Limited by nausea and mild dizziness.   Pt will benefit from skilled PT to increase their independence and safety with mobility to allow discharge to the venue listed below.       Recommendations for follow up therapy are one component of a multi-disciplinary discharge planning process, led by the attending physician.  Recommendations may be updated based on patient status, additional functional criteria and insurance authorization.  Follow Up Recommendations Follow physician's recommendations for discharge plan and follow up therapies    Assistance Recommended at Discharge Intermittent Supervision/Assistance  Functional Status Assessment Patient has had a recent decline in their functional status and demonstrates the ability to make significant improvements in function in a reasonable and predictable amount of time.  Equipment Recommendations  Other (comment) (wife can acquire RW)    Recommendations for Other Services       Precautions / Restrictions Precautions Precautions: Fall;Knee Restrictions Weight Bearing Restrictions: No RLE Weight Bearing: Weight bearing as tolerated      Mobility  Bed Mobility               General bed mobility comments: to EOB with NT    Transfers Overall transfer level: Needs assistance Equipment used: Rolling walker (2 wheels) Transfers: Sit to/from Stand Sit to Stand: Min assist           General transfer comment: cues for hand placement, and RLE position    Ambulation/Gait Ambulation/Gait assistance: Min assist Gait Distance (Feet): 15  Feet Assistive device: Rolling walker (2 wheels) Gait Pattern/deviations: Step-to pattern;Decreased stance time - right;Decreased weight shift to right       General Gait Details: cues for sequence adn RW position  Stairs            Wheelchair Mobility    Modified Rankin (Stroke Patients Only)       Balance                                             Pertinent Vitals/Pain Pain Assessment: 0-10 Pain Score: 6  Pain Location: right knee Pain Descriptors / Indicators: Aching;Grimacing;Sore Pain Intervention(s): Premedicated before session;Monitored during session;Limited activity within patient's tolerance;Repositioned;Ice applied    Home Living Family/patient expects to be discharged to:: Private residence Living Arrangements: Spouse/significant other;Children Available Help at Discharge: Family Type of Home: House Home Access: Stairs to enter Entrance Stairs-Rails: None Technical brewer of Steps: 2+1   Home Layout: One level Home Equipment: BSC/3in1 Additional Comments: wife states they can acquire walker if needed    Prior Function Prior Level of Function : Independent/Modified Independent                     Hand Dominance        Extremity/Trunk Assessment   Upper Extremity Assessment Upper Extremity Assessment: Overall WFL for tasks assessed    Lower Extremity Assessment Lower Extremity Assessment: RLE deficits/detail RLE Deficits / Details: ankle WFL, knee extension and hip flexion 2+/5 RLE: Unable to  fully assess due to pain       Communication   Communication: No difficulties  Cognition Arousal/Alertness: Awake/alert Behavior During Therapy: WFL for tasks assessed/performed Overall Cognitive Status: Within Functional Limits for tasks assessed                                          General Comments      Exercises Total Joint Exercises Ankle Circles/Pumps: AROM;10 reps;Both Quad Sets:  AROM;Both;5 reps;Limitations Quad Sets Limitations: pain   Assessment/Plan    PT Assessment Patient needs continued PT services  PT Problem List Decreased strength;Decreased mobility;Decreased range of motion;Decreased activity tolerance;Decreased balance;Pain;Decreased knowledge of use of DME       PT Treatment Interventions DME instruction;Therapeutic activities;Gait training;Functional mobility training;Therapeutic exercise;Patient/family education;Stair training    PT Goals (Current goals can be found in the Care Plan section)  Acute Rehab PT Goals Patient Stated Goal: home PT Goal Formulation: With patient Time For Goal Achievement: 04/04/21 Potential to Achieve Goals: Good    Frequency 7X/week   Barriers to discharge        Co-evaluation               AM-PAC PT "6 Clicks" Mobility  Outcome Measure Help needed turning from your back to your side while in a flat bed without using bedrails?: A Little   Help needed moving to and from a bed to a chair (including a wheelchair)?: A Little Help needed standing up from a chair using your arms (e.g., wheelchair or bedside chair)?: A Little Help needed to walk in hospital room?: A Little Help needed climbing 3-5 steps with a railing? : A Lot 6 Click Score: 14    End of Session Equipment Utilized During Treatment: Gait belt Activity Tolerance: Patient tolerated treatment well Patient left: in chair;with call bell/phone within reach;with chair alarm set;with family/visitor present Nurse Communication: Mobility status PT Visit Diagnosis: Other abnormalities of gait and mobility (R26.89);Difficulty in walking, not elsewhere classified (R26.2)    Time: 6295-2841 PT Time Calculation (min) (ACUTE ONLY): 38 min   Charges:   PT Evaluation $PT Eval Low Complexity: 1 Low PT Treatments $Gait Training: 8-22 mins $Therapeutic Activity: 8-22 mins        Baxter Flattery, PT  Acute Rehab Dept ([place]) [phone] Pager  [phone]  03/28/2021   [facility] 03/28/2021, 3:36 PM

## 2021-03-28 NOTE — Anesthesia Procedure Notes (Signed)
Procedure Name: MAC Date/Time: 03/28/2021 8:35 AM Performed by: Claudia Desanctis, CRNA Pre-anesthesia Checklist: Patient identified, Emergency Drugs available, Suction available and Patient being monitored Patient Re-evaluated:Patient Re-evaluated prior to induction Oxygen Delivery Method: Simple face mask

## 2021-03-28 NOTE — Interval H&P Note (Signed)
History and Physical Interval Note:  03/28/2021 8:22 AM  Micheal Silva  has presented today for surgery, with the diagnosis of Right knee osteoarthritis.  The various methods of treatment have been discussed with the patient and family. After consideration of risks, benefits and other options for treatment, the patient has consented to  Procedure(s) with comments: TOTAL KNEE ARTHROPLASTY (Right) - with adductor canal block 120 as a surgical intervention.  The patient's history has been reviewed, patient examined, no change in status, stable for surgery.  I have reviewed the patient's chart and labs.  Questions were answered to the patient's satisfaction.     Jaber Dunlow ANDREW

## 2021-03-29 ENCOUNTER — Encounter (HOSPITAL_COMMUNITY): Payer: Self-pay | Admitting: Specialist

## 2021-03-29 DIAGNOSIS — M1711 Unilateral primary osteoarthritis, right knee: Secondary | ICD-10-CM | POA: Diagnosis not present

## 2021-03-29 MED ORDER — METHOCARBAMOL 500 MG PO TABS
500.0000 mg | ORAL_TABLET | Freq: Four times a day (QID) | ORAL | 0 refills | Status: DC | PRN
Start: 2021-03-29 — End: 2022-02-13

## 2021-03-29 MED ORDER — ONDANSETRON HCL 4 MG PO TABS
4.0000 mg | ORAL_TABLET | Freq: Four times a day (QID) | ORAL | 0 refills | Status: DC | PRN
Start: 2021-03-29 — End: 2021-05-21

## 2021-03-29 MED ORDER — GABAPENTIN 300 MG PO CAPS: 300.0000 mg | ORAL_CAPSULE | Freq: Every day | ORAL | 0 refills | Status: DC

## 2021-03-29 MED ORDER — TRAMADOL HCL 50 MG PO TABS: 50.0000 mg | ORAL_TABLET | Freq: Four times a day (QID) | ORAL | 1 refills | Status: DC | PRN

## 2021-03-29 MED ORDER — OXYCODONE HCL 5 MG PO TABS
5.0000 mg | ORAL_TABLET | ORAL | 0 refills | Status: DC | PRN
Start: 2021-03-29 — End: 2021-05-21

## 2021-03-29 MED ORDER — BISACODYL 5 MG PO TBEC: 5.0000 mg | DELAYED_RELEASE_TABLET | Freq: Every day | ORAL | 0 refills | Status: DC | PRN

## 2021-03-29 NOTE — Progress Notes (Signed)
Subjective: 1 Day Post-Op Procedure(s) (LRB): TOTAL KNEE ARTHROPLASTY (Right) Patient reports pain as mild.  Reports the pain was okay overnight, starting to increase this am Was able to ambulate yesterday with PT No events overnight  Objective: Vital signs in last 24 hours: Temp:  [97.6 F (36.4 C)-99.3 F (37.4 C)] 99.3 F (37.4 C) (12/22 0525) Pulse Rate:  [62-90] 62 (12/22 0525) Resp:  [11-21] 18 (12/22 0525) BP: (112-147)/(58-100) 128/71 (12/22 0525) SpO2:  [96 %-100 %] 98 % (12/22 0525) Weight:  [91.2 kg] 91.2 kg (12/21 0646)  Intake/Output from previous day: 12/21 0701 - 12/22 0700 In: 3220.8 [P.O.:600; I.V.:2220.8; IV Piggyback:400] Out: 3000 [Urine:2900; Blood:100] Intake/Output this shift: Total I/O In: 997.9 [P.O.:360; I.V.:537.9; IV Piggyback:100] Out: 800 [Urine:800]  Recent Labs    03/28/21 1307  HGB 15.1   Recent Labs    03/28/21 1307  WBC 8.4  RBC 4.97  HCT 43.8  PLT 232   Recent Labs    03/28/21 1307  CREATININE 0.95   No results for input(s): LABPT, INR in the last 72 hours.  Neurologically intact Neurovascular intact Sensation intact distally Intact pulses distally Dorsiflexion/Plantar flexion intact Incision: dressing C/D/I Compartment soft   Assessment/Plan: 1 Day Post-Op Procedure(s) (LRB): TOTAL KNEE ARTHROPLASTY (Right) Advance diet Up with therapy, WBAT Discharge home with family, OPPT already set up Patient is okay to be d/c home today once cleared by PT Meds have been sent to pharmacy DVT ppx: Lovenox in house, ASA 81 mg BID starting tomorrow once home GI ppx He will follow up in the office 2 weeks from now    Patient's anticipated LOS is less than 2 midnights, meeting these requirements: - Younger than 25 - Lives within 1 hour of care - Has a competent adult at home to recover with post-op recover - NO history of  - Chronic pain requiring opiods  - Diabetes  - Coronary Artery Disease  - Heart failure  - Heart  attack  - Stroke  - DVT/VTE  - Cardiac arrhythmia  - Respiratory Failure/COPD  - Renal failure  - Anemia  - Advanced Liver disease     Micheal Silva [phone] 03/29/2021, 6:26 AM

## 2021-03-29 NOTE — Discharge Summary (Signed)
Physician Discharge Summary  Patient ID: Micheal Silva MRN: [id number] DOB/AGE: April 12, 1965 55 y.o.  Admit date: 03/28/2021 Discharge date: 03/29/2021  Admission Diagnoses: Right knee osteoarthritis  Discharge Diagnoses:  Principal Problem:   Osteoarthritis of right knee   Discharged Condition: good  Hospital Course: Patient was admitted on December 21 for a right total knee arthroplasty due to end-stage right knee osteoarthritis.  Patient tolerated the surgery well.  He was sent to PACU in the postop floor in stable condition.  He had to spend the night due to sleep apnea and he does not wear his mask at nighttime.  He was seen by physical therapy.  He was able to get up and ambulate about 15 feet with walker.  No events overnight.  Postop day 1 patient doing well, pain is controlled with p.o. pain medication.  He was able to get up with therapy, no complaints.  He was discharged home in stable condition.  Patient was sent home with pain medication, nausea medication, muscle relaxer.  He will follow-up in the office in 2 weeks.  He already has outpatient physical therapy scheduled.  Consults: None  Significant Diagnostic Studies: none  Treatments: IV hydration, antibiotics: Ancef, analgesia: acetaminophen, Dilaudid, and oxycodone, gabapentin, anticoagulation: Lovenox, therapies: PT, and surgery: right TKA  Discharge Exam: Blood pressure 128/71, pulse 62, temperature 99.3 F (37.4 C), temperature source Oral, resp. rate 18, height 5\' 8"  (1.727 m), weight 91.2 kg, SpO2 98 %. General appearance: alert, cooperative, appears stated age, and no distress Extremities: extremities normal, atraumatic, no cyanosis or edema, edema right lower extremity, and Homans sign is negative, no sign of DVT Pulses: 2+ and symmetric Skin: Skin color, texture, turgor normal. No rashes or lesions Neurologic: Grossly normal, weakness in right lower extremity consistent with a right TKA Incision/Wound: Dressings  C/D/I  Disposition: Discharge disposition: 01-Home or Self Care       Discharge Instructions     Call MD / Call 911   Complete by: As directed    If you experience chest pain or shortness of breath, CALL 911 and be transported to the hospital emergency room.  If you develope a fever above 101 F, pus (white drainage) or increased drainage or redness at the wound, or calf pain, call your surgeon's office.   Constipation Prevention   Complete by: As directed    Drink plenty of fluids.  Prune juice may be helpful.  You may use a stool softener, such as Colace (over the counter) 100 mg twice a day.  Use MiraLax (over the counter) for constipation as needed.   Diet - low sodium heart healthy   Complete by: As directed    Discharge instructions   Complete by: As directed    Dr. Sydnee Cabal [facility] [address] [place], [place] [address] [phone]  TOTAL KNEE REPLACEMENT POSTOPERATIVE DIRECTIONS  Knee Rehabilitation, Guidelines Following Surgery  Results after knee surgery are often greatly improved when you follow the exercise, range of motion and muscle strengthening exercises prescribed by your doctor. Safety measures are also important to protect the knee from further injury. Any time any of these exercises cause you to have increased pain or swelling in your knee joint, decrease the amount until you are comfortable again and slowly increase them. If you have problems or questions, call your caregiver or physical therapist for advice.   HOME CARE INSTRUCTIONS  Remove items at home which could result in a fall. This includes throw rugs  or furniture in walking pathways.  ICE to the affected knee every three hours for 30 minutes at a time and then as needed for pain and swelling.  Continue to use ice on the knee for pain and swelling from surgery. You may notice swelling that will progress down to the foot and ankle.  This is normal after surgery.  Elevate the leg  when you are not up walking on it.   Continue to use the breathing machine which will help keep your temperature down.  It is common for your temperature to cycle up and down following surgery, especially at night when you are not up moving around and exerting yourself.  The breathing machine keeps your lungs expanded and your temperature down. Do not place pillow under knee, focus on keeping the knee straight while resting   DIET You may resume your previous home diet once your are discharged from the hospital.  DRESSING / WOUND CARE / SHOWERING Keep the surgical dressing until follow up.  The dressing is water proof, but you need to put extra covering over it like plastic wrap.  IF THE DRESSING FALLS OFF or the wound gets wet inside, change the dressing with sterile gauze.  Please use good hand washing techniques before changing the dressing.  Do not use any lotions or creams on the incision until instructed by your surgeon.   You may start showering once you are discharged home but do not submerge the incision under water.  You are sent home with an ACE bandage on over the leg, this can be removed at 3 days from surgery. At this time you can start showering. Please place the white TED stocking on the surgical leg after. This needs to be worn on the surgical leg for 2 weeks after surgery.   ACTIVITY Walk with your walker as instructed. Use walker as long as suggested by your caregivers. Avoid periods of inactivity such as sitting longer than an hour when not asleep. This helps prevent blood clots.  You may resume a sexual relationship in one month or when given the OK by your doctor.  You may return to work once you are cleared by your doctor.  Do not drive a car for 6 weeks or until released by you surgeon.  Do not drive while taking narcotics.  WEIGHT BEARING Weight bearing as tolerated with assist device (walker, cane, etc) as directed, use it as long as suggested by your surgeon or  therapist, typically at least 4-6 weeks.  POSTOPERATIVE CONSTIPATION PROTOCOL Constipation - defined medically as fewer than three stools per week and severe constipation as less than one stool per week.  One of the most common issues patients have following surgery is constipation.  Even if you have a regular bowel pattern at home, your normal regimen is likely to be disrupted due to multiple reasons following surgery.  Combination of anesthesia, postoperative narcotics, change in appetite and fluid intake all can affect your bowels.  In order to avoid complications following surgery, here are some recommendations in order to help you during your recovery period.  Colace (docusate) - Pick up an over-the-counter form of Colace or another stool softener and take twice a day as long as you are requiring postoperative pain medications.  Take with a full glass of water daily.  If you experience loose stools or diarrhea, hold the colace until you stool forms back up.  If your symptoms do not get better within 1 week or if  they get worse, check with your doctor.  Dulcolax (bisacodyl) - Pick up over-the-counter and take as directed by the product packaging as needed to assist with the movement of your bowels.  Take with a full glass of water.  Use this product as needed if not relieved by Colace only.   MiraLax (polyethylene glycol) - Pick up over-the-counter to have on hand.  MiraLax is a solution that will increase the amount of water in your bowels to assist with bowel movements.  Take as directed and can mix with a glass of water, juice, soda, coffee, or tea.  Take if you go more than two days without a movement. Do not use MiraLax more than once per day. Call your doctor if you are still constipated or irregular after using this medication for 7 days in a row.  If you continue to have problems with postoperative constipation, please contact the office for further assistance and recommendations.  If you  experience "the worst abdominal pain ever" or develop nausea or vomiting, please contact the office immediatly for further recommendations for treatment.  ITCHING  If you experience itching with your medications, try taking only a single pain pill, or even half a pain pill at a time.  You can also use Benadryl over the counter for itching or also to help with sleep.   TED HOSE STOCKINGS Wear the elastic stockings on both legs for two weeks following surgery during the day but you may remove then at night for sleeping.  Okay to remove ACE in 3 days, put TED on after  MEDICATIONS See your medication summary on the "After Visit Summary" that the nursing staff will review with you prior to discharge.  You may have some home medications which will be placed on hold until you complete the course of blood thinner medication.  It is important for you to complete the blood thinner medication as prescribed by your surgeon.  Continue your approved medications as instructed at time of discharge. If you were not previously taking any blood thinners prior to surgery please start taking Aspirin 325 mg tabs twice daily for 6 weeks. If you are unable to take Aspirin please let your doctor know.   PRECAUTIONS If you experience chest pain or shortness of breath - call 911 immediately for transfer to the hospital emergency department.  If you develop a fever greater that 101 F, purulent drainage from wound, increased redness or drainage from wound, foul odor from the wound/dressing, or calf pain - CONTACT YOUR SURGEON.                                                   FOLLOW-UP APPOINTMENTS Make sure you keep all of your appointments after your operation with your surgeon and caregivers. You should call the office at the above phone number and make an appointment for approximately two weeks after the date of your surgery or on the date instructed by your surgeon outlined in the "After Visit Summary".   RANGE OF  MOTION AND STRENGTHENING EXERCISES  Rehabilitation of the knee is important following a knee injury or an operation. After just a few days of immobilization, the muscles of the thigh which control the knee become weakened and shrink (atrophy). Knee exercises are designed to build up the tone and strength of the thigh muscles and  to improve knee motion. Often times heat used for twenty to thirty minutes before working out will loosen up your tissues and help with improving the range of motion but do not use heat for the first two weeks following surgery. These exercises can be done on a training (exercise) mat, on the floor, on a table or on a bed. Use what ever works the best and is most comfortable for you Knee exercises include:  Leg Lifts - While your knee is still immobilized in a splint or cast, you can do straight leg raises. Lift the leg to 60 degrees, hold for 3 sec, and slowly lower the leg. Repeat 10-20 times 2-3 times daily. Perform this exercise against resistance later as your knee gets better.  Quad and Hamstring Sets - Tighten up the muscle on the front of the thigh (Quad) and hold for 5-10 sec. Repeat this 10-20 times hourly. Hamstring sets are done by pushing the foot backward against an object and holding for 5-10 sec. Repeat as with quad sets.  Leg Slides: Lying on your back, slowly slide your foot toward your buttocks, bending your knee up off the floor (only go as far as is comfortable). Then slowly slide your foot back down until your leg is flat on the floor again. Angel Wings: Lying on your back spread your legs to the side as far apart as you can without causing discomfort.  A rehabilitation program following serious knee injuries can speed recovery and prevent re-injury in the future due to weakened muscles. Contact your doctor or a physical therapist for more information on knee rehabilitation.   IF YOU ARE TRANSFERRED TO A SKILLED REHAB FACILITY If the patient is transferred to a  skilled rehab facility following release from the hospital, a list of the current medications will be sent to the facility for the patient to continue.  When discharged from the skilled rehab facility, please have the facility set up the patient's [place]ona prior to being released. Also, the skilled facility will be responsible for providing the patient with their medications at time of release from the facility to include their pain medication, the muscle relaxants, and their blood thinner medication. If the patient is still at the rehab facility at time of the two week follow up appointment, the skilled rehab facility will also need to assist the patient in arranging follow up appointment in our office and any transportation needs.  MAKE SURE YOU:  Understand these instructions.  Get help right away if you are not doing well or get worse.    Pick up stool softner and laxative for home use following surgery while on pain medications. May shower starting three days after surgery. Please use a clean towel to pat the leg dry following showers. Continue to use ice for pain and swelling after surgery. Do not use any lotions or creams on the incision until instructed by you Start Aspirin immediately following surgery.   Do not put a pillow under the knee. Place it under the heel.   Complete by: As directed    Driving restrictions   Complete by: As directed    No driving for two weeks   Post-operative opioid taper instructions:   Complete by: As directed    POST-OPERATIVE OPIOID TAPER INSTRUCTIONS: It is important to wean off of your opioid medication as soon as possible. If you do not need pain medication after your surgery it is ok to stop day one. Opioids include:  Codeine, Hydrocodone(Norco, Vicodin), Oxycodone(Percocet, oxycontin) and hydromorphone amongst others.  Long term and even short term use of opiods can cause: Increased pain  response Dependence Constipation Depression Respiratory depression And more.  Withdrawal symptoms can include Flu like symptoms Nausea, vomiting And more Techniques to manage these symptoms Hydrate well Eat regular healthy meals Stay active Use relaxation techniques(deep breathing, meditating, yoga) Do Not substitute Alcohol to help with tapering If you have been on opioids for less than two weeks and do not have pain than it is ok to stop all together.  Plan to wean off of opioids This plan should start within one week post op of your joint replacement. Maintain the same interval or time between taking each dose and first decrease the dose.  Cut the total daily intake of opioids by one tablet each day Next start to increase the time between doses. The last dose that should be eliminated is the evening dose.      TED hose   Complete by: As directed    Use stockings (TED hose) for three weeks on both leg(s).  You may remove them at night for sleeping.   Walker standard   Complete by: As directed    Weight bearing as tolerated   Complete by: As directed       Allergies as of 03/29/2021       Reactions   Ativan [lorazepam] Other (See Comments)   Increased agitation   Crestor [rosuvastatin Calcium]    Muscle aches   Vicodin [hydrocodone-acetaminophen] Nausea And Vomiting   Atorvastatin    Other reaction(s): Leg cramp        Medication List     TAKE these medications    bisacodyl 5 MG EC tablet Commonly known as: DULCOLAX Take 1 tablet (5 mg total) by mouth daily as needed for moderate constipation.   buPROPion 150 MG 24 hr tablet Commonly known as: WELLBUTRIN XL Take 450 mg by mouth daily.   cloNIDine 0.1 MG tablet Commonly known as: CATAPRES Take 0.1 mg by mouth 2 (two) times daily.   gabapentin 300 MG capsule Commonly known as: NEURONTIN Take 1 capsule (300 mg total) by mouth at bedtime.   meloxicam 15 MG tablet Commonly known as: MOBIC Take 15 mg  by mouth daily as needed for pain.   methocarbamol 500 MG tablet Commonly known as: ROBAXIN Take 1 tablet (500 mg total) by mouth every 6 (six) hours as needed for muscle spasms.   multivitamin tablet Take 1 tablet by mouth daily. Doterra - MitoMax   ondansetron 4 MG tablet Commonly known as: ZOFRAN Take 1 tablet (4 mg total) by mouth every 6 (six) hours as needed for nausea.   oxyCODONE 5 MG immediate release tablet Commonly known as: Oxy IR/ROXICODONE Take 1-2 tablets (5-10 mg total) by mouth every 4 (four) hours as needed for moderate pain or severe pain (pain score 4-6).   Suboxone 8-2 MG Film Generic drug: Buprenorphine HCl-Naloxone HCl Place 1 Film under the tongue in the morning and at bedtime.   testosterone cypionate 100 MG/ML injection Commonly known as: DEPOTESTOTERONE CYPIONATE Inject 50 mg into the muscle every 7 (seven) days. For IM use only   traMADol 50 MG tablet Commonly known as: ULTRAM Take 1 tablet (50 mg total) by mouth every 6 (six) hours as needed for moderate pain.               Discharge Care Instructions  (From admission, onward)  Start     Ordered   03/29/21 0000  Weight bearing as tolerated        03/29/21 0636             Signed: Nettie Elm [facility] [phone] 03/29/2021, 6:37 AM

## 2021-03-29 NOTE — TOC Transition Note (Signed)
Transition of Care [facility]) - CM/SW Discharge Note  Patient Details  Name: Micheal Silva MRN: [id number] Date of Birth: Feb 21, 1966  [facility] CM/SW Contact:  Sherie Don, LCSW Phone Number: 03/29/2021, 1:27 PM  Clinical Narrative: Patient is expected to discharge home after working with PT. CSW met with patient to review discharge plan and needs. Patient will discharge home with OPPT at [facility]. Patient has elevated toilets at home, but will need a rolling walker. CSW made DME referral to [employer]. [employer] delivered walker to patient's room. TOC signing off.  Final next level of care: OP Rehab Barriers to Discharge: No Barriers Identified  Patient Goals and CMS Choice Patient states their goals for this hospitalization and ongoing recovery are:: Discharge home with OPPT at [facility] CMS Medicare.gov Compare Post Acute Care list provided to:: Patient Choice offered to / list presented to : Patient  Discharge Plan and Services        DME Arranged: Walker rolling DME Agency: [employer] Date DME Agency Contacted: 03/29/21 Representative spoke with at DME Agency: Genevieve/Danielle  Readmission Risk Interventions No flowsheet data found.

## 2021-03-29 NOTE — Progress Notes (Signed)
Physical Therapy Treatment Patient Details Name: Micheal Silva MRN: [id number] DOB: 1965-09-22 Today's Date: 03/29/2021   History of Present Illness 55yo male s/p R TKA, PMH: opiate abuse, OA, DM, HTN, depression, obesity, sleep apnea    PT Comments    POD # 1 am session General Comments: AxO x 3 very motivated.  Works at [employer] and plans to take 3 months off. Pt was already OOB in recliner.  General transfer comment: <25% cues for hand placement, and RLE position.  Good safety cognition. Also assisted with a toilet transfer.  General Gait Details: <25% cues for sequence and RW position.  Tolerated an increased distance. Then returned to room to perform some TE's following HEP handout.  Instructed on proper tech, freq as well as use of ICE.   Pt will need another PT session to practice stairs and complete HEP TE's.    Recommendations for follow up therapy are one component of a multi-disciplinary discharge planning process, led by the attending physician.  Recommendations may be updated based on patient status, additional functional criteria and insurance authorization.  Follow Up Recommendations  Outpatient PT     Assistance Recommended at Discharge Intermittent Supervision/Assistance  Equipment Recommendations  Rolling walker (2 wheels)    Recommendations for Other Services       Precautions / Restrictions Precautions Precautions: Fall;Knee Precaution Comments: instructed no pillow under knee Restrictions Weight Bearing Restrictions: No RLE Weight Bearing: Weight bearing as tolerated     Mobility  Bed Mobility               General bed mobility comments: Pt OOB in recliner    Transfers Overall transfer level: Needs assistance Equipment used: Rolling walker (2 wheels) Transfers: Sit to/from Stand Sit to Stand: Supervision           General transfer comment: <25% cues for hand placement, and RLE position.  Good safety cognition.     Ambulation/Gait Ambulation/Gait assistance: Supervision;Min guard Gait Distance (Feet): 75 Feet Assistive device: Rolling walker (2 wheels) Gait Pattern/deviations: Step-to pattern;Decreased stance time - right;Decreased weight shift to right Gait velocity: decreased     General Gait Details: <25% cues for sequence and RW position.  Tolerated an increased distance.   Stairs             Wheelchair Mobility    Modified Rankin (Stroke Patients Only)       Balance                                            Cognition Arousal/Alertness: Awake/alert Behavior During Therapy: WFL for tasks assessed/performed Overall Cognitive Status: Within Functional Limits for tasks assessed                                 General Comments: AxO x 3 very motivated.  Works at [employer] and plans to take 3 months off.        Exercises      General Comments        Pertinent Vitals/Pain Pain Assessment: 0-10 Pain Score: 5  Pain Location: right knee Pain Descriptors / Indicators: Aching;Grimacing;Sore Pain Intervention(s): Monitored during session;Premedicated before session;Repositioned;Ice applied    Home Living  Prior Function            PT Goals (current goals can now be found in the care plan section) Progress towards PT goals: Progressing toward goals    Frequency    7X/week      PT Plan Current plan remains appropriate    Co-evaluation              AM-PAC PT "6 Clicks" Mobility   Outcome Measure  Help needed turning from your back to your side while in a flat bed without using bedrails?: A Little Help needed moving from lying on your back to sitting on the side of a flat bed without using bedrails?: A Little Help needed moving to and from a bed to a chair (including a wheelchair)?: A Little Help needed standing up from a chair using your arms (e.g., wheelchair or bedside  chair)?: A Little Help needed to walk in hospital room?: A Little Help needed climbing 3-5 steps with a railing? : A Little 6 Click Score: 18    End of Session Equipment Utilized During Treatment: Gait belt Activity Tolerance: Patient tolerated treatment well Patient left: in chair;with call bell/phone within reach;with chair alarm set;with family/visitor present Nurse Communication: Mobility status PT Visit Diagnosis: Other abnormalities of gait and mobility (R26.89);Difficulty in walking, not elsewhere classified (R26.2)     Time: 0920-1000 PT Time Calculation (min) (ACUTE ONLY): 40 min  Charges:  $Gait Training: 8-22 mins $Therapeutic Exercise: 8-22 mins $Therapeutic Activity: 8-22 mins                    Rica Koyanagi  PTA Acute  Rehabilitation Services Pager      [phone] Office      [phone]

## 2021-03-29 NOTE — Progress Notes (Signed)
Physical Therapy Treatment Patient Details Name: Micheal Silva MRN: [id number] DOB: 1965-09-26 Today's Date: 03/29/2021   History of Present Illness 55yo male s/p R TKA, PMH: opiate abuse, OA, DM, HTN, depression, obesity, sleep apnea    PT Comments    POD # 1 pm session Assisted with amb an increased distance, practriced stairs Then returned to room to perform some TE's following HEP handout.  Instructed on proper tech, freq as well as use of ICE.   Addressed all mobility questions, discussed appropriate activity, educated on use of ICE.  Pt ready for D/C to home.   Recommendations for follow up therapy are one component of a multi-disciplinary discharge planning process, led by the attending physician.  Recommendations may be updated based on patient status, additional functional criteria and insurance authorization.  Follow Up Recommendations  Outpatient PT     Assistance Recommended at Discharge Intermittent Supervision/Assistance  Equipment Recommendations  Rolling walker (2 wheels)    Recommendations for Other Services       Precautions / Restrictions Precautions Precautions: Fall;Knee Precaution Comments: instructed no pillow under knee Restrictions Weight Bearing Restrictions: No RLE Weight Bearing: Weight bearing as tolerated     Mobility  Bed Mobility               General bed mobility comments: Pt OOB in recliner    Transfers Overall transfer level: Needs assistance Equipment used: Rolling walker (2 wheels) Transfers: Sit to/from Stand Sit to Stand: Supervision           General transfer comment: <25% cues for hand placement, and RLE position.  Good safety cognition.    Ambulation/Gait Ambulation/Gait assistance: Supervision;Min guard Gait Distance (Feet): 95 Feet Assistive device: Rolling walker (2 wheels) Gait Pattern/deviations: Step-to pattern;Decreased stance time - right;Decreased weight shift to right Gait velocity: decreased      General Gait Details: <25% cues for sequence and RW position.  Tolerated an increased distance.   Stairs Stairs: Yes Stairs assistance: Supervision;Min guard Stair Management: No rails;Step to pattern;Forwards;With walker Number of Stairs: 4 General stair comments: 50% VC's on proper walker placement and sequencing.  Practiced 2 steps twice.  Pt did well.   Wheelchair Mobility    Modified Rankin (Stroke Patients Only)       Balance                                            Cognition Arousal/Alertness: Awake/alert Behavior During Therapy: WFL for tasks assessed/performed Overall Cognitive Status: Within Functional Limits for tasks assessed                                 General Comments: AxO x 3 very motivated.  Works at [employer] and plans to take 3 months off.        Exercises      General Comments        Pertinent Vitals/Pain Pain Assessment: 0-10 Pain Score: 5  Pain Location: right knee Pain Descriptors / Indicators: Aching;Grimacing;Sore Pain Intervention(s): Monitored during session;Premedicated before session;Repositioned;Ice applied    Home Living                          Prior Function            PT Goals (current  goals can now be found in the care plan section) Progress towards PT goals: Progressing toward goals    Frequency    7X/week      PT Plan Current plan remains appropriate    Co-evaluation              AM-PAC PT "6 Clicks" Mobility   Outcome Measure  Help needed turning from your back to your side while in a flat bed without using bedrails?: A Little Help needed moving from lying on your back to sitting on the side of a flat bed without using bedrails?: A Little Help needed moving to and from a bed to a chair (including a wheelchair)?: A Little Help needed standing up from a chair using your arms (e.g., wheelchair or bedside chair)?: A Little Help needed to walk in  hospital room?: A Little Help needed climbing 3-5 steps with a railing? : A Little 6 Click Score: 18    End of Session Equipment Utilized During Treatment: Gait belt Activity Tolerance: Patient tolerated treatment well Patient left: in chair;with call bell/phone within reach;with chair alarm set;with family/visitor present Nurse Communication: Mobility status PT Visit Diagnosis: Other abnormalities of gait and mobility (R26.89);Difficulty in walking, not elsewhere classified (R26.2)     Time: 1135-1200 PT Time Calculation (min) (ACUTE ONLY): 25 min  Charges:  $Gait Training: 8-22 mins $Therapeutic Exercise: 8-22 mins                     Rica Koyanagi  PTA Acute  Rehabilitation Services Pager      [phone] Office      [phone]

## 2021-05-16 ENCOUNTER — Encounter (HOSPITAL_COMMUNITY): Payer: Self-pay | Admitting: Specialist

## 2021-05-16 ENCOUNTER — Inpatient Hospital Stay (HOSPITAL_COMMUNITY)
Admission: AD | Admit: 2021-05-16 | Discharge: 2021-05-21 | DRG: 464 | Disposition: A | Discharge status: Home Health | Payer: BC Managed Care – PPO | Source: Ambulatory Visit | Attending: Orthopedic Surgery | Admitting: Orthopedic Surgery

## 2021-05-16 ENCOUNTER — Emergency Department (HOSPITAL_COMMUNITY)
Admission: EM | Admit: 2021-05-16 | Discharge: 2021-05-16 | Disposition: A | Discharge status: Home / Self Care | Payer: BC Managed Care – PPO | Source: Home / Self Care | Attending: Emergency Medicine | Admitting: Emergency Medicine

## 2021-05-16 ENCOUNTER — Inpatient Hospital Stay (HOSPITAL_COMMUNITY): Payer: BC Managed Care – PPO

## 2021-05-16 ENCOUNTER — Other Ambulatory Visit: Payer: Self-pay

## 2021-05-16 ENCOUNTER — Inpatient Hospital Stay (HOSPITAL_COMMUNITY)
Admission: AD | Admit: 2021-05-16 | Payer: BC Managed Care – PPO | Source: Home / Self Care | Admitting: Orthopedic Surgery

## 2021-05-16 ENCOUNTER — Encounter (HOSPITAL_COMMUNITY): Payer: Self-pay | Admitting: Emergency Medicine

## 2021-05-16 DIAGNOSIS — T8149XA Infection following a procedure, other surgical site, initial encounter: Secondary | ICD-10-CM | POA: Diagnosis present

## 2021-05-16 DIAGNOSIS — D75839 Thrombocytosis, unspecified: Secondary | ICD-10-CM | POA: Diagnosis present

## 2021-05-16 DIAGNOSIS — E119 Type 2 diabetes mellitus without complications: Secondary | ICD-10-CM | POA: Diagnosis present

## 2021-05-16 DIAGNOSIS — T8141XA Infection following a procedure, superficial incisional surgical site, initial encounter: Secondary | ICD-10-CM | POA: Diagnosis present

## 2021-05-16 DIAGNOSIS — Z7982 Long term (current) use of aspirin: Secondary | ICD-10-CM

## 2021-05-16 DIAGNOSIS — Z79890 Hormone replacement therapy (postmenopausal): Secondary | ICD-10-CM

## 2021-05-16 DIAGNOSIS — Z20822 Contact with and (suspected) exposure to covid-19: Secondary | ICD-10-CM | POA: Insufficient documentation

## 2021-05-16 DIAGNOSIS — M19041 Primary osteoarthritis, right hand: Secondary | ICD-10-CM | POA: Diagnosis present

## 2021-05-16 DIAGNOSIS — E291 Testicular hypofunction: Secondary | ICD-10-CM | POA: Diagnosis present

## 2021-05-16 DIAGNOSIS — Y831 Surgical operation with implant of artificial internal device as the cause of abnormal reaction of the patient, or of later complication, without mention of misadventure at the time of the procedure: Secondary | ICD-10-CM | POA: Diagnosis present

## 2021-05-16 DIAGNOSIS — Z803 Family history of malignant neoplasm of breast: Secondary | ICD-10-CM | POA: Diagnosis not present

## 2021-05-16 DIAGNOSIS — D649 Anemia, unspecified: Secondary | ICD-10-CM | POA: Diagnosis present

## 2021-05-16 DIAGNOSIS — E441 Mild protein-calorie malnutrition: Secondary | ICD-10-CM | POA: Diagnosis present

## 2021-05-16 DIAGNOSIS — T8451XD Infection and inflammatory reaction due to internal right hip prosthesis, subsequent encounter: Secondary | ICD-10-CM | POA: Diagnosis not present

## 2021-05-16 DIAGNOSIS — Z96651 Presence of right artificial knee joint: Secondary | ICD-10-CM | POA: Insufficient documentation

## 2021-05-16 DIAGNOSIS — Z888 Allergy status to other drugs, medicaments and biological substances status: Secondary | ICD-10-CM

## 2021-05-16 DIAGNOSIS — F419 Anxiety disorder, unspecified: Secondary | ICD-10-CM | POA: Diagnosis present

## 2021-05-16 DIAGNOSIS — Z79899 Other long term (current) drug therapy: Secondary | ICD-10-CM

## 2021-05-16 DIAGNOSIS — Z6830 Body mass index (BMI) 30.0-30.9, adult: Secondary | ICD-10-CM | POA: Diagnosis not present

## 2021-05-16 DIAGNOSIS — G4730 Sleep apnea, unspecified: Secondary | ICD-10-CM | POA: Diagnosis present

## 2021-05-16 DIAGNOSIS — M19042 Primary osteoarthritis, left hand: Secondary | ICD-10-CM | POA: Diagnosis present

## 2021-05-16 DIAGNOSIS — I10 Essential (primary) hypertension: Secondary | ICD-10-CM | POA: Diagnosis present

## 2021-05-16 DIAGNOSIS — T8453XA Infection and inflammatory reaction due to internal right knee prosthesis, initial encounter: Secondary | ICD-10-CM | POA: Insufficient documentation

## 2021-05-16 DIAGNOSIS — Z806 Family history of leukemia: Secondary | ICD-10-CM | POA: Diagnosis not present

## 2021-05-16 DIAGNOSIS — E669 Obesity, unspecified: Secondary | ICD-10-CM | POA: Diagnosis present

## 2021-05-16 DIAGNOSIS — F32A Depression, unspecified: Secondary | ICD-10-CM | POA: Diagnosis present

## 2021-05-16 DIAGNOSIS — E785 Hyperlipidemia, unspecified: Secondary | ICD-10-CM | POA: Diagnosis present

## 2021-05-16 DIAGNOSIS — L089 Local infection of the skin and subcutaneous tissue, unspecified: Secondary | ICD-10-CM | POA: Diagnosis present

## 2021-05-16 DIAGNOSIS — D696 Thrombocytopenia, unspecified: Secondary | ICD-10-CM | POA: Diagnosis present

## 2021-05-16 DIAGNOSIS — M009 Pyogenic arthritis, unspecified: Secondary | ICD-10-CM

## 2021-05-16 DIAGNOSIS — N400 Enlarged prostate without lower urinary tract symptoms: Secondary | ICD-10-CM | POA: Diagnosis present

## 2021-05-16 DIAGNOSIS — Z8042 Family history of malignant neoplasm of prostate: Secondary | ICD-10-CM | POA: Diagnosis not present

## 2021-05-16 DIAGNOSIS — E1169 Type 2 diabetes mellitus with other specified complication: Secondary | ICD-10-CM | POA: Diagnosis not present

## 2021-05-16 DIAGNOSIS — Z886 Allergy status to analgesic agent status: Secondary | ICD-10-CM

## 2021-05-16 LAB — TYPE AND SCREEN
ABO/RH(D): A POS
Antibody Screen: NEGATIVE

## 2021-05-16 LAB — BASIC METABOLIC PANEL
Anion gap: 8 (ref 5–15)
BUN: 10 mg/dL (ref 6–20)
CO2: 27 mmol/L (ref 22–32)
Calcium: 8.6 mg/dL — ABNORMAL LOW (ref 8.9–10.3)
Chloride: 94 mmol/L — ABNORMAL LOW (ref 98–111)
Creatinine, Ser: 0.82 mg/dL (ref 0.61–1.24)
GFR, Estimated: >60 mL/min (ref >60)
Glucose, Bld: 327 mg/dL — ABNORMAL HIGH (ref 70–99)
Potassium: 3.5 mmol/L (ref 3.5–5.1)
Sodium: 129 mmol/L — ABNORMAL LOW (ref 135–145)

## 2021-05-16 LAB — SURGICAL PCR SCREEN
MRSA, PCR: NEGATIVE
Staphylococcus aureus: NEGATIVE

## 2021-05-16 LAB — CBC WITH DIFFERENTIAL/PLATELET
Abs Immature Granulocytes: 0.03 10*3/uL (ref 0.00–0.07)
Basophils Absolute: 0 10*3/uL (ref 0.0–0.1)
Basophils Relative: 0 %
Eosinophils Absolute: 0.1 10*3/uL (ref 0.0–0.5)
Eosinophils Relative: 1 %
HCT: 35 % — ABNORMAL LOW (ref 39.0–52.0)
Hemoglobin: 11.7 g/dL — ABNORMAL LOW (ref 13.0–17.0)
Immature Granulocytes: 0 %
Lymphocytes Relative: 10 %
Lymphs Abs: 0.8 10*3/uL (ref 0.7–4.0)
MCH: 29.2 pg (ref 26.0–34.0)
MCHC: 33.4 g/dL (ref 30.0–36.0)
MCV: 87.3 fL (ref 80.0–100.0)
Monocytes Absolute: 0.9 10*3/uL (ref 0.1–1.0)
Monocytes Relative: 10 %
Neutro Abs: 6.7 10*3/uL (ref 1.7–7.7)
Neutrophils Relative %: 79 %
Platelets: 357 10*3/uL (ref 150–400)
RBC: 4.01 MIL/uL — ABNORMAL LOW (ref 4.22–5.81)
RDW: 14.1 % (ref 11.5–15.5)
WBC: 8.5 10*3/uL (ref 4.0–10.5)
nRBC: 0 % (ref 0.0–0.2)

## 2021-05-16 LAB — COMPREHENSIVE METABOLIC PANEL
ALT: 22 U/L (ref 0–44)
AST: 22 U/L (ref 15–41)
Albumin: 3.3 g/dL — ABNORMAL LOW (ref 3.5–5.0)
Alkaline Phosphatase: 90 U/L (ref 38–126)
Anion gap: 10 (ref 5–15)
BUN: 10 mg/dL (ref 6–20)
CO2: 26 mmol/L (ref 22–32)
Calcium: 8.8 mg/dL — ABNORMAL LOW (ref 8.9–10.3)
Chloride: 94 mmol/L — ABNORMAL LOW (ref 98–111)
Creatinine, Ser: 0.92 mg/dL (ref 0.61–1.24)
GFR, Estimated: >60 mL/min (ref >60)
Glucose, Bld: 350 mg/dL — ABNORMAL HIGH (ref 70–99)
Potassium: 3.2 mmol/L — ABNORMAL LOW (ref 3.5–5.1)
Sodium: 130 mmol/L — ABNORMAL LOW (ref 135–145)
Total Bilirubin: 0.4 mg/dL (ref 0.3–1.2)
Total Protein: 7.4 g/dL (ref 6.5–8.1)

## 2021-05-16 LAB — PROTIME-INR
INR: 1.2 (ref 0.8–1.2)
Prothrombin Time: 14.8 seconds (ref 11.4–15.2)

## 2021-05-16 LAB — HEMOGLOBIN A1C
Hgb A1c MFr Bld: 6.9 % — ABNORMAL HIGH (ref 4.8–5.6)
Mean Plasma Glucose: 151.33 mg/dL

## 2021-05-16 LAB — RESP PANEL BY RT-PCR (FLU A&B, COVID) ARPGX2
Influenza A by PCR: NEGATIVE
Influenza B by PCR: NEGATIVE
SARS Coronavirus 2 by RT PCR: NEGATIVE

## 2021-05-16 LAB — C-REACTIVE PROTEIN: CRP: 25.7 mg/dL — ABNORMAL HIGH (ref <1.0)

## 2021-05-16 LAB — GLUCOSE, CAPILLARY
Glucose-Capillary: 338 mg/dL — ABNORMAL HIGH (ref 70–99)
Glucose-Capillary: 250 mg/dL — ABNORMAL HIGH (ref 70–99)

## 2021-05-16 LAB — SEDIMENTATION RATE: Sed Rate: 98 mm/hr — ABNORMAL HIGH (ref 0–16)

## 2021-05-16 MED ORDER — INSULIN ASPART 100 UNIT/ML IJ SOLN
0.0000 [IU] | Freq: Every day | INTRAMUSCULAR | Status: DC
  Administered 2021-05-16: 2 [IU] via SUBCUTANEOUS
  Administered 2021-05-18: 3 [IU] via SUBCUTANEOUS

## 2021-05-16 MED ORDER — ACETAMINOPHEN 325 MG PO TABS
325.0000 mg | ORAL_TABLET | Freq: Four times a day (QID) | ORAL | Status: DC | PRN
  Administered 2021-05-18 – 2021-05-20 (×8): 650 mg via ORAL
  Filled 2021-05-16 (×8): qty 2

## 2021-05-16 MED ORDER — ONDANSETRON HCL 4 MG PO TABS: 4.0000 mg | ORAL_TABLET | Freq: Four times a day (QID) | ORAL | Status: DC | PRN

## 2021-05-16 MED ORDER — METHOCARBAMOL 1000 MG/10ML IJ SOLN
500.0000 mg | Freq: Four times a day (QID) | INTRAVENOUS | Status: DC | PRN
  Filled 2021-05-16: qty 5

## 2021-05-16 MED ORDER — BUPROPION HCL ER (XL) 300 MG PO TB24
300.0000 mg | ORAL_TABLET | Freq: Every day | ORAL | Status: DC
  Administered 2021-05-17 – 2021-05-21 (×5): 300 mg via ORAL
  Filled 2021-05-16 (×5): qty 1

## 2021-05-16 MED ORDER — DIPHENHYDRAMINE HCL 12.5 MG/5ML PO ELIX: 12.5000 mg | ORAL_SOLUTION | ORAL | Status: DC | PRN

## 2021-05-16 MED ORDER — INSULIN ASPART 100 UNIT/ML IJ SOLN
0.0000 [IU] | Freq: Three times a day (TID) | INTRAMUSCULAR | Status: DC
  Administered 2021-05-16: 7 [IU] via SUBCUTANEOUS
  Administered 2021-05-17: 3 [IU] via SUBCUTANEOUS
  Administered 2021-05-17: 2 [IU] via SUBCUTANEOUS
  Administered 2021-05-18 (×3): 5 [IU] via SUBCUTANEOUS
  Administered 2021-05-19: 3 [IU] via SUBCUTANEOUS

## 2021-05-16 MED ORDER — OXYCODONE HCL 5 MG PO TABS
10.0000 mg | ORAL_TABLET | ORAL | Status: DC | PRN
  Administered 2021-05-16 – 2021-05-17 (×2): 10 mg via ORAL
  Administered 2021-05-17: 5 mg via ORAL
  Administered 2021-05-17: 10 mg via ORAL
  Administered 2021-05-18 (×5): 15 mg via ORAL
  Administered 2021-05-18 – 2021-05-19 (×4): 10 mg via ORAL
  Administered 2021-05-20: 15 mg via ORAL
  Administered 2021-05-20: 10 mg via ORAL
  Administered 2021-05-20 (×2): 15 mg via ORAL
  Administered 2021-05-20: 10 mg via ORAL
  Administered 2021-05-21 (×2): 15 mg via ORAL
  Filled 2021-05-16 (×2): qty 2
  Filled 2021-05-16: qty 3
  Filled 2021-05-16: qty 2
  Filled 2021-05-16 (×4): qty 3
  Filled 2021-05-16: qty 2
  Filled 2021-05-16 (×3): qty 3
  Filled 2021-05-16: qty 2
  Filled 2021-05-16 (×3): qty 3
  Filled 2021-05-16: qty 2

## 2021-05-16 MED ORDER — SENNA 8.6 MG PO TABS
1.0000 | ORAL_TABLET | Freq: Two times a day (BID) | ORAL | Status: DC
  Administered 2021-05-16 – 2021-05-21 (×8): 8.6 mg via ORAL
  Filled 2021-05-16 (×10): qty 1

## 2021-05-16 MED ORDER — BUPRENORPHINE HCL-NALOXONE HCL 8-2 MG SL SUBL
1.0000 | SUBLINGUAL_TABLET | Freq: Every day | SUBLINGUAL | Status: DC
  Administered 2021-05-16 – 2021-05-21 (×2): 1 via SUBLINGUAL
  Filled 2021-05-16 (×4): qty 1

## 2021-05-16 MED ORDER — CLONIDINE HCL 0.1 MG PO TABS
0.1000 mg | ORAL_TABLET | Freq: Two times a day (BID) | ORAL | Status: DC
  Administered 2021-05-16 – 2021-05-21 (×10): 0.1 mg via ORAL
  Filled 2021-05-16 (×10): qty 1

## 2021-05-16 MED ORDER — DOCUSATE SODIUM 100 MG PO CAPS
100.0000 mg | ORAL_CAPSULE | Freq: Two times a day (BID) | ORAL | Status: DC
  Administered 2021-05-16 – 2021-05-21 (×9): 100 mg via ORAL
  Filled 2021-05-16 (×10): qty 1

## 2021-05-16 MED ORDER — SODIUM CHLORIDE 0.9 % IV SOLN: INTRAVENOUS | Status: DC

## 2021-05-16 MED ORDER — METHOCARBAMOL 500 MG PO TABS
500.0000 mg | ORAL_TABLET | Freq: Four times a day (QID) | ORAL | Status: DC | PRN
  Administered 2021-05-16 – 2021-05-20 (×10): 500 mg via ORAL
  Filled 2021-05-16 (×8): qty 1

## 2021-05-16 MED ORDER — POLYETHYLENE GLYCOL 3350 17 G PO PACK: 17.0000 g | PACK | Freq: Every day | ORAL | Status: DC | PRN

## 2021-05-16 MED ORDER — OXYMETAZOLINE HCL 0.05 % NA SOLN
1.0000 | Freq: Two times a day (BID) | NASAL | Status: AC
  Administered 2021-05-16 – 2021-05-18 (×4): 1 via NASAL
  Filled 2021-05-16: qty 15

## 2021-05-16 MED ORDER — HYDROMORPHONE HCL 1 MG/ML IJ SOLN
0.5000 mg | INTRAMUSCULAR | Status: DC | PRN
  Administered 2021-05-16 – 2021-05-20 (×8): 1 mg via INTRAVENOUS
  Filled 2021-05-16 (×8): qty 1

## 2021-05-16 MED ORDER — BUPROPION HCL ER (XL) 150 MG PO TB24: 150.0000 mg | ORAL_TABLET | ORAL | Status: DC

## 2021-05-16 MED ORDER — BUPROPION HCL ER (XL) 150 MG PO TB24
150.0000 mg | ORAL_TABLET | Freq: Every day | ORAL | Status: DC
  Administered 2021-05-16 – 2021-05-20 (×5): 150 mg via ORAL
  Filled 2021-05-16 (×5): qty 1

## 2021-05-16 MED ORDER — FLEET ENEMA 7-19 GM/118ML RE ENEM: 1.0000 | ENEMA | Freq: Once | RECTAL | Status: DC | PRN

## 2021-05-16 MED ORDER — GABAPENTIN 300 MG PO CAPS
300.0000 mg | ORAL_CAPSULE | Freq: Every day | ORAL | Status: DC
  Administered 2021-05-16 – 2021-05-20 (×5): 300 mg via ORAL
  Filled 2021-05-16 (×5): qty 1

## 2021-05-16 MED ORDER — ONDANSETRON HCL 4 MG/2ML IJ SOLN: 4.0000 mg | Freq: Four times a day (QID) | INTRAMUSCULAR | Status: DC | PRN

## 2021-05-16 MED ORDER — MUPIROCIN 2 % EX OINT: 1.0000 "application " | TOPICAL_OINTMENT | Freq: Two times a day (BID) | CUTANEOUS | Status: DC

## 2021-05-16 MED ORDER — SORBITOL 70 % SOLN
30.0000 mL | Freq: Every day | Status: DC | PRN
  Filled 2021-05-16: qty 30

## 2021-05-16 MED ORDER — OXYCODONE HCL 5 MG PO TABS
5.0000 mg | ORAL_TABLET | ORAL | Status: DC | PRN
  Administered 2021-05-19: 10 mg via ORAL
  Filled 2021-05-16 (×4): qty 2

## 2021-05-16 NOTE — ED Triage Notes (Signed)
Reports a R knee replacement 7 weeks ago, started to swell, appears to have an abscess and cellulites, erythema and edemea of knee since Sunday. Was referred by sx, states he is to be admitted and have sx again today.

## 2021-05-16 NOTE — Progress Notes (Signed)
Inpatient Diabetes Program Recommendations  AACE/ADA: New Consensus Statement on Inpatient Glycemic Control (2015)  Target Ranges:  Prepandial:   less than 140 mg/dL      Peak postprandial:   less than 180 mg/dL (1-2 hours)      Critically ill patients:  140 - 180 mg/dL   Lab Results  Component Value Date   GLUCAP 204 (H) 03/28/2021    Review of Glycemic Control  Diabetes history: DM2 Outpatient Diabetes medications: None Current orders for Inpatient glycemic control: Novolog 0-9 units TID with meals and 0-5 HS  CBGs 327, 350 mg/dL (Pt states he drank a Cheer-wine prior to coming to ED)  Last HgbA1C - 6.1% on 06/21/20 To be NPO after MN  Inpatient Diabetes Program Recommendations:    Agree with orders.  Change Novolog to 0-9 units Q4H when NPO.  HgbA1C to assess glycemic control prior to admission  Spoke with pt at bedside regarding his diabetes. States he takes no meds for diabetes at home, has a glucose meter although states he hasn't checked blood sugars "in a long time." Pt said he drank a Cheer-wine prior to coming to ED and thus reason for hyperglycemia. Admits to drinking regular soda on occasion. Has not been able to get a lot of exercise since knee surgery almost 7 weeks ago. Discussed importance of f/u with PCP for diabetes management. Answered questions. Discussed importance of healthy diet with portion control and reducing sugary beverages.   Will f/u on 2/9.  Thank you. Lorenda Peck, RD, LDN, CDE Inpatient Diabetes Coordinator [phone]

## 2021-05-16 NOTE — ED Provider Notes (Signed)
Micheal Silva   CSN: [id number] Arrival date & time: 05/16/21  1113     History  Chief Complaint  Patient presents with   Post-op Problem    Micheal Silva is a 56 y.o. male.  HPI Patient is a 56 year old gentleman had total knee replacement December 21 with Dr. Theda Sers of [facility].  Seems that he has done well in recovery however over the past 4 days he has developed redness swelling and pain he was placed on Keflex however continued to have worsening of his symptoms.  He began taking Keflex 3 days ago in the evening.  Denies any chest pain difficulty breathing cough congestion lightheadedness or dizziness.     Home Medications Prior to Admission medications   Medication Sig Start Date End Date Taking? Authorizing Provider  acetaminophen (TYLENOL) 500 MG tablet Take 1,000 mg by mouth every 6 (six) hours as needed (pain).    [provider]  bisacodyl (DULCOLAX) 5 MG EC tablet Take 1 tablet (5 mg total) by mouth daily as needed for moderate constipation. Patient not taking: Reported on 05/16/2021 03/29/21   Drue Novel, PA  buPROPion (WELLBUTRIN XL) 150 MG 24 hr tablet Take 150-300 mg by mouth See admin instructions. Take 2 tablets (300 mg) by mouth in the morning & take 1 tablet (150 mg) by mouth in the evening. 09/09/16   [provider]  cloNIDine (CATAPRES) 0.1 MG tablet Take 0.1 mg by mouth 2 (two) times daily. 12/09/16   [provider]  gabapentin (NEURONTIN) 300 MG capsule Take 1 capsule (300 mg total) by mouth at bedtime. Patient not taking: Reported on 05/16/2021 03/29/21 04/28/21  Elizabeth Sauer R, PA  ibuprofen (ADVIL) 200 MG tablet Take 600 mg by mouth every 6 (six) hours as needed (for pain.).    [provider]  methocarbamol (ROBAXIN) 500 MG tablet Take 1 tablet (500 mg total) by mouth every 6 (six) hours as needed for muscle spasms. 03/29/21   Drue Novel, PA  Multiple Vitamin  (MULTIVITAMIN) tablet Take 1 tablet by mouth in the morning. Doterra - MitoMax    [provider]  ondansetron (ZOFRAN) 4 MG tablet Take 1 tablet (4 mg total) by mouth every 6 (six) hours as needed for nausea. Patient not taking: Reported on 05/16/2021 03/29/21   Elizabeth Sauer R, PA  oxyCODONE (OXY IR/ROXICODONE) 5 MG immediate release tablet Take 1-2 tablets (5-10 mg total) by mouth every 4 (four) hours as needed for moderate pain or severe pain (pain score 4-6). Patient not taking: Reported on 05/16/2021 03/29/21   Elizabeth Sauer R, PA  SUBOXONE 8-2 MG FILM Place 1 Film under the tongue in the morning and at bedtime. 01/13/17   [provider]  testosterone cypionate (DEPOTESTOSTERONE CYPIONATE) 200 MG/ML injection Inject 200 mg into the muscle every 14 (fourteen) days. 03/07/21   [provider]  traMADol (ULTRAM) 50 MG tablet Take 1 tablet (50 mg total) by mouth every 6 (six) hours as needed for moderate pain. Patient not taking: Reported on 05/16/2021 03/29/21   Drue Novel, PA      Allergies    Ativan [lorazepam], Crestor [rosuvastatin calcium], Vicodin [hydrocodone-acetaminophen], and Atorvastatin    Review of Systems   Review of Systems  Physical Exam Updated Vital Signs BP (!) 156/84 (BP Location: Right Arm)    Pulse (!) 101    Temp 98.6 F (37 C) (Oral)    Resp 18  Ht 5\' 8"  (1.727 m)    Wt 92 kg    SpO2 97%    BMI 30.84 kg/m  Physical Exam Vitals and nursing Silva reviewed.  Constitutional:      General: He is not in acute distress. HENT:     Head: Normocephalic and atraumatic.     Nose: Nose normal.  Eyes:     General: No scleral icterus. Cardiovascular:     Rate and Rhythm: Normal rate and regular rhythm.     Pulses: Normal pulses.     Heart sounds: Normal heart sounds.  Pulmonary:     Effort: Pulmonary effort is normal. No respiratory distress.     Breath sounds: No wheezing.  Abdominal:     Palpations: Abdomen is soft.     Tenderness:  There is no abdominal tenderness.  Musculoskeletal:     Cervical back: Normal range of motion.     Right lower leg: No edema.     Left lower leg: No edema.     Comments: Redness and swelling of right knee distal pulses palpated and intact  Skin:    General: Skin is warm and dry.     Capillary Refill: Capillary refill takes less than 2 seconds.  Neurological:     Mental Status: He is alert. Mental status is at baseline.  Psychiatric:        Mood and Affect: Mood normal.        Behavior: Behavior normal.        ED Results / Procedures / Treatments   Labs (all labs ordered are listed, but only abnormal results are displayed) Labs Reviewed  CBC WITH DIFFERENTIAL/PLATELET - Abnormal; Notable for the following components:      Result Value   RBC 4.01 (*)    Hemoglobin 11.7 (*)    HCT 35.0 (*)    All other components within normal limits  BASIC METABOLIC PANEL - Abnormal; Notable for the following components:   Sodium 129 (*)    Chloride 94 (*)    Glucose, Bld 327 (*)    Calcium 8.6 (*)    All other components within normal limits  RESP PANEL BY RT-PCR (FLU A&B, COVID) [id number]    EKG None  Radiology No results found.  Procedures Procedures    Medications Ordered in ED Medications - No data to display  ED Course/ Medical Decision Making/ A&P Clinical Course as of 05/16/21 1310  Wed May 16, 2021  1246 Discussed with Dr. Stann Mainland of orthopedics who will touch base with [facility] and have him call me.  [WF]  8921 Seems patient should have been a direct admit. Will further investigate.  [WF]  1303 Discussed with secretary who talked to flow manager.  Seems that patient should be admitted to room 1335. [WF]    Clinical Course User Index [WF] Tedd Sias, [place]                           Medical Decision Making Amount and/or Complexity of Data Reviewed Labs: ordered.  Risk Decision regarding hospitalization.    This patient presents to the ED for concern of right  knee pain swelling and redness, this involves a number of treatment options, and is a complaint that carries with it a high risk of complications and morbidity.  The differential diagnosis includes septic arthritis, superficial infection, abscess, cellulitis   Co morbidities: Discussed in HPI   Brief History:  Patient with redness  warmth and swelling of his right knee for the past 4 days history of total knee replacement  EMR reviewed including pt PMHx, past surgical history and past visits to ER.   See HPI for more details   Lab Tests:  I ordered and independently interpreted labs.  The pertinent results include:    Labs notable for mild hyponatremia secondary to hyperglycemia.  Grossly normal.  Perhaps may be somewhat dehydrated.  No leukocytosis or significant anemia mildly anemic.  COVID influenza negative.   Imaging Studies:  No imaging studies ordered for this patient    Cardiac Monitoring:  NA NA   Medicines ordered:  No medications ordered   Critical Interventions:  Consultation with orthopedic surgery   Consults:  I requested consultation with Dr. Stann Mainland who recommend admission    Reevaluation:  After the interventions noted above I re-evaluated patient and found that they have :stayed the same   Social Determinants of Health:  The patient's social determinants of health were a factor in the care of this patient    Problem List / ED Course:  Cellulitis versus deeper infection of right knee Patient with artificial knee   Dispostion:  After consideration of the diagnostic results and the patients response to treatment, I feel that the patent would benefit from admission and evaluation by orthopedist     Final Clinical Impression(s) / ED Diagnoses Final diagnoses:  Infection of right knee [facility])    Rx / DC Orders ED Discharge Orders     None         Tedd Sias, [place] 05/16/21 1436    Godfrey Pick, MD 05/19/21 1734

## 2021-05-16 NOTE — H&P (Signed)
Micheal Silva is an 56 y.o. male.   Chief Complaint: Right knee HPI: This is a pleasant 56 year old male who is almost 7 weeks s/p Right TKA with Dr. Theda Sers. He was seen in the office last Friday for his 6 week postop appointment and was doing great. Saturday he started noticing some pain in his knee along with some redness along the incision site. By Saturday night he had significant swelling and redness over the knee. Monday he was at therapy at [facility] [place] and was seen by the PA there and they placed him on Keflex and told him to follow up with he surgeon on Wednesday. He showed up into the office today with a noticeable pustule over the incision site. He states that the redness is improving but the white spot has increased in size. Denies any fevers, chills.   Past Medical History:  Diagnosis Date   Anxiety    Arthritis    HANDS BIL   BPH (benign prostatic hyperplasia)    Depression    Diabetes mellitus ([place])    type 2   Difficult intubation    Heart murmur    HTN (hypertension)    Hyperlipidemia    Hypogonadism in male    Insomnia    Obesity    Right-sided chest wall pain 12/25/2015   Sleep apnea    CPAP    Past Surgical History:  Procedure Laterality Date   APPENDECTOMY  2006   Dr. Tamala Julian   CHOLECYSTECTOMY  2002   [facility]-Dr. Tamala Julian   COLONOSCOPY  01/01/2016   COLONOSCOPY WITH PROPOFOL N/A 01/01/2016   Procedure: COLONOSCOPY WITH PROPOFOL;  Surgeon: [name], MD;  Location: [facility] ENDOSCOPY;  Service: Endoscopy;  Laterality: N/A;   COLONOSCOPY WITH PROPOFOL N/A 02/26/2017   Procedure: COLONOSCOPY WITH PROPOFOL;  Surgeon: [name], MD;  Location: [facility] ENDOSCOPY;  Service: Endoscopy;  Laterality: N/A;   corrective surgery for sleep apnea  2005   ESOPHAGOGASTRODUODENOSCOPY (EGD) WITH PROPOFOL N/A 01/01/2016   Procedure: ESOPHAGOGASTRODUODENOSCOPY (EGD) WITH PROPOFOL;  Surgeon: [name], MD;  Location: [facility] ENDOSCOPY;  Service: Endoscopy;  Laterality:  N/A;   KNEE ARTHROTOMY Right 10/23/2017   Procedure: RIGHT KNEE ARTHROTOMY PARTIAL MEDIAL MENISSECTOMY PATELLOFEMORAL CHONDROPLASTY;  Surgeon: Leim Fabry, MD;  Location: [place];  Service: Orthopedics;  Laterality: Right;  Diabetic - oral meds   LAPAROSCOPIC LYSIS OF ADHESIONS  01/25/2016   Procedure: LAPAROSCOPIC LYSIS OF ADHESIONS;  Surgeon: [name], MD;  Location: [facility] ORS;  Service: General;;   LAPAROSCOPY N/A 01/25/2016   Procedure: LAPAROSCOPY DIAGNOSTIC;  Surgeon: [name], MD;  Location: [facility] ORS;  Service: General;  Laterality: N/A;   TONSILLECTOMY  07/2003   palate also removed   TOTAL KNEE ARTHROPLASTY Right 03/28/2021   Procedure: TOTAL KNEE ARTHROPLASTY;  Surgeon: Sydnee Cabal, MD;  Location: [facility] ORS;  Service: Orthopedics;  Laterality: Right;  with adductor canal block 120    Family History  Problem Relation Age of Onset   Prostate cancer Father    Leukemia Father    Diabetes Mellitus II Unknown    Coronary artery disease Unknown    Hypertension Unknown    Breast cancer Mother    Social History:  reports that he has never smoked. He has never used smokeless tobacco. He reports that he does not currently use drugs. He reports that he does not drink alcohol.  Allergies:  Allergies  Allergen Reactions   Ativan [Lorazepam] Other (See Comments)  Increased agitation   Crestor [Rosuvastatin Calcium]     Muscle aches   Vicodin [Hydrocodone-Acetaminophen] Nausea And Vomiting   Atorvastatin     Other reaction(s): Leg cramp    Medications Prior to Admission  Medication Sig Dispense Refill   buPROPion (WELLBUTRIN XL) 150 MG 24 hr tablet Take 150-300 mg by mouth See admin instructions. Take 2 tablets (300 mg) by mouth in the morning & take 1 tablet (150 mg) by mouth in the evening.     cloNIDine (CATAPRES) 0.1 MG tablet Take 0.1 mg by mouth 2 (two) times daily.  1   methocarbamol (ROBAXIN) 500 MG tablet Take 1 tablet (500 mg total) by  mouth every 6 (six) hours as needed for muscle spasms. 40 tablet 0   acetaminophen (TYLENOL) 500 MG tablet Take 1,000 mg by mouth every 6 (six) hours as needed (pain).     bisacodyl (DULCOLAX) 5 MG EC tablet Take 1 tablet (5 mg total) by mouth daily as needed for moderate constipation. (Patient not taking: Reported on 05/16/2021) 30 tablet 0   gabapentin (NEURONTIN) 300 MG capsule Take 1 capsule (300 mg total) by mouth at bedtime. (Patient not taking: Reported on 05/16/2021) 30 capsule 0   ibuprofen (ADVIL) 200 MG tablet Take 600 mg by mouth every 6 (six) hours as needed (for pain.).     Multiple Vitamin (MULTIVITAMIN) tablet Take 1 tablet by mouth in the morning. Doterra - MitoMax     ondansetron (ZOFRAN) 4 MG tablet Take 1 tablet (4 mg total) by mouth every 6 (six) hours as needed for nausea. (Patient not taking: Reported on 05/16/2021) 40 tablet 0   oxyCODONE (OXY IR/ROXICODONE) 5 MG immediate release tablet Take 1-2 tablets (5-10 mg total) by mouth every 4 (four) hours as needed for moderate pain or severe pain (pain score 4-6). (Patient not taking: Reported on 05/16/2021) 50 tablet 0   SUBOXONE 8-2 MG FILM Place 1 Film under the tongue in the morning and at bedtime.     testosterone cypionate (DEPOTESTOSTERONE CYPIONATE) 200 MG/ML injection Inject 200 mg into the muscle every 14 (fourteen) days.     traMADol (ULTRAM) 50 MG tablet Take 1 tablet (50 mg total) by mouth every 6 (six) hours as needed for moderate pain. (Patient not taking: Reported on 05/16/2021) 40 tablet 1    Results for orders placed or performed during the hospital encounter of 05/16/21 (from the past 48 hour(s))  Resp Panel by RT-PCR (Flu A&B, Covid) Nasopharyngeal Swab     Status: None   Collection Time: 05/16/21 12:14 PM   Specimen: Nasopharyngeal Swab; Nasopharyngeal(NP) swabs in vial transport medium  Result Value Ref Range   SARS Coronavirus 2 by RT PCR NEGATIVE NEGATIVE    Comment: (NOTE) SARS-CoV-2 target nucleic acids are NOT  DETECTED.  The SARS-CoV-2 RNA is generally detectable in upper respiratory specimens during the acute phase of infection. The lowest concentration of SARS-CoV-2 viral copies this assay can detect is 138 copies/mL. A negative result does not preclude SARS-Cov-2 infection and should not be used as the sole basis for treatment or other patient management decisions. A negative result may occur with  improper specimen collection/handling, submission of specimen other than nasopharyngeal swab, presence of viral mutation(s) within the areas targeted by this assay, and inadequate number of viral copies(<138 copies/mL). A negative result must be combined with clinical observations, patient history, and epidemiological information. The expected result is Negative.  Fact Sheet for Patients:  EntrepreneurPulse.com.au  Fact Sheet for Healthcare  Providers:  IncredibleEmployment.be  This test is no t yet approved or cleared by the [place] and  has been authorized for detection and/or diagnosis of SARS-CoV-2 by FDA under an Emergency Use Authorization (EUA). This EUA will remain  in effect (meaning this test can be used) for the duration of the COVID-19 declaration under Section 564(b)(1) of the Act, 21 U.S.C.section 360bbb-3(b)(1), unless the authorization is terminated  or revoked sooner.       Influenza A by PCR NEGATIVE NEGATIVE   Influenza B by PCR NEGATIVE NEGATIVE    Comment: (NOTE) The Xpert Xpress SARS-CoV-2/FLU/RSV plus assay is intended as an aid in the diagnosis of influenza from Nasopharyngeal swab specimens and should not be used as a sole basis for treatment. Nasal washings and aspirates are unacceptable for Xpert Xpress SARS-CoV-2/FLU/RSV testing.  Fact Sheet for Patients: EntrepreneurPulse.com.au  Fact Sheet for Healthcare Providers: IncredibleEmployment.be  This test is not yet approved or  cleared by the [place] FDA and has been authorized for detection and/or diagnosis of SARS-CoV-2 by FDA under an Emergency Use Authorization (EUA). This EUA will remain in effect (meaning this test can be used) for the duration of the COVID-19 declaration under Section 564(b)(1) of the Act, 21 U.S.C. section 360bbb-3(b)(1), unless the authorization is terminated or revoked.  Performed at [facility], [place] [address], [place], [place] [address]   CBC with Differential/Platelet     Status: Abnormal   Collection Time: 05/16/21 12:14 PM  Result Value Ref Range   WBC 8.5 4.0 - 10.5 K/uL   RBC 4.01 (L) 4.22 - 5.81 MIL/uL   Hemoglobin 11.7 (L) 13.0 - 17.0 g/dL   HCT 35.0 (L) 39.0 - 52.0 %   MCV 87.3 80.0 - 100.0 fL   MCH 29.2 26.0 - 34.0 pg   MCHC 33.4 30.0 - 36.0 g/dL   RDW 14.1 11.5 - 15.5 %   Platelets 357 150 - 400 K/uL   nRBC 0.0 0.0 - 0.2 %   Neutrophils Relative % 79 %   Neutro Abs 6.7 1.7 - 7.7 K/uL   Lymphocytes Relative 10 %   Lymphs Abs 0.8 0.7 - 4.0 K/uL   Monocytes Relative 10 %   Monocytes Absolute 0.9 0.1 - 1.0 K/uL   Eosinophils Relative 1 %   Eosinophils Absolute 0.1 0.0 - 0.5 K/uL   Basophils Relative 0 %   Basophils Absolute 0.0 0.0 - 0.1 K/uL   Immature Granulocytes 0 %   Abs Immature Granulocytes 0.03 0.00 - 0.07 K/uL    Comment: Performed at [facility], [place] [address], [place], [place] [address]  Basic metabolic panel     Status: Abnormal   Collection Time: 05/16/21 12:14 PM  Result Value Ref Range   Sodium 129 (L) 135 - 145 mmol/L   Potassium 3.5 3.5 - 5.1 mmol/L   Chloride 94 (L) 98 - 111 mmol/L   CO2 27 22 - 32 mmol/L   Glucose, Bld 327 (H) 70 - 99 mg/dL    Comment: Glucose reference range applies only to samples taken after fasting for at least 8 hours.   BUN 10 6 - 20 mg/dL   Creatinine, Ser 0.82 0.61 - 1.24 mg/dL   Calcium 8.6 (L) 8.9 - 10.3 mg/dL   GFR, Estimated >60 >60 mL/min    Comment:  (NOTE) Calculated using the CKD-EPI Creatinine Equation (2021)    Anion gap 8 5 - 15    Comment: Performed at [facility]  [place] [address], [place], [place] [address]   No results found.  Review of Systems  All other systems reviewed and are negative.  There were no vitals taken for this visit. Physical Exam Musculoskeletal:     Comments: On exam of his right knee white pustule noted over the medial aspect of his incision site. Redness around the entire knee. Papable effusion noted which appears to be extra articular. Calf is supple, NVI in right lower extremity.   Skin:    General: Skin is warm and dry.     Capillary Refill: Capillary refill takes less than 2 seconds.  Psychiatric:        Mood and Affect: Mood normal.        Behavior: Behavior normal.        Thought Content: Thought content normal.        Judgment: Judgment normal.     Assessment/Plan Right knee infection: Patient is Almost 7 weeks status post right total knee arthroplasty.  We sent him to the ER today from the office.  He needs to get admitted.  Dr. Theda Sers spoke with Dr. Lyla Glassing who will be taking over care for him. He is going to be scheduling him for surgery for tomorrow for a right knee I&D and possible poly-exchange.  All this was explained with the patient in the office and then again at the hospital.  All questions encouraged and answered for the patient. Dr. Lyla Glassing will be seeing the patient as well and will be explaining more of the surgical management to him.   Drue Novel, PA 05/16/2021, 2:10 PM

## 2021-05-17 ENCOUNTER — Encounter (HOSPITAL_COMMUNITY): Payer: Self-pay | Admitting: Orthopedic Surgery

## 2021-05-17 ENCOUNTER — Inpatient Hospital Stay (HOSPITAL_COMMUNITY): Payer: BC Managed Care – PPO | Admitting: Certified Registered Nurse Anesthetist

## 2021-05-17 ENCOUNTER — Encounter (HOSPITAL_COMMUNITY)
Admission: AD | Disposition: A | Discharge status: Home Health | Payer: Self-pay | Source: Ambulatory Visit | Attending: Orthopedic Surgery

## 2021-05-17 ENCOUNTER — Other Ambulatory Visit: Payer: Self-pay

## 2021-05-17 HISTORY — PX: I & D KNEE WITH POLY EXCHANGE: SHX5024

## 2021-05-17 LAB — SYNOVIAL CELL COUNT + DIFF, W/ CRYSTALS
Crystals, Fluid: NONE SEEN
Eosinophils-Synovial: 0 % (ref 0–1)
Lymphocytes-Synovial Fld: 1 % (ref 0–20)
Monocyte-Macrophage-Synovial Fluid: 4 % — ABNORMAL LOW (ref 50–90)
Neutrophil, Synovial: 95 % — ABNORMAL HIGH (ref 0–25)
WBC, Synovial: 116150 /mm3 — ABNORMAL HIGH (ref 0–200)

## 2021-05-17 LAB — GLUCOSE, CAPILLARY
Glucose-Capillary: 225 mg/dL — ABNORMAL HIGH (ref 70–99)
Glucose-Capillary: 183 mg/dL — ABNORMAL HIGH (ref 70–99)
Glucose-Capillary: 204 mg/dL — ABNORMAL HIGH (ref 70–99)
Glucose-Capillary: 208 mg/dL — ABNORMAL HIGH (ref 70–99)
Glucose-Capillary: 182 mg/dL — ABNORMAL HIGH (ref 70–99)

## 2021-05-17 LAB — CBC
HCT: 34.6 % — ABNORMAL LOW (ref 39.0–52.0)
Hemoglobin: 11.4 g/dL — ABNORMAL LOW (ref 13.0–17.0)
MCH: 29.2 pg (ref 26.0–34.0)
MCHC: 32.9 g/dL (ref 30.0–36.0)
MCV: 88.7 fL (ref 80.0–100.0)
Platelets: 369 10*3/uL (ref 150–400)
RBC: 3.9 MIL/uL — ABNORMAL LOW (ref 4.22–5.81)
RDW: 14.6 % (ref 11.5–15.5)
WBC: 8 10*3/uL (ref 4.0–10.5)
nRBC: 0 % (ref 0.0–0.2)

## 2021-05-17 SURGERY — IRRIGATION AND DEBRIDEMENT KNEE WITH POLY EXCHANGE
Anesthesia: Spinal | Site: Knee | Laterality: Right

## 2021-05-17 MED ORDER — CHLORHEXIDINE GLUCONATE 4 % EX LIQD
CUTANEOUS | Status: AC
  Filled 2021-05-17: qty 60

## 2021-05-17 MED ORDER — PROPOFOL 1000 MG/100ML IV EMUL
INTRAVENOUS | Status: AC
  Filled 2021-05-17: qty 100

## 2021-05-17 MED ORDER — CEFAZOLIN IN SODIUM CHLORIDE 3-0.9 GM/100ML-% IV SOLN
INTRAVENOUS | Status: AC
  Filled 2021-05-17: qty 100

## 2021-05-17 MED ORDER — METOCLOPRAMIDE HCL 5 MG/ML IJ SOLN: 5.0000 mg | Freq: Three times a day (TID) | INTRAMUSCULAR | Status: DC | PRN

## 2021-05-17 MED ORDER — TOBRAMYCIN SULFATE 1.2 G IJ SOLR
INTRAMUSCULAR | Status: AC
  Filled 2021-05-17: qty 1.2

## 2021-05-17 MED ORDER — TRANEXAMIC ACID-NACL 1000-0.7 MG/100ML-% IV SOLN
1000.0000 mg | INTRAVENOUS | Status: AC
  Administered 2021-05-17: 1000 mg via INTRAVENOUS

## 2021-05-17 MED ORDER — VANCOMYCIN HCL IN DEXTROSE 1-5 GM/200ML-% IV SOLN
INTRAVENOUS | Status: AC
  Filled 2021-05-17: qty 200

## 2021-05-17 MED ORDER — METHOCARBAMOL 500 MG IVPB - SIMPLE MED
500.0000 mg | Freq: Four times a day (QID) | INTRAVENOUS | Status: DC | PRN
  Administered 2021-05-17: 500 mg via INTRAVENOUS
  Filled 2021-05-17: qty 50

## 2021-05-17 MED ORDER — SODIUM CHLORIDE 0.9 % IR SOLN
Status: DC | PRN
  Administered 2021-05-17 (×2): 3000 mL

## 2021-05-17 MED ORDER — BUPIVACAINE IN DEXTROSE 0.75-8.25 % IT SOLN
INTRATHECAL | Status: DC | PRN
Start: 2021-05-17 — End: 2021-05-17
  Administered 2021-05-17: 2 mL via INTRATHECAL

## 2021-05-17 MED ORDER — METHOCARBAMOL 500 MG IVPB - SIMPLE MED
INTRAVENOUS | Status: AC
  Filled 2021-05-17: qty 50

## 2021-05-17 MED ORDER — MENTHOL 3 MG MT LOZG: 1.0000 | LOZENGE | OROMUCOSAL | Status: DC | PRN

## 2021-05-17 MED ORDER — ONDANSETRON HCL 4 MG/2ML IJ SOLN: 4.0000 mg | Freq: Four times a day (QID) | INTRAMUSCULAR | Status: DC | PRN

## 2021-05-17 MED ORDER — FENTANYL CITRATE PF 50 MCG/ML IJ SOSY
50.0000 ug | PREFILLED_SYRINGE | Freq: Once | INTRAMUSCULAR | Status: AC
  Administered 2021-05-17: 50 ug via INTRAVENOUS

## 2021-05-17 MED ORDER — VANCOMYCIN HCL 1750 MG/350ML IV SOLN
1750.0000 mg | INTRAVENOUS | Status: DC
  Administered 2021-05-18 – 2021-05-20 (×3): 1750 mg via INTRAVENOUS
  Filled 2021-05-17 (×3): qty 350

## 2021-05-17 MED ORDER — ONDANSETRON HCL 4 MG PO TABS: 4.0000 mg | ORAL_TABLET | Freq: Four times a day (QID) | ORAL | Status: DC | PRN

## 2021-05-17 MED ORDER — POLYETHYLENE GLYCOL 3350 17 G PO PACK: 17.0000 g | PACK | Freq: Every day | ORAL | Status: DC | PRN

## 2021-05-17 MED ORDER — FENTANYL CITRATE (PF) 100 MCG/2ML IJ SOLN
INTRAMUSCULAR | Status: DC | PRN
Start: 2021-05-17 — End: 2021-05-17
  Administered 2021-05-17: 50 ug via INTRAVENOUS

## 2021-05-17 MED ORDER — VANCOMYCIN HCL 1000 MG IV SOLR
INTRAVENOUS | Status: DC | PRN
  Administered 2021-05-17: 1000 mg

## 2021-05-17 MED ORDER — PROPOFOL 500 MG/50ML IV EMUL
INTRAVENOUS | Status: DC | PRN
  Administered 2021-05-17: 100 ug/kg/min via INTRAVENOUS

## 2021-05-17 MED ORDER — ONDANSETRON HCL 4 MG/2ML IJ SOLN
INTRAMUSCULAR | Status: DC | PRN
Start: 2021-05-17 — End: 2021-05-17
  Administered 2021-05-17: 4 mg via INTRAVENOUS

## 2021-05-17 MED ORDER — LACTATED RINGERS IV SOLN: INTRAVENOUS | Status: DC | PRN

## 2021-05-17 MED ORDER — ACETAMINOPHEN 160 MG/5ML PO SOLN: 325.0000 mg | Freq: Once | ORAL | Status: DC | PRN

## 2021-05-17 MED ORDER — FENTANYL CITRATE (PF) 100 MCG/2ML IJ SOLN
INTRAMUSCULAR | Status: AC
  Filled 2021-05-17: qty 2

## 2021-05-17 MED ORDER — FENTANYL CITRATE PF 50 MCG/ML IJ SOSY
PREFILLED_SYRINGE | INTRAMUSCULAR | Status: AC
  Filled 2021-05-17: qty 2

## 2021-05-17 MED ORDER — PROMETHAZINE HCL 25 MG/ML IJ SOLN: 6.2500 mg | INTRAMUSCULAR | Status: DC | PRN

## 2021-05-17 MED ORDER — MEPERIDINE HCL 50 MG/ML IJ SOLN: 6.2500 mg | INTRAMUSCULAR | Status: DC | PRN

## 2021-05-17 MED ORDER — ACETAMINOPHEN 10 MG/ML IV SOLN: 1000.0000 mg | Freq: Once | INTRAVENOUS | Status: DC | PRN

## 2021-05-17 MED ORDER — CEFAZOLIN SODIUM-DEXTROSE 2-4 GM/100ML-% IV SOLN
2.0000 g | INTRAVENOUS | Status: AC
  Administered 2021-05-17: 2 g via INTRAVENOUS

## 2021-05-17 MED ORDER — PROPOFOL 10 MG/ML IV BOLUS
INTRAVENOUS | Status: AC
  Filled 2021-05-17: qty 20

## 2021-05-17 MED ORDER — METHOCARBAMOL 500 MG PO TABS
500.0000 mg | ORAL_TABLET | Freq: Four times a day (QID) | ORAL | Status: DC | PRN
  Administered 2021-05-19 – 2021-05-21 (×2): 500 mg via ORAL
  Filled 2021-05-17 (×4): qty 1

## 2021-05-17 MED ORDER — PHENYLEPHRINE HCL-NACL 20-0.9 MG/250ML-% IV SOLN
INTRAVENOUS | Status: DC | PRN
  Administered 2021-05-17: 40 ug/min via INTRAVENOUS

## 2021-05-17 MED ORDER — INSULIN ASPART 100 UNIT/ML IJ SOLN
INTRAMUSCULAR | Status: AC
  Filled 2021-05-17: qty 1

## 2021-05-17 MED ORDER — LACTATED RINGERS IV SOLN: INTRAVENOUS | Status: DC

## 2021-05-17 MED ORDER — SODIUM CHLORIDE 0.9 % IV SOLN
2.0000 g | INTRAVENOUS | Status: DC
  Administered 2021-05-17 – 2021-05-20 (×4): 2 g via INTRAVENOUS
  Filled 2021-05-17 (×4): qty 20

## 2021-05-17 MED ORDER — CEFAZOLIN SODIUM-DEXTROSE 2-4 GM/100ML-% IV SOLN
INTRAVENOUS | Status: AC
  Filled 2021-05-17: qty 100

## 2021-05-17 MED ORDER — ISOPROPYL ALCOHOL 70 % SOLN
Status: DC | PRN
Start: 2021-05-17 — End: 2021-05-17
  Administered 2021-05-17: 1 via TOPICAL

## 2021-05-17 MED ORDER — INSULIN ASPART 100 UNIT/ML IJ SOLN
5.0000 [IU] | Freq: Once | INTRAMUSCULAR | Status: AC
  Administered 2021-05-17: 5 [IU] via SUBCUTANEOUS

## 2021-05-17 MED ORDER — ACETAMINOPHEN 325 MG PO TABS: 325.0000 mg | ORAL_TABLET | Freq: Once | ORAL | Status: DC | PRN

## 2021-05-17 MED ORDER — HYDROMORPHONE HCL 1 MG/ML IJ SOLN
INTRAMUSCULAR | Status: AC
  Administered 2021-05-17: 0.5 mg via INTRAVENOUS
  Filled 2021-05-17: qty 2

## 2021-05-17 MED ORDER — PROPOFOL 10 MG/ML IV BOLUS
INTRAVENOUS | Status: DC | PRN
  Administered 2021-05-17: 40 mg via INTRAVENOUS
  Administered 2021-05-17: 20 mg via INTRAVENOUS
  Administered 2021-05-17: 40 mg via INTRAVENOUS
  Administered 2021-05-17: 20 mg via INTRAVENOUS

## 2021-05-17 MED ORDER — DIPHENHYDRAMINE HCL 12.5 MG/5ML PO ELIX
12.5000 mg | ORAL_SOLUTION | ORAL | Status: DC | PRN
  Administered 2021-05-17: 25 mg via ORAL
  Administered 2021-05-18: 12.5 mg via ORAL
  Administered 2021-05-18 – 2021-05-19 (×4): 25 mg via ORAL
  Filled 2021-05-17 (×6): qty 10

## 2021-05-17 MED ORDER — ROPIVACAINE HCL 5 MG/ML IJ SOLN
INTRAMUSCULAR | Status: DC | PRN
  Administered 2021-05-17: 30 mL via PERINEURAL

## 2021-05-17 MED ORDER — VANCOMYCIN HCL IN DEXTROSE 1-5 GM/200ML-% IV SOLN
1000.0000 mg | INTRAVENOUS | Status: AC
  Administered 2021-05-17: 1000 mg via INTRAVENOUS

## 2021-05-17 MED ORDER — PHENOL 1.4 % MT LIQD: 1.0000 | OROMUCOSAL | Status: DC | PRN

## 2021-05-17 MED ORDER — ALUM & MAG HYDROXIDE-SIMETH 200-200-20 MG/5ML PO SUSP: 30.0000 mL | ORAL | Status: DC | PRN

## 2021-05-17 MED ORDER — ASPIRIN 81 MG PO CHEW
81.0000 mg | CHEWABLE_TABLET | Freq: Two times a day (BID) | ORAL | Status: DC
  Administered 2021-05-17 – 2021-05-21 (×8): 81 mg via ORAL
  Filled 2021-05-17 (×8): qty 1

## 2021-05-17 MED ORDER — TOBRAMYCIN SULFATE 1.2 G IJ SOLR
INTRAMUSCULAR | Status: DC | PRN
  Administered 2021-05-17: 1.2 g

## 2021-05-17 MED ORDER — DEXAMETHASONE SODIUM PHOSPHATE 10 MG/ML IJ SOLN
10.0000 mg | Freq: Once | INTRAMUSCULAR | Status: AC
  Administered 2021-05-18: 10 mg via INTRAVENOUS
  Filled 2021-05-17: qty 1

## 2021-05-17 MED ORDER — METOCLOPRAMIDE HCL 5 MG PO TABS: 5.0000 mg | ORAL_TABLET | Freq: Three times a day (TID) | ORAL | Status: DC | PRN

## 2021-05-17 MED ORDER — HYDROMORPHONE HCL 1 MG/ML IJ SOLN
0.2500 mg | INTRAMUSCULAR | Status: DC | PRN
  Administered 2021-05-17 (×3): 0.5 mg via INTRAVENOUS

## 2021-05-17 MED ORDER — VANCOMYCIN HCL 1000 MG IV SOLR
INTRAVENOUS | Status: AC
  Filled 2021-05-17: qty 20

## 2021-05-17 MED ORDER — AMISULPRIDE (ANTIEMETIC) 5 MG/2ML IV SOLN: 10.0000 mg | Freq: Once | INTRAVENOUS | Status: DC | PRN

## 2021-05-17 MED ORDER — TRANEXAMIC ACID-NACL 1000-0.7 MG/100ML-% IV SOLN
INTRAVENOUS | Status: AC
  Filled 2021-05-17: qty 100

## 2021-05-17 MED ORDER — PRONTOSAN WOUND IRRIGATION OPTIME
TOPICAL | Status: DC | PRN
  Administered 2021-05-17: 1 via TOPICAL

## 2021-05-17 MED ORDER — CHLORHEXIDINE GLUCONATE CLOTH 2 % EX PADS
6.0000 | MEDICATED_PAD | Freq: Every day | CUTANEOUS | Status: DC
  Administered 2021-05-21: 6 via TOPICAL

## 2021-05-17 MED ORDER — TRANEXAMIC ACID-NACL 1000-0.7 MG/100ML-% IV SOLN
1000.0000 mg | Freq: Once | INTRAVENOUS | Status: AC
  Administered 2021-05-17: 1000 mg via INTRAVENOUS
  Filled 2021-05-17: qty 100

## 2021-05-17 SURGICAL SUPPLY — 59 items
ATTUNE PSRP INSR SZ7 8 KNEE (Insert) ×1 IMPLANT
BAG COUNTER SPONGE SURGICOUNT (BAG) IMPLANT
BNDG ELASTIC 4X5.8 VLCR STR LF (GAUZE/BANDAGES/DRESSINGS) ×2 IMPLANT
BNDG ELASTIC 6X5.8 VLCR STR LF (GAUZE/BANDAGES/DRESSINGS) ×3 IMPLANT
CHLORAPREP W/TINT 26 (MISCELLANEOUS) ×4 IMPLANT
COVER SURGICAL LIGHT HANDLE (MISCELLANEOUS) ×2 IMPLANT
CUFF TOURN SGL QUICK 34 (TOURNIQUET CUFF) ×1
CUFF TRNQT CYL 34X4.125X (TOURNIQUET CUFF) ×1 IMPLANT
DERMABOND ADVANCED (GAUZE/BANDAGES/DRESSINGS) ×2
DERMABOND ADVANCED .7 DNX12 (GAUZE/BANDAGES/DRESSINGS) ×2 IMPLANT
DRAPE INCISE IOBAN 66X45 STRL (DRAPES) ×6 IMPLANT
DRAPE SHEET LG 3/4 BI-LAMINATE (DRAPES) ×6 IMPLANT
DRAPE U-SHAPE 47X51 STRL (DRAPES) ×2 IMPLANT
DRESSING PREVENA PLUS CUSTOM (GAUZE/BANDAGES/DRESSINGS) IMPLANT
DRSG AQUACEL AG ADV 3.5X10 (GAUZE/BANDAGES/DRESSINGS) ×2 IMPLANT
DRSG PREVENA PLUS CUSTOM (GAUZE/BANDAGES/DRESSINGS) ×2
DRSG TEGADERM 4X4.75 (GAUZE/BANDAGES/DRESSINGS) ×2 IMPLANT
ELECT REM PT RETURN 15FT ADLT (MISCELLANEOUS) ×2 IMPLANT
EVACUATOR 1/8 PVC DRAIN (DRAIN) ×1 IMPLANT
GAUZE SPONGE 4X4 12PLY STRL (GAUZE/BANDAGES/DRESSINGS) ×3 IMPLANT
GLOVE SRG 8 PF TXTR STRL LF DI (GLOVE) ×1 IMPLANT
GLOVE SURG ENC MOIS LTX SZ8.5 (GLOVE) ×4 IMPLANT
GLOVE SURG ENC TEXT LTX SZ7.5 (GLOVE) ×4 IMPLANT
GLOVE SURG UNDER POLY LF SZ8 (GLOVE) ×1
GLOVE SURG UNDER POLY LF SZ8.5 (GLOVE) ×2 IMPLANT
GOWN STRL REUS W/TWL 2XL LVL3 (GOWN DISPOSABLE) ×2 IMPLANT
HANDPIECE INTERPULSE COAX TIP (DISPOSABLE) ×1
HOOD PEEL AWAY FLYTE STAYCOOL (MISCELLANEOUS) ×2 IMPLANT
IMMOBILIZER KNEE 20 (SOFTGOODS) ×2
IMMOBILIZER KNEE 20 THIGH 36 (SOFTGOODS) IMPLANT
JET LAVAGE IRRISEPT WOUND (IRRIGATION / IRRIGATOR)
KIT STIMULAN RAPID CURE  10CC (Orthopedic Implant) ×1 IMPLANT
KIT STIMULAN RAPID CURE 10CC (Orthopedic Implant) IMPLANT
KIT TURNOVER KIT A (KITS) IMPLANT
LAVAGE JET IRRISEPT WOUND (IRRIGATION / IRRIGATOR) IMPLANT
MARKER SKIN DUAL TIP RULER LAB (MISCELLANEOUS) ×2 IMPLANT
NDL SPNL 18GX3.5 QUINCKE PK (NEEDLE) ×1 IMPLANT
NEEDLE SPNL 18GX3.5 QUINCKE PK (NEEDLE) ×2 IMPLANT
NS IRRIG 1000ML POUR BTL (IV SOLUTION) ×2 IMPLANT
PACK TOTAL KNEE CUSTOM (KITS) ×2 IMPLANT
PADDING CAST COTTON 6X4 STRL (CAST SUPPLIES) ×3 IMPLANT
PROTECTOR NERVE ULNAR (MISCELLANEOUS) ×2 IMPLANT
SET HNDPC FAN SPRY TIP SCT (DISPOSABLE) ×1 IMPLANT
SPIKE FLUID TRANSFER (MISCELLANEOUS) ×2 IMPLANT
STAPLER VISISTAT 35W (STAPLE) ×2 IMPLANT
SUT ETHILON 2 0 PSLX (SUTURE) ×3 IMPLANT
SUT MNCRL AB 3-0 PS2 18 (SUTURE) ×2 IMPLANT
SUT MON AB 2-0 CT1 36 (SUTURE) ×3 IMPLANT
SUT STRATAFIX PDO 1 14 VIOLET (SUTURE) ×1
SUT STRATFX PDO 1 14 VIOLET (SUTURE) ×1
SUT VIC AB 1 CT1 36 (SUTURE) ×2 IMPLANT
SUT VIC AB 2-0 CT1 27 (SUTURE) ×1
SUT VIC AB 2-0 CT1 TAPERPNT 27 (SUTURE) ×1 IMPLANT
SUTURE STRATFX PDO 1 14 VIOLET (SUTURE) ×1 IMPLANT
SWAB COLLECTION DEVICE MRSA (MISCELLANEOUS) ×2 IMPLANT
SWAB CULTURE ESWAB REG 1ML (MISCELLANEOUS) ×2 IMPLANT
SYR 50ML LL SCALE MARK (SYRINGE) ×2 IMPLANT
TRAY FOLEY MTR SLVR 16FR STAT (SET/KITS/TRAYS/PACK) ×2 IMPLANT
WRAP KNEE MAXI GEL POST OP (GAUZE/BANDAGES/DRESSINGS) ×2 IMPLANT

## 2021-05-17 NOTE — Anesthesia Procedure Notes (Signed)
Spinal  Patient location during procedure: OR Start time: 05/17/2021 3:43 PM End time: 05/17/2021 2:47 PM Reason for block: surgical anesthesia Staffing Performed: resident/CRNA  Anesthesiologist: Effie Berkshire, MD Resident/CRNA: Montel Clock, CRNA Preanesthetic Checklist Completed: patient identified, IV checked, risks and benefits discussed, surgical consent, monitors and equipment checked, pre-op evaluation and timeout performed Spinal Block Patient position: sitting Prep: DuraPrep Patient monitoring: heart rate, cardiac monitor, continuous pulse ox and blood pressure Approach: midline Location: L3-4 Injection technique: single-shot Needle Needle type: Sprotte and Pencan  Needle gauge: 24 G Needle length: 10 cm Needle insertion depth: 8 cm Assessment Sensory level: T6 Events: CSF return

## 2021-05-17 NOTE — Progress Notes (Signed)
Pharmacy Antibiotic Note  Micheal Silva is a 56 y.o. male admitted on 05/16/2021 with prosthetic joint infection.  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  Pharmacy has been consulted for Vancomycin dosing.   Plan: Ceftriaxone 2g IV q24h per MD Vancomycin 1750 mg IV q24h (SCr 0.9, Vd 0.5, est AUC 492) Measure Vanc peak and trough as needed.  Goal AUC = 400 - 550. Follow up renal function, culture results, and clinical course.   Height: 5\' 8"  (172.7 cm) Weight: 92 kg (202 lb 13.2 oz) IBW/kg (Calculated) : 68.4  Temp (24hrs), Avg:99.4 F (37.4 C), Min:97.5 F (36.4 C), Max:101.2 F (38.4 C)  Recent Labs  Lab 05/16/21 1214 05/16/21 1441 05/16/21 1443  WBC 8.5 8.0  --   CREATININE 0.82  --  0.92    Estimated Creatinine Clearance: 99.8 mL/min (by C-G formula based on SCr of 0.92 mg/dL).    Allergies  Allergen Reactions   Ativan [Lorazepam] Other (See Comments)    Increased agitation   Crestor [Rosuvastatin Calcium]     Muscle aches   Vicodin [Hydrocodone-Acetaminophen] Nausea And Vomiting   Atorvastatin     Other reaction(s): Leg cramp    Antimicrobials this admission: 2/9 Cefazolin x1 preop 2/9 Vancomycin >>  2/9 Ceftriaxone >>   Dose adjustments this admission:   Microbiology results: 2/8 MRSA PCR: negative 2/9 Surgical culture:    Thank you for allowing pharmacy to be a part of this patients care.  Gretta Arab PharmD, BCPS Clinical Pharmacist [facility] main pharmacy [phone] 05/17/2021 8:02 PM

## 2021-05-17 NOTE — Plan of Care (Signed)
°  Problem: Clinical Measurements: Goal: Ability to maintain clinical measurements within normal limits will improve Outcome: Progressing   Problem: Clinical Measurements: Goal: Will remain free from infection Outcome: Progressing   Problem: Activity: Goal: Risk for activity intolerance will decrease Outcome: Progressing   Problem: Nutrition: Goal: Adequate nutrition will be maintained Outcome: Progressing   

## 2021-05-17 NOTE — Transfer of Care (Signed)
Immediate Anesthesia Transfer of Care Note  Patient: Micheal Silva  Procedure(s) Performed: IRRIGATION AND DEBRIDEMENT KNEE WITH POLY EXCHANGE (Right: Knee)  Patient Location: PACU  Anesthesia Type:GA combined with regional for post-op pain  Level of Consciousness: drowsy and patient cooperative  Airway & Oxygen Therapy: Patient Spontanous Breathing and Patient connected to face mask oxygen  Post-op Assessment: Report given to RN and Post -op Vital signs reviewed and stable  Post vital signs: Reviewed and stable  Last Vitals:  Vitals Value Taken Time  BP 114/61 05/17/21 1820  Temp    Pulse 77 05/17/21 1824  Resp 17 05/17/21 1824  SpO2 100 % 05/17/21 1824  Vitals shown include unvalidated device data.  Last Pain:  Vitals:   05/17/21 1404  TempSrc:   PainSc: 0-No pain      Patients Stated Pain Goal: 3 (16/38/46 6599)  Complications: No notable events documented.

## 2021-05-17 NOTE — Plan of Care (Signed)
  Problem: Elimination: Goal: Will not experience complications related to bowel motility Outcome: Progressing Goal: Will not experience complications related to urinary retention Outcome: Progressing   Problem: Pain Managment: Goal: General experience of comfort will improve Outcome: Progressing   

## 2021-05-17 NOTE — Op Note (Addendum)
OPERATIVE REPORT   05/17/2021  6:21 PM  PATIENT:  Micheal Silva   SURGEON:  Bertram Savin, MD  ASSISTANT: Nehemiah Massed, PA-C   PREOPERATIVE DIAGNOSIS: Surgical site infection right knee.  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  PROCEDURE:  1.  Irrigation and debridement right knee with polyliner exchange. 2.  Application of negative pressure incisional dressing.  ANESTHESIA:   MAC. Adductor canal block. Spinal.  ANTIBIOTICS: 2 g Ancef. 1 g vancomycin.  EXPLANTS: DePuy Attune RP polyliner, size 7, 8 mm PS.   IMPLANTS: DePuy Attune RP polyliner, size 7, 8 mm PS. 25 cc Stimulan beads with 1g vancomycin and 1.2 g Tobramycin.   SPECIMENS: 1. Right knee synovial fluid for cell count with differential, aerobic / anaerobic culture. 2. Right knee synovium for tissue culture.   TUBES AND DRAINS: 1.  Medium Hemovac in right knee joint. 2. 10 mm flat JP drain in subcutaneous tissue. 3.  Negative pressure incisional dressing.   TOURNIQUET TIME: 60 minutes at 300 mmHg.   COMPLICATIONS: None.   DISPOSITION: Stable to PACU.  SURGICAL INDICATIONS:  Micheal Silva is a 56 y.o. male who underwent primary right total knee arthroplasty by Dr. Theda Sers on 03/28/2021.  He was doing very well, and was recently seen for his 6-week postoperative visit last Friday.  The next day, Saturday, he developed erythema and swelling.  On Sunday, his symptoms worsened and he came to the office early this week.  Dr. Theda Sers started him on Keflex. The patient was admitted to the hospital by Dr. Theda Sers, and he asked me for assistance due to my expertise in adult reconstruction.  Upon admission, serum white blood cell count was normal at 8.0.  C-reactive protein elevated at 257 mg/L, and sed rate elevated at 98 mm/h.  He was indicated for debridement of the right knee, possible polyliner exchange.  We discussed the risk, benefits, and alternatives.  He  understands that the biggest risk is failure to eradicate infection, necessitating further surgery.  The risks, benefits, and alternatives were discussed with the patient. There are risks associated with the surgery including, but not limited to, problems with anesthesia (death), infection, instability (giving out of the joint), dislocation, differences in leg length/angulation/rotation, fracture of bones, loosening or failure of implants, hematoma (blood accumulation) which may require surgical drainage, blood clots, pulmonary embolism, nerve injury (foot drop), and blood vessel injury. The patient understands these risks and elects to proceed.  PROCEDURE IN DETAIL: The patient was identified in the holding area using 2 identifiers.  The surgical site was marked by myself.  Regional block was placed by the anesthesia team.  He was taken to the operating room and placed supine on the operating room table.  Spinal anesthesia was obtained.  A Foley catheter was placed.  Nonsterile tourniquet was applied to the right upper thigh.  All bony prominences were well-padded.  The right lower extremity was prepped and draped in the normal sterile surgical fashion.  Timeout was called, verifying side and site of surgery.  He did receive IV antibiotics within 60 minutes of beginning the procedure.  Gravity exsanguination was used, and the tourniquet was elevated to 300 mmHg.  Away from the area of erythema, I inserted an 18-gauge spinal needle into the suprapatellar pouch of the knee using a lateral approach.  I aspirated 40 cc of grossly purulent joint fluid.  The joint fluid was sent to the lab for stat cell count with  differential, Gram stain, and culture.  The lab called stating that the synovial WBC was 116,150 with 95% neutrophils.  Gram stain was not available during the duration of the surgery.  I sharply excised his previous anterior skin excision with a #10 blade.  The draining wound was located in the distal  third in line with the incision.  This area was ellipsed out full-thickness including skin and subcutaneous tissue with a #10 blade.  Full-thickness skin flaps were created.  A rent in the fascia was identified at the superior aspect of the medial parapatellar arthrotomy.  Medial parapatellar arthrotomy was performed.  All suture material was removed.  Purulent joint fluid was encountered.  Medial release was done.  The knee was then brought into extension.  The synovium was inspected.  The synovium was grossly inflamed, as to be expected at this time interval.  A representative sample of synovium was sent for tissue culture.  Radical synovectomy was performed of the medial gutter, lateral gutter, and infrapatellar scar.  Synovial tissue was debrided around the patella as well.  The patellar component was well fixed.  The knee was then flexed.  The polyliner was removed.  Synovectomy was performed of the posterior compartment.  The area behind the tibial tray was meticulously debrided with an angled cement removal tool.  The knee was then irrigated with Prontosan and normal saline with pulse lavage.  The components were debrided using a lap sponge.  The tibial and femoral components were well fixed.  Once I was satisfied that all synovial tissue had been removed and the debridement was adequate, everyone changed their gloves.  The trial 8 mm PS poly was placed and stability testing was performed.  The trial liner was removed.  The real 8 mm posterior stabilized RP polyliner was placed and the knee was reduced.  The knee was stable to varus and valgus stress throughout the range of motion.  The patella tracked centrally using no thumbs technique.  The tourniquet was then let down.  Meticulous hemostasis was achieved with Bovie electrocautery and the Aquamantys.  The wound was then irrigated with normal saline using pulse lavage. On the back table, 25 cc Stimulan beads were mixed with 1 g Vancomycin and 1.2 g  Tobramycin. Once fully cured, the beads were placed into the knee joint.  The arthrotomy was closed using #1 PDS and #1 strata fix over a medium Hemovac drain.  The deep dermal layer was closed with 2-0 Monocryl suture over a 10 mm flat JP drain brought out through a separate stab incision over the lateral proximal thigh.  The skin was reapproximated with 2-0 nylon vertical mattress sutures and staples.  A customizable Pravena negative pressure dressing was applied according to manufacturer's instructions, and negative pressure at 75 mmHg was started.  Bulky dressing was applied with cast padding and an Ace wrap.  A knee immobilizer was placed.  The patient was then awakened from anesthesia, and taken to the PACU in stable condition.  Sponge, needle, and instrument counts were correct at the end of the case x2.  There were no known complications.   POSTOPERATIVE PLAN: Postoperatively, the patient will be readmitted to the orthopedic floor.  He may weight-bear as tolerated right lower extremity with a walker.  Wear knee immobilizer while out of bed.  Start aspirin 81 mg p.o. twice daily for DVT prophylaxis for 6 weeks.  Infectious disease consult will be called for antibiotic selection and duration.  We will discontinue the  Hemovac drain when output is less than 30 cc per shift.  He will likely be discharged with the JP drain.  Monitor operative cultures.  At the time of discharge, the house VAC suction unit will be exchanged for portable Prevena suction unit.  He will need to return to the office within 7 days of discharge for removal of his negative pressure dressing.

## 2021-05-17 NOTE — Anesthesia Procedure Notes (Signed)
Anesthesia Regional Block: Adductor canal block   Pre-Anesthetic Checklist: , timeout performed,  Correct Patient, Correct Site, Correct Laterality,  Correct Procedure, Correct Position, site marked,  Risks and benefits discussed,  Surgical consent,  Pre-op evaluation,  At surgeon's request and post-op pain management  Laterality: Right  Prep: chloraprep       Needles:  Injection technique: Single-shot  Needle Type: Echogenic Stimulator Needle     Needle Length: 9cm  Needle Gauge: 21     Additional Needles:   Procedures:,,,, ultrasound used (permanent image in chart),,    Narrative:  Start time: 05/17/2021 1:55 PM End time: 05/17/2021 2:00 PM Injection made incrementally with aspirations every 5 mL.  Performed by: Personally  Anesthesiologist: Effie Berkshire, MD  Additional Notes: Patient tolerated the procedure well. Local anesthetic introduced in an incremental fashion under minimal resistance after negative aspirations. No paresthesias were elicited. After completion of the procedure, no acute issues were identified and patient continued to be monitored by RN.

## 2021-05-17 NOTE — Anesthesia Preprocedure Evaluation (Addendum)
Anesthesia Evaluation  Patient identified by MRN, date of birth, ID band Patient awake    Reviewed: Allergy & Precautions, NPO status , Patient's Chart, lab work & pertinent test results  Airway Mallampati: I  TM Distance: >3 FB Neck ROM: Full    Dental  (+) Teeth Intact, Dental Advisory Given   Pulmonary sleep apnea and Continuous Positive Airway Pressure Ventilation ,    breath sounds clear to auscultation       Cardiovascular hypertension,  Rhythm:Regular Rate:Normal     Neuro/Psych PSYCHIATRIC DISORDERS Anxiety Depression    GI/Hepatic negative GI ROS, Neg liver ROS,   Endo/Other  diabetes  Renal/GU negative Renal ROS     Musculoskeletal  (+) Arthritis ,   Abdominal Normal abdominal exam  (+)   Peds  Hematology negative hematology ROS (+)   Anesthesia Other Findings   Reproductive/Obstetrics                            Anesthesia Physical Anesthesia Plan  ASA: 2  Anesthesia Plan: Spinal   Post-op Pain Management: Regional block   Induction: Intravenous  PONV Risk Score and Plan: 2 and Ondansetron and Propofol infusion  Airway Management Planned: Natural Airway and Simple Face Mask  Additional Equipment: None  Intra-op Plan:   Post-operative Plan:   Informed Consent: I have reviewed the patients History and Physical, chart, labs and discussed the procedure including the risks, benefits and alternatives for the proposed anesthesia with the patient or authorized representative who has indicated his/her understanding and acceptance.     Dental advisory given  Plan Discussed with: CRNA  Anesthesia Plan Comments: (Lab Results      Component                Value               Date                      WBC                      8.0                 05/16/2021                HGB                      11.4 (L)            05/16/2021                HCT                      34.6 (L)             05/16/2021                MCV                      88.7                05/16/2021                PLT                      369                 05/16/2021           )  Anesthesia Quick Evaluation

## 2021-05-17 NOTE — Interval H&P Note (Signed)
History and Physical Interval Note:  05/17/2021 2:18 PM  Micheal Silva  has presented today for surgery, with the diagnosis of POST SURGICAL WOUND INFECTION RIGHT KNEE.  The various methods of treatment have been discussed with the patient and family. After consideration of risks, benefits and other options for treatment, the patient has consented to  Procedure(s): IRRIGATION AND DEBRIDEMENT KNEE WITH POLY EXCHANGE (Right) as a surgical intervention.  The patient's history has been reviewed, patient examined, no change in status, stable for surgery.  I have reviewed the patient's chart and labs.  Questions were answered to the patient's satisfaction.    R knee wound in distal 1/3 of incision draining purulent fluid ESR 98 mm/hr CRP 257 mg/L Serum WBC 8.0  Plan for I&D, possible poly exchange. Biggest risk includes needing additional surgery for infection  Hilton Cork Jabrea Kallstrom

## 2021-05-17 NOTE — Anesthesia Postprocedure Evaluation (Signed)
Anesthesia Post Note  Patient: Micheal Silva  Procedure(s) Performed: IRRIGATION AND DEBRIDEMENT KNEE WITH POLY EXCHANGE (Right: Knee)     Patient location during evaluation: PACU Anesthesia Type: Spinal, MAC and Regional Level of consciousness: awake and alert and oriented Pain management: pain level controlled Vital Signs Assessment: post-procedure vital signs reviewed and stable Respiratory status: spontaneous breathing, nonlabored ventilation and respiratory function stable Cardiovascular status: blood pressure returned to baseline and stable Postop Assessment: no headache, no backache, spinal receding and no apparent nausea or vomiting Anesthetic complications: no   No notable events documented.  Last Vitals:  Vitals:   05/17/21 1900 05/17/21 1915  BP: 126/81 130/83  Pulse: 86 73  Resp: 12 12  Temp:    SpO2: 100% 98%    Last Pain:  Vitals:   05/17/21 1915  TempSrc:   PainSc: 4     LLE Motor Response: Purposeful movement (05/17/21 1915) LLE Sensation: Numbness;Decreased (05/17/21 1915) RLE Motor Response: Purposeful movement (05/17/21 1915) RLE Sensation: Decreased;Numbness (05/17/21 1915) L Sensory Level: S1-Sole of foot, small toes (05/17/21 1915) R Sensory Level: S1-Sole of foot, small toes (05/17/21 1915)  [place]

## 2021-05-17 NOTE — Progress Notes (Signed)
Assisted Dr. Hollis with right, ultrasound guided, adductor canal block. Side rails up, monitors on throughout procedure. See vital signs in flow sheet. Tolerated Procedure well.  

## 2021-05-18 ENCOUNTER — Encounter (HOSPITAL_COMMUNITY): Payer: Self-pay | Admitting: Orthopedic Surgery

## 2021-05-18 DIAGNOSIS — T8451XD Infection and inflammatory reaction due to internal right hip prosthesis, subsequent encounter: Secondary | ICD-10-CM

## 2021-05-18 LAB — GLUCOSE, CAPILLARY
Glucose-Capillary: 268 mg/dL — ABNORMAL HIGH (ref 70–99)
Glucose-Capillary: 280 mg/dL — ABNORMAL HIGH (ref 70–99)
Glucose-Capillary: 267 mg/dL — ABNORMAL HIGH (ref 70–99)
Glucose-Capillary: 255 mg/dL — ABNORMAL HIGH (ref 70–99)

## 2021-05-18 LAB — CBC
HCT: 33.5 % — ABNORMAL LOW (ref 39.0–52.0)
Hemoglobin: 11.2 g/dL — ABNORMAL LOW (ref 13.0–17.0)
MCH: 29.2 pg (ref 26.0–34.0)
MCHC: 33.4 g/dL (ref 30.0–36.0)
MCV: 87.2 fL (ref 80.0–100.0)
Platelets: 408 10*3/uL — ABNORMAL HIGH (ref 150–400)
RBC: 3.84 MIL/uL — ABNORMAL LOW (ref 4.22–5.81)
RDW: 14.4 % (ref 11.5–15.5)
WBC: 10.9 10*3/uL — ABNORMAL HIGH (ref 4.0–10.5)
nRBC: 0 % (ref 0.0–0.2)

## 2021-05-18 LAB — BASIC METABOLIC PANEL
Anion gap: 11 (ref 5–15)
BUN: 11 mg/dL (ref 6–20)
CO2: 23 mmol/L (ref 22–32)
Calcium: 8 mg/dL — ABNORMAL LOW (ref 8.9–10.3)
Chloride: 94 mmol/L — ABNORMAL LOW (ref 98–111)
Creatinine, Ser: 0.7 mg/dL (ref 0.61–1.24)
GFR, Estimated: >60 mL/min (ref >60)
Glucose, Bld: 266 mg/dL — ABNORMAL HIGH (ref 70–99)
Potassium: 4.1 mmol/L (ref 3.5–5.1)
Sodium: 128 mmol/L — ABNORMAL LOW (ref 135–145)

## 2021-05-18 MED ORDER — ASPIRIN 81 MG PO CHEW: 81.0000 mg | CHEWABLE_TABLET | Freq: Two times a day (BID) | ORAL | 0 refills | Status: AC

## 2021-05-18 MED ORDER — DOCUSATE SODIUM 100 MG PO CAPS: 100.0000 mg | ORAL_CAPSULE | Freq: Two times a day (BID) | ORAL | 0 refills | Status: DC

## 2021-05-18 MED ORDER — OXYCODONE HCL 5 MG PO TABS: 5.0000 mg | ORAL_TABLET | ORAL | 0 refills | Status: DC | PRN

## 2021-05-18 MED ORDER — SENNA 8.6 MG PO TABS: 2.0000 | ORAL_TABLET | Freq: Every day | ORAL | 0 refills | Status: DC

## 2021-05-18 MED ORDER — ONDANSETRON HCL 4 MG PO TABS: 4.0000 mg | ORAL_TABLET | Freq: Four times a day (QID) | ORAL | 0 refills | Status: DC | PRN

## 2021-05-18 MED ORDER — POLYETHYLENE GLYCOL 3350 17 G PO PACK: 17.0000 g | PACK | Freq: Every day | ORAL | 0 refills | Status: DC | PRN

## 2021-05-18 NOTE — Progress Notes (Addendum)
Subjective:  Patient reports pain as mild to moderate.  Denies N/V/CP/SOB. C/o anterior shin paresthesias - AC Ewrap readjusted, will monitor.  Objective:   VITALS:   Vitals:   05/17/21 1930 05/17/21 2212 05/18/21 0004 05/18/21 0546  BP: (!) 134/92 (!) 147/88 (!) 153/88 (!) 155/94  Pulse: 78 95 87 88  Resp: 13 17 16 17   Temp: (!) 97.5 F (36.4 C) 99.3 F (37.4 C) 98.7 F (37.1 C) 98.8 F (37.1 C)  TempSrc:  Oral Oral Oral  SpO2: 100% 100% 99% 98%  Weight:      Height:       HV: 80 cc JP: 5 cc  NAD ABD soft Sensation intact distally Intact pulses distally Dorsiflexion/Plantar flexion intact Incision: dressing C/D/I Compartment soft iVAC intact without leak HV ss JP ss  Lab Results  Component Value Date   WBC 10.9 (H) 05/18/2021   HGB 11.2 (L) 05/18/2021   HCT 33.5 (L) 05/18/2021   MCV 87.2 05/18/2021   PLT 408 (H) 05/18/2021   BMET    Component Value Date/Time   NA 128 (L) 05/18/2021 0318   K 4.1 05/18/2021 0318   CL 94 (L) 05/18/2021 0318   CO2 23 05/18/2021 0318   GLUCOSE 266 (H) 05/18/2021 0318   BUN 11 05/18/2021 0318   CREATININE 0.70 05/18/2021 0318   CALCIUM 8.0 (L) 05/18/2021 0318   GFRNONAA >60 05/18/2021 0318    Recent Results (from the past 240 hour(s))  Resp Panel by RT-PCR (Flu A&B, Covid) Nasopharyngeal Swab     Status: None   Collection Time: 05/16/21 12:14 PM   Specimen: Nasopharyngeal Swab; Nasopharyngeal(NP) swabs in vial transport medium  Result Value Ref Range Status   SARS Coronavirus 2 by RT PCR NEGATIVE NEGATIVE Final    Comment: (NOTE) SARS-CoV-2 target nucleic acids are NOT DETECTED.  The SARS-CoV-2 RNA is generally detectable in upper respiratory specimens during the acute phase of infection. The lowest concentration of SARS-CoV-2 viral copies this assay can detect is 138 copies/mL. A negative result does not preclude SARS-Cov-2 infection and should not be used as the sole basis for treatment or other patient  management decisions. A negative result may occur with  improper specimen collection/handling, submission of specimen other than nasopharyngeal swab, presence of viral mutation(s) within the areas targeted by this assay, and inadequate number of viral copies(<138 copies/mL). A negative result must be combined with clinical observations, patient history, and epidemiological information. The expected result is Negative.  Fact Sheet for Patients:  EntrepreneurPulse.com.au  Fact Sheet for Healthcare Providers:  IncredibleEmployment.be  This test is no t yet approved or cleared by the [place] FDA and  has been authorized for detection and/or diagnosis of SARS-CoV-2 by FDA under an Emergency Use Authorization (EUA). This EUA will remain  in effect (meaning this test can be used) for the duration of the COVID-19 declaration under Section 564(b)(1) of the Act, 21 U.S.C.section 360bbb-3(b)(1), unless the authorization is terminated  or revoked sooner.       Influenza A by PCR NEGATIVE NEGATIVE Final   Influenza B by PCR NEGATIVE NEGATIVE Final    Comment: (NOTE) The Xpert Xpress SARS-CoV-2/FLU/RSV plus assay is intended as an aid in the diagnosis of influenza from Nasopharyngeal swab specimens and should not be used as a sole basis for treatment. Nasal washings and aspirates are unacceptable for Xpert Xpress SARS-CoV-2/FLU/RSV testing.  Fact Sheet for Patients: EntrepreneurPulse.com.au  Fact Sheet for Healthcare Providers: IncredibleEmployment.be  This test is  not yet approved or cleared by the [place] and has been authorized for detection and/or diagnosis of SARS-CoV-2 by FDA under an Emergency Use Authorization (EUA). This EUA will remain in effect (meaning this test can be used) for the duration of the COVID-19 declaration under Section 564(b)(1) of the Act, 21 U.S.C. section 360bbb-3(b)(1),  unless the authorization is terminated or revoked.  Performed at [facility], [place] [address] [place], [place] [address]   Surgical PCR screen     Status: None   Collection Time: 05/16/21  1:21 PM   Specimen: Nasal Mucosa; Nasal Swab  Result Value Ref Range Status   MRSA, PCR NEGATIVE NEGATIVE Final   Staphylococcus aureus NEGATIVE NEGATIVE Final    Comment: (NOTE) The Xpert SA Assay (FDA approved for NASAL specimens in patients 64 years of age and older), is one component of a comprehensive surveillance program. It is not intended to diagnose infection nor to guide or monitor treatment. Performed at [facility], [place] [address], [place], [place] [address]   Aerobic/Anaerobic Culture w Gram Stain (surgical/deep wound)     Status: None (Preliminary result)   Collection Time: 05/17/21  2:07 PM   Specimen: PATH Cytology Misc. fluid; Body Fluid  Result Value Ref Range Status   Specimen Description   Final    KNEE RIGHT SYNOVIUM Performed at [facility] [address], [place], [place] [address]    Special Requests   Final    NONE Performed at [facility], [place] [address], [place], [place] [address]    Gram Stain   Final    NO SQUAMOUS EPITHELIAL CELLS SEEN MODERATE WBC SEEN NO ORGANISMS SEEN Performed at [facility], [place] [address], [place], [place] [address]    Culture PENDING  Incomplete   Report Status PENDING  Incomplete  Aerobic/Anaerobic Culture w Gram Stain (surgical/deep wound)     Status: None (Preliminary result)   Collection Time: 05/17/21  3:34 PM   Specimen: PATH Soft tissue  Result Value Ref Range Status   Specimen Description   Final    KNEE RIGHT Performed at [facility], [place] [address], [place], [place] [address]    Special Requests   Final    NONE Performed at [facility] [place], [place] [address], [place], [place] [address]    Gram  Stain   Final    NO SQUAMOUS EPITHELIAL CELLS SEEN MODERATE WBC SEEN NO ORGANISMS SEEN Performed at [facility], [place] [address], [place], [place] [address]    Culture PENDING  Incomplete   Report Status PENDING  Incomplete      Assessment/Plan: 1 Day Post-Op   Principal Problem:   Surgical site infection   WBAT RLE with walker, KI when OOB DVT ppx:  ASA 81 mg PO BID, SCDs, TEDS PO pain control PT/OT Postop R knee PJI: ID consult called, will need PICC, IV Ceftriaxone and Vancomycin for now, Cont iVAC, follow intraop cultures Cont HV drain Cont JP drain Dispo: D/C HV drain when output < 30 cc/shift, Retain JP drain, D/C planning, HH IV abx, will need long term IV abx, RN to switch house VAC suction unit to portable Prevena unit upon d/c, call to schedule a f/u appointment within 7 days of discharge for removal of negative pressure dressing   Hilton Cork Navy Belay 05/18/2021, 11:01 AM   Rod Can, MD [phone] [place] is now [employer]  [place] [address] [place], [place], [place] [address] Phone: [phone] [email or web address] Facebook   [employer]

## 2021-05-18 NOTE — Progress Notes (Signed)
Inpatient Diabetes Program Recommendations  AACE/ADA: New Consensus Statement on Inpatient Glycemic Control (2015)  Target Ranges:  Prepandial:   less than 140 mg/dL      Peak postprandial:   less than 180 mg/dL (1-2 hours)      Critically ill patients:  140 - 180 mg/dL    Latest Reference Range & Units 05/16/21 14:43  Hemoglobin A1C 4.8 - 5.6 % 6.9 (H)    Latest Reference Range & Units 05/17/21 08:03 05/17/21 12:01 05/17/21 14:41 05/17/21 18:23 05/17/21 20:35  Glucose-Capillary 70 - 99 mg/dL 225 (H)  3 units Novolog  183 (H)  2 units Novolog  204 (H) 208 (H)  5 units Novolog  182 (H)    Latest Reference Range & Units 05/18/21 07:20  Glucose-Capillary 70 - 99 mg/dL 268 (H)  (H): Data is abnormally high     Home DM Meds: None listed  Current Orders: Novolog Sensitive Correction Scale/ SSI (0-9 units) TID AC + HS    MD- Note AM CBGs have been elevated >200 likely due to infection  May consider adding low dose basal insulin to in-hospital insulin regimen:  Semglee 9 units Daily (0.1 units/kg)    --Will follow patient during hospitalization--  Wyn Quaker RN, MSN, CDE Diabetes Coordinator Inpatient Glycemic Control Team Team Pager: [phone] (8a-5p)

## 2021-05-18 NOTE — Plan of Care (Signed)
  Problem: Health Behavior/Discharge Planning: Goal: Ability to manage health-related needs will improve Outcome: Progressing   

## 2021-05-18 NOTE — Evaluation (Signed)
Physical Therapy Evaluation Patient Details Name: Micheal Silva MRN: [id number] DOB: 02/21/1966 Today's Date: 05/18/2021  History of Present Illness  56yo male s/p R TKA I&D with poly exchange, PMH: R TKR 03/28/21, opiate abuse, OA, DM, HTN, depression, obesity, sleep apnea  Clinical Impression  Pt admitted as above and presenting with functional mobility limitations 2* decreased R LE strength/ROM and post op pain.  Pt should progress to dc home with family assist.      Recommendations for follow up therapy are one component of a multi-disciplinary discharge planning process, led by the attending physician.  Recommendations may be updated based on patient status, additional functional criteria and insurance authorization.  Follow Up Recommendations Follow physician's recommendations for discharge plan and follow up therapies    Assistance Recommended at Discharge Intermittent Supervision/Assistance  Patient can return home with the following  A little help with walking and/or transfers;A little help with bathing/dressing/bathroom;Assistance with cooking/housework;Assist for transportation;Help with stairs or ramp for entrance    Equipment Recommendations Rolling walker (2 wheels)  Recommendations for Other Services       Functional Status Assessment Patient has had a recent decline in their functional status and demonstrates the ability to make significant improvements in function in a reasonable and predictable amount of time.     Precautions / Restrictions Precautions Precautions: Fall;Knee Precaution Comments: instructed no pillow under knee Required Braces or Orthoses: Knee Immobilizer - Right Knee Immobilizer - Right: On when out of bed or walking Restrictions Weight Bearing Restrictions: No RLE Weight Bearing: Weight bearing as tolerated      Mobility  Bed Mobility Overal bed mobility: Needs Assistance Bed Mobility: Supine to Sit     Supine to sit: Min guard      General bed mobility comments: min quard for R LE    Transfers Overall transfer level: Needs assistance Equipment used: Rolling walker (2 wheels) Transfers: Sit to/from Stand Sit to Stand: Min guard           General transfer comment: cues for LE management and use of UEs to self assist    Ambulation/Gait Ambulation/Gait assistance: Min guard Gait Distance (Feet): 68 Feet Assistive device: Rolling walker (2 wheels) Gait Pattern/deviations: Step-to pattern, Decreased stance time - right, Decreased weight shift to right Gait velocity: decreased     General Gait Details: min cues for posture, position from RW and sequence  Stairs            Wheelchair Mobility    Modified Rankin (Stroke Patients Only)       Balance Overall balance assessment: Mild deficits observed, not formally tested                                           Pertinent Vitals/Pain Pain Assessment Pain Assessment: 0-10 Pain Score: 6  Pain Location: right knee Pain Descriptors / Indicators: Aching, Grimacing, Sore Pain Intervention(s): Limited activity within patient's tolerance, Monitored during session, Premedicated before session, Ice applied    Home Living Family/patient expects to be discharged to:: Private residence Living Arrangements: Spouse/significant other;Children Available Help at Discharge: Family Type of Home: House Home Access: Stairs to enter Entrance Stairs-Rails: None Entrance Stairs-Number of Steps: 2+1   Home Layout: One level Home Equipment: Public relations account executive (2 wheels)      Prior Function Prior Level of Function : Independent/Modified Independent  Mobility Comments: Pt had had excellent recovery with IND in all activities and 124 degrees of flex at knee       Hand Dominance        Extremity/Trunk Assessment   Upper Extremity Assessment Upper Extremity Assessment: Overall WFL for tasks assessed    Lower  Extremity Assessment Lower Extremity Assessment: RLE deficits/detail RLE Deficits / Details: ankle WFL, knee extension and hip flexion 2+/5 RLE: Unable to fully assess due to pain    Cervical / Trunk Assessment Cervical / Trunk Assessment: Normal  Communication   Communication: No difficulties  Cognition Arousal/Alertness: Awake/alert Behavior During Therapy: WFL for tasks assessed/performed Overall Cognitive Status: Within Functional Limits for tasks assessed                                 General Comments: AxO x 3        General Comments      Exercises Total Joint Exercises Ankle Circles/Pumps: AROM, 10 reps, Both   Assessment/Plan    PT Assessment Patient needs continued PT services  PT Problem List Decreased strength;Decreased mobility;Decreased range of motion;Decreased activity tolerance;Decreased balance;Pain;Decreased knowledge of use of DME       PT Treatment Interventions DME instruction;Therapeutic activities;Gait training;Functional mobility training;Therapeutic exercise;Patient/family education;Stair training    PT Goals (Current goals can be found in the Care Plan section)  Acute Rehab PT Goals Patient Stated Goal: home PT Goal Formulation: With patient Time For Goal Achievement: 04/04/21 Potential to Achieve Goals: Good    Frequency 7X/week     Co-evaluation               AM-PAC PT "6 Clicks" Mobility  Outcome Measure Help needed turning from your back to your side while in a flat bed without using bedrails?: A Little Help needed moving from lying on your back to sitting on the side of a flat bed without using bedrails?: A Little Help needed moving to and from a bed to a chair (including a wheelchair)?: A Little Help needed standing up from a chair using your arms (e.g., wheelchair or bedside chair)?: A Little Help needed to walk in hospital room?: A Little Help needed climbing 3-5 steps with a railing? : A Little 6 Click  Score: 18    End of Session Equipment Utilized During Treatment: Gait belt Activity Tolerance: Patient limited by pain;Patient limited by fatigue Patient left: in bed;with call bell/phone within reach Nurse Communication: Mobility status PT Visit Diagnosis: Other abnormalities of gait and mobility (R26.89);Difficulty in walking, not elsewhere classified (R26.2)    Time: 1751-0258 PT Time Calculation (min) (ACUTE ONLY): 28 min   Charges:   PT Evaluation $PT Eval Low Complexity: 1 Low          [place] Pager [phone] Office [phone]   Nechuma Boven 05/18/2021, 11:00 AM

## 2021-05-18 NOTE — Consult Note (Signed)
[facility] for Infectious Disease    Date of Admission:  05/16/2021   Total days of inpatient antibiotics 1        Reason for Consult: PJI    Principal Problem:   Surgical site infection   Assessment: 56 YM with DM (A1c 6.9), multiple intervention of right knee including partial medical meniscectomy, RTKA on 03/28/21 developed erythema on R knee over the course a day on 05/12/21. Stated on keflex by Ortho on 05/14/21 and admitted for I&D.  #PJI(Right TKA) with  draining sinus tract #SP I&D and polyexchange on 05/17/21 with Cx pending Pre-hospitalization antibiotics: Keflex Risk factors: Multiple intervention on left knee including meniscal repair OR findings: noted purulence while aspirating suprapatellar pouch(Cx sent), Purulent joint fluid.  Recommendations:  -Continue vancomycin and ceftriaxone. Anticipate 6 weeks of antibiotics followed by suppressive regimen pending surgical plan (if no plan for explantation).  -Follow OR Cx  Microbiology:   Antibiotics: Vancomycin and ceftriaxone Keflex-prehospitalization  Cultures: 2/9 OR Cx: pending. No organisms seen   HPI: NASEAN ZAPF is a 56 y.o. male with anxiety, DM(A1C 6.9 on 05/16/21), HTN, HLD, depression, OSA on CPAP, RTKA by Dr. Theda Sers on 03/28/21. Seen in Ortho clinic for 6 week post-op visit on Friday and was doing well. Next day pt developed erythema and swelling. Started on keflex early this week and planned admission to the hospital.  On admission CRP 257. ESR 98.  Taken to OR for I&D and poly-exchange.  Post-op started on vancomycin and ceftriaxone. ID engaged for antibiotic recommendation.    Review of Systems: Review of Systems  All other systems reviewed and are negative.  Past Medical History:  Diagnosis Date   Anxiety    Arthritis    HANDS BIL   BPH (benign prostatic hyperplasia)    Depression    Diabetes mellitus (HCC)    type 2   Difficult intubation    Heart murmur    HTN (hypertension)     Hyperlipidemia    Hypogonadism in male    Insomnia    Obesity    Right-sided chest wall pain 12/25/2015   Sleep apnea    CPAP    Social History   Tobacco Use   Smoking status: Never   Smokeless tobacco: Never  Vaping Use   Vaping Use: Never used  Substance Use Topics   Alcohol use: No    Alcohol/week: 0.0 standard drinks   Drug use: Not Currently    Family History  Problem Relation Age of Onset   Prostate cancer Father    Leukemia Father    Diabetes Mellitus II Unknown    Coronary artery disease Unknown    Hypertension Unknown    Breast cancer Mother    Scheduled Meds:  aspirin  81 mg Oral BID   buprenorphine-naloxone  1 tablet Sublingual Daily   buPROPion  300 mg Oral Daily   And   buPROPion  150 mg Oral QHS   Chlorhexidine Gluconate Cloth  6 each Topical Daily   cloNIDine  0.1 mg Oral BID   dexamethasone (DECADRON) injection  10 mg Intravenous Once   docusate sodium  100 mg Oral BID   gabapentin  300 mg Oral QHS   insulin aspart  0-5 Units Subcutaneous QHS   insulin aspart  0-9 Units Subcutaneous TID WC   oxymetazoline  1 spray Each Nare BID   senna  1 tablet Oral BID   Continuous Infusions:  sodium chloride  100 mL/hr at 05/18/21 9417   cefTRIAXone (ROCEPHIN)  IV 2 g (05/17/21 2123)   methocarbamol (ROBAXIN) IV     methocarbamol (ROBAXIN) IV Stopped (05/17/21 1940)   vancomycin 1,750 mg (05/18/21 0333)   PRN Meds:.acetaminophen, alum & mag hydroxide-simeth, diphenhydrAMINE, HYDROmorphone (DILAUDID) injection, menthol-cetylpyridinium **OR** phenol, methocarbamol **OR** methocarbamol (ROBAXIN) IV, methocarbamol **OR** methocarbamol (ROBAXIN) IV, metoCLOPramide **OR** metoCLOPramide (REGLAN) injection, ondansetron **OR** ondansetron (ZOFRAN) IV, oxyCODONE, oxyCODONE, polyethylene glycol, sodium phosphate, sorbitol Allergies  Allergen Reactions   Ativan [Lorazepam] Other (See Comments)    Increased agitation   Crestor [Rosuvastatin Calcium]     Muscle aches    Vicodin [Hydrocodone-Acetaminophen] Nausea And Vomiting   Atorvastatin     Other reaction(s): Leg cramp    OBJECTIVE: Blood pressure (!) 153/88, pulse 87, temperature 98.7 F (37.1 C), temperature source Oral, resp. rate 16, height 5' 8"  (1.727 m), weight 92 kg, SpO2 99 %.  Physical Exam Constitutional:      General: He is not in acute distress.    Appearance: He is normal weight. He is not toxic-appearing.  HENT:     Head: Normocephalic and atraumatic.     Right Ear: External ear normal.     Left Ear: External ear normal.     Nose: No congestion or rhinorrhea.     Mouth/Throat:     Mouth: Mucous membranes are moist.     Pharynx: Oropharynx is clear.  Eyes:     Extraocular Movements: Extraocular movements intact.     Conjunctiva/sclera: Conjunctivae normal.     Pupils: Pupils are equal, round, and reactive to light.  Cardiovascular:     Rate and Rhythm: Normal rate and regular rhythm.     Heart sounds: No murmur heard.   No friction rub. No gallop.  Pulmonary:     Effort: Pulmonary effort is normal.     Breath sounds: Normal breath sounds.  Abdominal:     General: Abdomen is flat. Bowel sounds are normal.     Palpations: Abdomen is soft.  Musculoskeletal:     Cervical back: Normal range of motion and neck supple.     Comments: Right knee immobilized   Skin:    General: Skin is warm and dry.  Neurological:     General: No focal deficit present.     Mental Status: He is oriented to person, place, and time.  Psychiatric:        Mood and Affect: Mood normal.    Lab Results Lab Results  Component Value Date   WBC 10.9 (H) 05/18/2021   HGB 11.2 (L) 05/18/2021   HCT 33.5 (L) 05/18/2021   MCV 87.2 05/18/2021   PLT 408 (H) 05/18/2021    Lab Results  Component Value Date   CREATININE 0.70 05/18/2021   BUN 11 05/18/2021   NA 128 (L) 05/18/2021   K 4.1 05/18/2021   CL 94 (L) 05/18/2021   CO2 23 05/18/2021    Lab Results  Component Value Date   ALT 22  05/16/2021   AST 22 05/16/2021   ALKPHOS 90 05/16/2021   BILITOT 0.4 05/16/2021       Laurice Record, [facility] for Infectious [facility] 05/18/2021, 5:33 AM

## 2021-05-18 NOTE — Discharge Instructions (Addendum)
Weightbearing as tolerated right lower extremity with walker. Knee immobilizer when out of bed. Keep dressing clean and dry. Charge VAC suction unit every night. Elevate toes above nose on a stack of blankets, couch cushions, or pillows.  You must lie down in order to accomplish this. You may open the knee immobilizer to ice your knee.  Ice knee very frequently. Take antibiotics as directed by infectious disease. Keep all follow-up appointments with infectious disease. Call [phone] to schedule a follow-up appointment with Dr. Lyla Glassing as soon as possible to be seen within 7 days of discharge for incisional VAC removal.  OPAT Orders Discharge antibiotics to be given via PICC line Discharge antibiotics: IV penicillin 24 million units daily continuous infusion Per pharmacy protocol yes Duration: 6 weeks End Date: March 23rd, 2023   Anon Raices Per Protocol: yes   Home health RN for IV administration and teaching; PICC line care and labs.     Labs weekly while on IV antibiotics: _x_ CBC with differential __ BMP _x_ CMP __x CRP _x_ ESR __ Vancomycin trough __ CK   _x_ Please pull PIC at completion of IV antibiotics __ Please leave PIC in place until doctor has seen patient or been notified   Fax weekly labs to [phone]   Clinic Follow Up Appt: 06/08/21   @  4pm with Dr. Linus Salmons Can be in-person or virtual

## 2021-05-18 NOTE — TOC Initial Note (Addendum)
Transition of Care [facility]) - Initial/Assessment Note   Patient Details  Name: Micheal Silva MRN: [id number] Date of Birth: 07-06-1965  Transition of Care [facility]) CM/SW Contact:    Sherie Don, LCSW Phone Number: 05/18/2021, 11:49 AM  Clinical Narrative: Patient's tentative discharge plan is to go home with IV antibiotics. CSW followed up with patient to inform him [facility] will be following him in the event he will need home IV antibiotics. Patient agreeable. TOC to follow.  AddendumJeannene Patella with [facility] has set the patient up with [facility] through [facility].  Expected Discharge Plan: [place] Barriers to Discharge: Continued Medical Work up  Patient Goals and CMS Choice Patient states their goals for this hospitalization and ongoing recovery are:: Discharge home with IV antibiotics and [facility] CMS Medicare.gov Compare Post Acute Care list provided to:: Patient Choice offered to / list presented to : Patient  Expected Discharge Plan and Services Expected Discharge Plan: [place] In-house Referral: Clinical Social Work Post Acute Care Choice: [place] arrangements for the past 2 months: [place]             DME Arranged: N/A DME Agency: NA HH Arranged: IV Antibiotics HH Agency: [facility] Date HH Agency Contacted: 05/18/21 Representative spoke with at [place]: [place]  Prior Living Arrangements/Services Living arrangements for the past 2 months: [place] Lives with:: Spouse Patient language and need for interpreter reviewed:: Yes Do you feel safe going back to the place where you live?: Yes      Need for Family Participation in Patient Care: No (Comment) Care giver support system in place?: Yes (comment) Current home services: DME (Rolling walker) Criminal Activity/Legal Involvement Pertinent to Current Situation/Hospitalization: No - Comment as needed  Activities of Daily Living Home Assistive Devices/Equipment: CBG Meter,  Blood pressure cuff, Eyeglasses, Grab bars in shower, Hand-held shower hose, Shower chair without back ADL Screening (condition at time of admission) Patient's cognitive ability adequate to safely complete daily activities?: Yes Is the patient deaf or have difficulty hearing?: No Does the patient have difficulty seeing, even when wearing glasses/contacts?: No Does the patient have difficulty concentrating, remembering, or making decisions?: No Patient able to express need for assistance with ADLs?: Yes Does the patient have difficulty dressing or bathing?: No Independently performs ADLs?: Yes (appropriate for developmental age) Does the patient have difficulty walking or climbing stairs?: No Weakness of Legs: Right Weakness of Arms/Hands: None  Permission Sought/Granted Permission sought to share information with : Other (comment) Permission granted to share information with : Yes, Verbal Permission Granted Permission granted to share info w AGENCY: [facility], [place] agencies  Emotional Assessment Appearance:: Appears stated age Attitude/Demeanor/Rapport: Engaged Affect (typically observed): Appropriate Orientation: : Oriented to Self, Oriented to Place, Oriented to  Time, Oriented to Situation Alcohol / Substance Use: Not Applicable Psych Involvement: No (comment)  Admission diagnosis:  Surgical site infection [T81.49XA] Patient Active Problem List   Diagnosis Date Noted   Surgical site infection 05/16/2021   Osteoarthritis of right knee 03/28/2021   History of colonic polyps 01/17/2017   Opiate withdrawal ([place]) 06/12/2016   Opiate abuse, episodic ([place]) 06/12/2016   Abdominal pain, chronic, right upper quadrant 12/25/2015   Right-sided chest wall pain 12/25/2015   BPH with obstruction/lower urinary tract symptoms 12/27/2014   Hypogonadism in male 12/27/2014   Erectile dysfunction of organic origin 12/27/2014   PCP:  Valera Castle, MD Pharmacy:   [facility] -  [place], [place] - [address] [address] Phone: [phone] Fax: [phone]  Readmission Risk Interventions No flowsheet data found.

## 2021-05-19 ENCOUNTER — Encounter (HOSPITAL_COMMUNITY): Payer: Self-pay | Admitting: Orthopedic Surgery

## 2021-05-19 DIAGNOSIS — D649 Anemia, unspecified: Secondary | ICD-10-CM | POA: Diagnosis present

## 2021-05-19 DIAGNOSIS — E441 Mild protein-calorie malnutrition: Secondary | ICD-10-CM | POA: Diagnosis present

## 2021-05-19 DIAGNOSIS — E66811 Obesity, class 1: Secondary | ICD-10-CM | POA: Diagnosis present

## 2021-05-19 DIAGNOSIS — T8149XA Infection following a procedure, other surgical site, initial encounter: Secondary | ICD-10-CM | POA: Diagnosis not present

## 2021-05-19 DIAGNOSIS — E119 Type 2 diabetes mellitus without complications: Secondary | ICD-10-CM

## 2021-05-19 DIAGNOSIS — E669 Obesity, unspecified: Secondary | ICD-10-CM

## 2021-05-19 HISTORY — DX: Type 2 diabetes mellitus without complications: E11.9

## 2021-05-19 HISTORY — DX: Obesity, class 1: E66.811

## 2021-05-19 HISTORY — DX: Obesity, unspecified: E66.9

## 2021-05-19 LAB — CREATININE, SERUM
Creatinine, Ser: 0.83 mg/dL (ref 0.61–1.24)
GFR, Estimated: >60 mL/min (ref >60)

## 2021-05-19 LAB — CBC
HCT: 34.6 % — ABNORMAL LOW (ref 39.0–52.0)
Hemoglobin: 11.3 g/dL — ABNORMAL LOW (ref 13.0–17.0)
MCH: 28.8 pg (ref 26.0–34.0)
MCHC: 32.7 g/dL (ref 30.0–36.0)
MCV: 88.3 fL (ref 80.0–100.0)
Platelets: 537 10*3/uL — ABNORMAL HIGH (ref 150–400)
RBC: 3.92 MIL/uL — ABNORMAL LOW (ref 4.22–5.81)
RDW: 14.6 % (ref 11.5–15.5)
WBC: 10.3 10*3/uL (ref 4.0–10.5)
nRBC: 0 % (ref 0.0–0.2)

## 2021-05-19 LAB — GLUCOSE, CAPILLARY
Glucose-Capillary: 201 mg/dL — ABNORMAL HIGH (ref 70–99)
Glucose-Capillary: 254 mg/dL — ABNORMAL HIGH (ref 70–99)
Glucose-Capillary: 264 mg/dL — ABNORMAL HIGH (ref 70–99)
Glucose-Capillary: 165 mg/dL — ABNORMAL HIGH (ref 70–99)

## 2021-05-19 LAB — SODIUM: Sodium: 134 mmol/L — ABNORMAL LOW (ref 135–145)

## 2021-05-19 LAB — POTASSIUM: Potassium: 3.5 mmol/L (ref 3.5–5.1)

## 2021-05-19 LAB — MAGNESIUM: Magnesium: 2.1 mg/dL (ref 1.7–2.4)

## 2021-05-19 MED ORDER — INSULIN ASPART 100 UNIT/ML IJ SOLN
0.0000 [IU] | Freq: Three times a day (TID) | INTRAMUSCULAR | Status: DC
  Administered 2021-05-19: 5 [IU] via SUBCUTANEOUS
  Administered 2021-05-19: 8 [IU] via SUBCUTANEOUS

## 2021-05-19 MED ORDER — METFORMIN HCL ER 500 MG PO TB24
500.0000 mg | ORAL_TABLET | Freq: Every day | ORAL | Status: DC
  Administered 2021-05-19 – 2021-05-21 (×3): 500 mg via ORAL
  Filled 2021-05-19 (×3): qty 1

## 2021-05-19 MED ORDER — POTASSIUM CHLORIDE CRYS ER 20 MEQ PO TBCR
40.0000 meq | EXTENDED_RELEASE_TABLET | Freq: Once | ORAL | Status: AC
  Administered 2021-05-19: 40 meq via ORAL
  Filled 2021-05-19: qty 2

## 2021-05-19 NOTE — Consult Note (Signed)
Initial Consultation Note   Patient: Micheal Silva LKG:[id number] DOB: [birth year] PCP: Valera Castle, MD DOA: 05/16/2021 DOS: the patient was seen and examined on 05/19/2021 Primary service: Rod Can, MD Referring physician: Susa Day, MD. Reason for consult: Diabetes management.  Assessment and Plan: Principal Problem:   Surgical site infection Continue postop care per orthopedic surgery. Continue IV antibiotics per infectious diseases.  Active Problems:   Type 2 diabetes mellitus (HCC) Lifestyle modifications. Resume metformin at a lower dose/XR form. Will begin with metformin XR 500 mg daily. Continue SS RI coverage while in the hospital. The patient and his wife are very committed to lifestyle changes.    Class 1 obesity Advised lifestyle modifications. Decrease simple sugars and total carbohydrate intake.    Normocytic anemia Monitor hematocrit and hemoglobin.    Mild protein malnutrition ([place]) In the setting of acute illness.    Hypogonadism in male Continue testosterone injections. Follow-up with PCP.  TRH will continue to follow the patient.  HPI: Micheal Silva is a 56 y.o. male with past medical history of anxiety, osteoarthritis, BPH, depression, type II DM, heart murmur, hypertension, hyperlipidemia, male hypogonadism, insomnia, class I obesity, right-sided chest wall pain, sleep apnea on CPAP who underwent right TKA on 03/28/2021, developed pain and erythema on 05/12/2021, started on Keflex and then admitted on 05/16/2021.  He had irrigation and debridement of the knee with poly exchange on 05/17/2021.  He was seen by ID yesterday who recommended to continue vancomycin and subparagraph so none PC plating 6 weeks of antibiotics followed by suppressive regimen pending surgical plan.  He has been having hyperglycemia and we are being consulted for diabetes control/medical management.  The patient was diagnosed with diabetes years ago.  He was taking  metformin twice daily, which had to be decreased in dosage because of weakness with lower blood glucose although not hypoglycemic.  His diet is not ideal since he stated that he likes to eat ice cream and cherry beer, which has at high content of sugar.  He has been having regular eye exams and denied having any numbness on the feet/hands.  Hemoglobin A1c was 6.9% 3 days ago.  He denied dyspnea, chest pain, diaphoresis, PND, bilateral pitting edema lower extremities, abdominal pain, nausea, emesis, diarrhea, constipation, melena or hematochezia.  No flank pain, dysuria, frequency or hematuria.  Review of Systems: As mentioned in the history of present illness. All other systems reviewed and are negative. Past Medical History:  Diagnosis Date   Anxiety    Arthritis    HANDS BIL   BPH (benign prostatic hyperplasia)    Depression    Diabetes mellitus ([place])    type 2   Difficult intubation    Heart murmur    HTN (hypertension)    Hyperlipidemia    Hypogonadism in male    Insomnia    Obesity    Right-sided chest wall pain 12/25/2015   Sleep apnea    CPAP   Past Surgical History:  Procedure Laterality Date   APPENDECTOMY  2006   Dr. Tamala Julian   CHOLECYSTECTOMY  2002   [facility]-Dr. Tamala Julian   COLONOSCOPY  01/01/2016   COLONOSCOPY WITH PROPOFOL N/A 01/01/2016   Procedure: COLONOSCOPY WITH PROPOFOL;  Surgeon: [name], MD;  Location: [facility] ENDOSCOPY;  Service: Endoscopy;  Laterality: N/A;   COLONOSCOPY WITH PROPOFOL N/A 02/26/2017   Procedure: COLONOSCOPY WITH PROPOFOL;  Surgeon: [name], MD;  Location: [facility] ENDOSCOPY;  Service: Endoscopy;  Laterality: N/A;  corrective surgery for sleep apnea  2005   ESOPHAGOGASTRODUODENOSCOPY (EGD) WITH PROPOFOL N/A 01/01/2016   Procedure: ESOPHAGOGASTRODUODENOSCOPY (EGD) WITH PROPOFOL;  Surgeon: [name], MD;  Location: [facility] ENDOSCOPY;  Service: Endoscopy;  Laterality: N/A;   I & D KNEE WITH POLY EXCHANGE Right 05/17/2021   Procedure:  IRRIGATION AND DEBRIDEMENT KNEE WITH POLY EXCHANGE;  Surgeon: Rod Can, MD;  Location: [facility] ORS;  Service: Orthopedics;  Laterality: Right;   KNEE ARTHROTOMY Right 10/23/2017   Procedure: RIGHT KNEE ARTHROTOMY PARTIAL MEDIAL MENISSECTOMY PATELLOFEMORAL CHONDROPLASTY;  Surgeon: Leim Fabry, MD;  Location: [place];  Service: Orthopedics;  Laterality: Right;  Diabetic - oral meds   LAPAROSCOPIC LYSIS OF ADHESIONS  01/25/2016   Procedure: LAPAROSCOPIC LYSIS OF ADHESIONS;  Surgeon: [name], MD;  Location: [facility] ORS;  Service: General;;   LAPAROSCOPY N/A 01/25/2016   Procedure: LAPAROSCOPY DIAGNOSTIC;  Surgeon: [name], MD;  Location: [facility] ORS;  Service: General;  Laterality: N/A;   TONSILLECTOMY  07/2003   palate also removed   TOTAL KNEE ARTHROPLASTY Right 03/28/2021   Procedure: TOTAL KNEE ARTHROPLASTY;  Surgeon: Sydnee Cabal, MD;  Location: [facility];  Service: Orthopedics;  Laterality: Right;  with adductor canal block 120   Social History:  reports that he has never smoked. He has never used smokeless tobacco. He reports that he does not currently use drugs. He reports that he does not drink alcohol.  Allergies  Allergen Reactions   Ativan [Lorazepam] Other (See Comments)    Increased agitation   Crestor [Rosuvastatin Calcium]     Muscle aches   Vicodin [Hydrocodone-Acetaminophen] Nausea And Vomiting   Atorvastatin     Other reaction(s): Leg cramp    Family History  Problem Relation Age of Onset   Prostate cancer Father    Leukemia Father    Diabetes Mellitus II Unknown    Coronary artery disease Unknown    Hypertension Unknown    Breast cancer Mother     Prior to Admission medications   Medication Sig Start Date End Date Taking? Authorizing Provider  acetaminophen (TYLENOL) 500 MG tablet Take 1,000 mg by mouth every 6 (six) hours as needed (pain).   Yes [provider]  buPROPion (WELLBUTRIN XL) 150 MG 24 hr tablet Take 150-300 mg  by mouth See admin instructions. Take 300 mg by mouth in the morning & take 150 mg by mouth in the evening. 09/09/16  Yes [provider]  cephALEXin (KEFLEX) 500 MG capsule Take 500 mg by mouth 4 (four) times daily. 7 day supply 05/14/21  Yes [provider]  cloNIDine (CATAPRES) 0.1 MG tablet Take 0.1 mg by mouth 2 (two) times daily. 12/09/16  Yes [provider]  ibuprofen (ADVIL) 200 MG tablet Take 600 mg by mouth every 6 (six) hours as needed (for pain.).   Yes [provider]  methocarbamol (ROBAXIN) 500 MG tablet Take 1 tablet (500 mg total) by mouth every 6 (six) hours as needed for muscle spasms. 03/29/21  Yes Haus, Margarita Mail R, PA  Multiple Vitamin (MULTIVITAMIN) tablet Take 1 tablet by mouth in the morning. Doterra - MitoMax   Yes [provider]  SUBOXONE 8-2 MG FILM Place 1 Film under the tongue in the morning and at bedtime. 01/13/17  Yes [provider]  testosterone cypionate (DEPOTESTOSTERONE CYPIONATE) 200 MG/ML injection Inject 200 mg into the muscle every 14 (fourteen) days. 03/07/21  Yes [provider]  aspirin 81 MG chewable tablet Chew 1 tablet (81  mg total) by mouth 2 (two) times daily. 05/18/21 07/02/21  Swinteck, Aaron Edelman, MD  bisacodyl (DULCOLAX) 5 MG EC tablet Take 1 tablet (5 mg total) by mouth daily as needed for moderate constipation. Patient not taking: Reported on 05/16/2021 03/29/21   Drue Novel, PA  docusate sodium (COLACE) 100 MG capsule Take 1 capsule (100 mg total) by mouth 2 (two) times daily. 05/18/21   Swinteck, Aaron Edelman, MD  gabapentin (NEURONTIN) 300 MG capsule Take 1 capsule (300 mg total) by mouth at bedtime. Patient not taking: Reported on 05/16/2021 03/29/21 04/28/21  Drue Novel, PA  ondansetron (ZOFRAN) 4 MG tablet Take 1 tablet (4 mg total) by mouth every 6 (six) hours as needed for nausea. Patient not taking: Reported on 05/16/2021 03/29/21   Drue Novel, PA  ondansetron (ZOFRAN) 4 MG tablet Take 1  tablet (4 mg total) by mouth every 6 (six) hours as needed for nausea. 05/18/21   Swinteck, Aaron Edelman, MD  oxyCODONE (OXY IR/ROXICODONE) 5 MG immediate release tablet Take 1-2 tablets (5-10 mg total) by mouth every 4 (four) hours as needed for moderate pain or severe pain (pain score 4-6). Patient not taking: Reported on 05/16/2021 03/29/21   Elizabeth Sauer R, PA  oxyCODONE (OXY IR/ROXICODONE) 5 MG immediate release tablet Take 1-2 tablets (5-10 mg total) by mouth every 4 (four) hours as needed for moderate pain (pain score 4-6). 05/18/21   Swinteck, Aaron Edelman, MD  polyethylene glycol (MIRALAX / GLYCOLAX) 17 g packet Take 17 g by mouth daily as needed for mild constipation. 05/18/21   Swinteck, Aaron Edelman, MD  senna (SENOKOT) 8.6 MG TABS tablet Take 2 tablets (17.2 mg total) by mouth at bedtime. 05/18/21   Swinteck, Aaron Edelman, MD  traMADol (ULTRAM) 50 MG tablet Take 1 tablet (50 mg total) by mouth every 6 (six) hours as needed for moderate pain. Patient not taking: Reported on 05/16/2021 03/29/21   Drue Novel, [place]    Physical Exam: Vitals:   05/18/21 1313 05/18/21 2033 05/19/21 0442 05/19/21 0800  BP: (!) 150/84 (!) 147/90 (!) 147/85 (!) 148/75  Pulse: 80 80 83 87  Resp: 16 18 17 18   Temp: 99.3 F (37.4 C) 98.6 F (37 C) 98.1 F (36.7 C) (!) 97.5 F (36.4 C)  TempSrc: Oral Oral Oral Oral  SpO2: 99% 99% 99% 99%  Weight:      Height:       Physical Exam Vitals and nursing note reviewed.  Constitutional:      Appearance: Normal appearance. He is obese.  HENT:     Head: Normocephalic and atraumatic.     Mouth/Throat:     Mouth: Mucous membranes are moist.  Eyes:     Extraocular Movements: Extraocular movements intact.     Pupils: Pupils are equal, round, and reactive to light.  Cardiovascular:     Rate and Rhythm: Normal rate and regular rhythm.  Pulmonary:     Effort: Pulmonary effort is normal.     Breath sounds: Normal breath sounds.  Abdominal:     General: There is no distension.      Palpations: Abdomen is soft.     Tenderness: There is no abdominal tenderness.  Musculoskeletal:     Cervical back: Neck supple.     Right knee: Swelling present.     Comments: Dressing in place.  Decreased right knee ROM.  Skin:    General: Skin is warm and dry.     Coloration: Skin is not jaundiced.  Neurological:  General: No focal deficit present.     Mental Status: He is alert.  Psychiatric:        Mood and Affect: Mood normal.        Behavior: Behavior normal.   Data Reviewed:   Results are pending, will review when available.  Family Communication:  Primary team communication: Susa Day, MD. Thank you very much for involving [place] in the care of your patient.  Author: Reubin Milan, MD 05/19/2021 8:55 AM  For on call review [email or web address].   This document was prepared using [employer] and may contain some unintended transcription errors.

## 2021-05-19 NOTE — Plan of Care (Signed)
  Problem: Health Behavior/Discharge Planning: Goal: Ability to manage health-related needs will improve Outcome: Progressing   

## 2021-05-19 NOTE — Progress Notes (Addendum)
Subjective: 2 Days Post-Op Procedure(s) (LRB): IRRIGATION AND DEBRIDEMENT KNEE WITH POLY EXCHANGE (Right) Patient reports pain as 5 on 0-10 scale.   Denies CP or SOB.  Voiding without difficulty. Positive flatus. Objective: Vital signs in last 24 hours: Temp:  [98.1 F (36.7 C)-99.3 F (37.4 C)] 98.1 F (36.7 C) (02/11 0442) Pulse Rate:  [80-83] 83 (02/11 0442) Resp:  [16-18] 17 (02/11 0442) BP: (147-150)/(84-90) 147/85 (02/11 0442) SpO2:  [99 %] 99 % (02/11 0442)  Intake/Output from previous day: 02/10 0701 - 02/11 0700 In: 3944.2 [P.O.:1540; I.V.:1954.2; IV Piggyback:450] Out: 2035 [Urine:3725; Drains:45] Intake/Output this shift: Total I/O In: -  Out: 600 [Urine:575; Drains:25]  Recent Labs    05/16/21 1214 05/16/21 1441 05/18/21 0318 05/19/21 0325  HGB 11.7* 11.4* 11.2* 11.3*   Recent Labs    05/18/21 0318 05/19/21 0325  WBC 10.9* 10.3  RBC 3.84* 3.92*  HCT 33.5* 34.6*  PLT 408* 537*   Recent Labs    05/16/21 1443 05/18/21 0318 05/19/21 0325  NA 130* 128*  --   K 3.2* 4.1  --   CL 94* 94*  --   CO2 26 23  --   BUN 10 11  --   CREATININE 0.92 0.70 0.83  GLUCOSE 350* 266*  --   CALCIUM 8.8* 8.0*  --    Recent Labs    05/16/21 1443  INR 1.2    Neurologically intact Neurovascular intact Sensation intact distally Intact pulses distally Dorsiflexion/Plantar flexion intact Incision: dressing C/D/I  Assessment/Plan:  2 Days Post-Op Procedure(s) (LRB): IRRIGATION AND DEBRIDEMENT KNEE WITH POLY EXCHANGE (Right) Advance diet Continue ABX therapy due to Post-op infection HV drainage 25cc. Will d/c per instructions Glucose up on SS.  Will need D/C meds recs. Consult Medicine for diabetes management. Spoke with Hospitalist. Consult pending    Principal Problem:   Surgical site infection      Johnn Hai 05/19/2021, @NOW 

## 2021-05-19 NOTE — Progress Notes (Signed)
Physical Therapy Treatment Patient Details Name: Micheal Silva MRN: [id number] DOB: November 21, 1965 Today's Date: 05/19/2021   History of Present Illness 56yo male s/p R TKA I&D with poly exchange, PMH: R TKR 03/28/21, opiate abuse, OA, DM, HTN, depression, obesity, sleep apnea    PT Comments    PT deferred this am 2* pt pain level but this pm pt up to ambulate in hall with limited assist, decreased pain and increased distance tolerated.   Recommendations for follow up therapy are one component of a multi-disciplinary discharge planning process, led by the attending physician.  Recommendations may be updated based on patient status, additional functional criteria and insurance authorization.  Follow Up Recommendations  Follow physician's recommendations for discharge plan and follow up therapies     Assistance Recommended at Discharge Intermittent Supervision/Assistance  Patient can return home with the following A little help with walking and/or transfers;A little help with bathing/dressing/bathroom;Assistance with cooking/housework;Assist for transportation;Help with stairs or ramp for entrance   Equipment Recommendations  Rolling walker (2 wheels)    Recommendations for Other Services       Precautions / Restrictions Precautions Precautions: Fall;Knee;Other (comment) Precaution Comments: RN contacted Dr Lyla Glassing 05/18/21 for clarification and instructed NO ROM at this time Required Braces or Orthoses: Knee Immobilizer - Right Knee Immobilizer - Right: On when out of bed or walking Restrictions Weight Bearing Restrictions: No RLE Weight Bearing: Weight bearing as tolerated Other Position/Activity Restrictions: KI in place for WB/ambulation     Mobility  Bed Mobility Overal bed mobility: Needs Assistance Bed Mobility: Supine to Sit     Supine to sit: Min guard     General bed mobility comments: min quard for R LE    Transfers Overall transfer level: Needs  assistance Equipment used: Rolling walker (2 wheels) Transfers: Sit to/from Stand Sit to Stand: Min guard           General transfer comment: cues for LE management and use of UEs to self assist    Ambulation/Gait Ambulation/Gait assistance: Min guard, Supervision Gait Distance (Feet): 200 Feet Assistive device: Rolling walker (2 wheels) Gait Pattern/deviations: Step-to pattern, Decreased stance time - right, Decreased weight shift to right Gait velocity: decreased     General Gait Details: min cues for posture, position from RW and sequence   Stairs             Wheelchair Mobility    Modified Rankin (Stroke Patients Only)       Balance Overall balance assessment: Mild deficits observed, not formally tested                                          Cognition Arousal/Alertness: Awake/alert Behavior During Therapy: WFL for tasks assessed/performed Overall Cognitive Status: Within Functional Limits for tasks assessed                                          Exercises      General Comments        Pertinent Vitals/Pain Pain Assessment Pain Assessment: 0-10 Pain Score: 5  Pain Location: right knee Pain Descriptors / Indicators: Aching, Sore Pain Intervention(s): Limited activity within patient's tolerance, Monitored during session, Premedicated before session, Ice applied    Home Living  Prior Function            PT Goals (current goals can now be found in the care plan section) Acute Rehab PT Goals Patient Stated Goal: home PT Goal Formulation: With patient Time For Goal Achievement: 05/26/21 Potential to Achieve Goals: Good Progress towards PT goals: Progressing toward goals    Frequency    7X/week      PT Plan Current plan remains appropriate    Co-evaluation              AM-PAC PT "6 Clicks" Mobility   Outcome Measure  Help needed turning from your back  to your side while in a flat bed without using bedrails?: A Little Help needed moving from lying on your back to sitting on the side of a flat bed without using bedrails?: A Little Help needed moving to and from a bed to a chair (including a wheelchair)?: A Little Help needed standing up from a chair using your arms (e.g., wheelchair or bedside chair)?: A Little Help needed to walk in hospital room?: A Little Help needed climbing 3-5 steps with a railing? : A Little 6 Click Score: 18    End of Session Equipment Utilized During Treatment: Gait belt Activity Tolerance: Patient tolerated treatment well Patient left: in chair;with call bell/phone within reach;with family/visitor present Nurse Communication: Mobility status PT Visit Diagnosis: Other abnormalities of gait and mobility (R26.89);Difficulty in walking, not elsewhere classified (R26.2)     Time: 4944-9675 PT Time Calculation (min) (ACUTE ONLY): 27 min  Charges:  $Gait Training: 23-37 mins                     [place] Pager [phone] Office [phone]    Candance Bohlman 05/19/2021, 5:03 PM

## 2021-05-20 DIAGNOSIS — E441 Mild protein-calorie malnutrition: Secondary | ICD-10-CM | POA: Diagnosis not present

## 2021-05-20 DIAGNOSIS — E1169 Type 2 diabetes mellitus with other specified complication: Secondary | ICD-10-CM

## 2021-05-20 DIAGNOSIS — D649 Anemia, unspecified: Secondary | ICD-10-CM

## 2021-05-20 DIAGNOSIS — E669 Obesity, unspecified: Secondary | ICD-10-CM

## 2021-05-20 DIAGNOSIS — T8149XA Infection following a procedure, other surgical site, initial encounter: Secondary | ICD-10-CM | POA: Diagnosis not present

## 2021-05-20 LAB — CBC
HCT: 34.4 % — ABNORMAL LOW (ref 39.0–52.0)
Hemoglobin: 10.9 g/dL — ABNORMAL LOW (ref 13.0–17.0)
MCH: 28.4 pg (ref 26.0–34.0)
MCHC: 31.7 g/dL (ref 30.0–36.0)
MCV: 89.6 fL (ref 80.0–100.0)
Platelets: 560 10*3/uL — ABNORMAL HIGH (ref 150–400)
RBC: 3.84 MIL/uL — ABNORMAL LOW (ref 4.22–5.81)
RDW: 14.8 % (ref 11.5–15.5)
WBC: 9.5 10*3/uL (ref 4.0–10.5)
nRBC: 0 % (ref 0.0–0.2)

## 2021-05-20 LAB — GLUCOSE, CAPILLARY
Glucose-Capillary: 209 mg/dL — ABNORMAL HIGH (ref 70–99)
Glucose-Capillary: 242 mg/dL — ABNORMAL HIGH (ref 70–99)
Glucose-Capillary: 104 mg/dL — ABNORMAL HIGH (ref 70–99)
Glucose-Capillary: 131 mg/dL — ABNORMAL HIGH (ref 70–99)

## 2021-05-20 LAB — BASIC METABOLIC PANEL
Anion gap: 8 (ref 5–15)
BUN: 14 mg/dL (ref 6–20)
CO2: 27 mmol/L (ref 22–32)
Calcium: 8.5 mg/dL — ABNORMAL LOW (ref 8.9–10.3)
Chloride: 100 mmol/L (ref 98–111)
Creatinine, Ser: 0.82 mg/dL (ref 0.61–1.24)
GFR, Estimated: >60 mL/min (ref >60)
Glucose, Bld: 222 mg/dL — ABNORMAL HIGH (ref 70–99)
Potassium: 3.8 mmol/L (ref 3.5–5.1)
Sodium: 135 mmol/L (ref 135–145)

## 2021-05-20 MED ORDER — INSULIN ASPART 100 UNIT/ML IJ SOLN
4.0000 [IU] | Freq: Three times a day (TID) | INTRAMUSCULAR | Status: DC
  Administered 2021-05-20 – 2021-05-21 (×4): 4 [IU] via SUBCUTANEOUS

## 2021-05-20 MED ORDER — INSULIN ASPART 100 UNIT/ML IJ SOLN
0.0000 [IU] | Freq: Three times a day (TID) | INTRAMUSCULAR | Status: DC
  Administered 2021-05-20 (×2): 5 [IU] via SUBCUTANEOUS
  Administered 2021-05-21: 2 [IU] via SUBCUTANEOUS
  Administered 2021-05-21: 5 [IU] via SUBCUTANEOUS

## 2021-05-20 MED ORDER — INSULIN ASPART 100 UNIT/ML IJ SOLN: 0.0000 [IU] | Freq: Every day | INTRAMUSCULAR | Status: DC

## 2021-05-20 MED ORDER — INSULIN DETEMIR 100 UNIT/ML ~~LOC~~ SOLN
5.0000 [IU] | Freq: Two times a day (BID) | SUBCUTANEOUS | Status: DC
  Administered 2021-05-20 – 2021-05-21 (×3): 5 [IU] via SUBCUTANEOUS
  Filled 2021-05-20 (×4): qty 0.05

## 2021-05-20 NOTE — Plan of Care (Signed)
  Problem: Health Behavior/Discharge Planning: Goal: Ability to manage health-related needs will improve Outcome: Progressing   

## 2021-05-20 NOTE — Progress Notes (Signed)
Physical Therapy Treatment Patient Details Name: Micheal Silva MRN: [id number] DOB: November 20, 1965 Today's Date: 05/20/2021   History of Present Illness 56yo male s/p R TKA I&D with poly exchange, PMH: R TKR 03/28/21, opiate abuse, OA, DM, HTN, depression, obesity, sleep apnea    PT Comments    Pt continues very cooperative and progressing well with mobility.  Pt up to ambulate in hall with noted increased ease and speed of movement and up to bathroom for toileting.  Will follow up with stair training.  Recommendations for follow up therapy are one component of a multi-disciplinary discharge planning process, led by the attending physician.  Recommendations may be updated based on patient status, additional functional criteria and insurance authorization.  Follow Up Recommendations  Follow physician's recommendations for discharge plan and follow up therapies     Assistance Recommended at Discharge Intermittent Supervision/Assistance  Patient can return home with the following A little help with walking and/or transfers;A little help with bathing/dressing/bathroom;Assistance with cooking/housework;Assist for transportation;Help with stairs or ramp for entrance   Equipment Recommendations  Rolling walker (2 wheels)    Recommendations for Other Services       Precautions / Restrictions Precautions Precautions: Fall;Knee;Other (comment) Precaution Comments: RN contacted Dr Lyla Glassing 05/18/21 for clarification and instructed NO ROM at this time Required Braces or Orthoses: Knee Immobilizer - Right Knee Immobilizer - Right: On when out of bed or walking Restrictions Weight Bearing Restrictions: No RLE Weight Bearing: Weight bearing as tolerated Other Position/Activity Restrictions: KI in place for WB/ambulation     Mobility  Bed Mobility Overal bed mobility: Needs Assistance Bed Mobility: Supine to Sit     Supine to sit: Supervision     General bed mobility comments: no physical  assist    Transfers Overall transfer level: Needs assistance Equipment used: Rolling walker (2 wheels) Transfers: Sit to/from Stand Sit to Stand: Supervision           General transfer comment: cues for LE management and use of UEs to self assist    Ambulation/Gait Ambulation/Gait assistance: Min guard, Supervision Gait Distance (Feet): 200 Feet Assistive device: Rolling walker (2 wheels) Gait Pattern/deviations: Step-to pattern, Decreased stance time - right, Decreased weight shift to right Gait velocity: decreased     General Gait Details: min cues for posture, position from RW and sequence   Stairs             Wheelchair Mobility    Modified Rankin (Stroke Patients Only)       Balance Overall balance assessment: Mild deficits observed, not formally tested                                          Cognition Arousal/Alertness: Awake/alert Behavior During Therapy: WFL for tasks assessed/performed Overall Cognitive Status: Within Functional Limits for tasks assessed                                 General Comments: AxO x 3        Exercises      General Comments        Pertinent Vitals/Pain Pain Assessment Pain Assessment: 0-10 Pain Score: 6  Pain Location: right knee Pain Descriptors / Indicators: Aching, Sore Pain Intervention(s): Limited activity within patient's tolerance, Monitored during session, Premedicated before session    Home Living  Prior Function            PT Goals (current goals can now be found in the care plan section) Acute Rehab PT Goals Patient Stated Goal: home PT Goal Formulation: With patient Time For Goal Achievement: 05/26/21 Potential to Achieve Goals: Good Progress towards PT goals: Progressing toward goals    Frequency    7X/week      PT Plan Current plan remains appropriate    Co-evaluation              AM-PAC PT "6  Clicks" Mobility   Outcome Measure  Help needed turning from your back to your side while in a flat bed without using bedrails?: A Little Help needed moving from lying on your back to sitting on the side of a flat bed without using bedrails?: A Little Help needed moving to and from a bed to a chair (including a wheelchair)?: A Little Help needed standing up from a chair using your arms (e.g., wheelchair or bedside chair)?: A Little Help needed to walk in hospital room?: A Little Help needed climbing 3-5 steps with a railing? : A Little 6 Click Score: 18    End of Session Equipment Utilized During Treatment: Gait belt Activity Tolerance: Patient tolerated treatment well Patient left: in chair;with call bell/phone within reach Nurse Communication: Mobility status PT Visit Diagnosis: Other abnormalities of gait and mobility (R26.89);Difficulty in walking, not elsewhere classified (R26.2)     Time: 8366-2947 PT Time Calculation (min) (ACUTE ONLY): 18 min  Charges:  $Gait Training: 8-22 mins                     [place] Pager [phone] Office [phone]    Shalen Petrak 05/20/2021, 12:28 PM

## 2021-05-20 NOTE — Progress Notes (Signed)
[facility] CONSULT PROGRESS NOTE    Progress Note  Micheal Silva  ERX:[id number] DOB: 06/09/1965 DOA: 05/16/2021 PCP: Valera Castle, MD     Brief Narrative:   Micheal Silva is an 56 y.o. male past medical history of anxiety osteoarthritis diabetes mellitus type 2 essential hypertension male hypogonadism obesity sleep apnea who underwent a total knee replacement on 03/28/2021 subsequently started developing pain and erythema on 05/12/2021 started on Keflex 10 admitted on 05/16/2021 for irrigation and debridement of the knee he was seen by ID yesterday who recommended IV vancomycin and Rocephin which they are planning for 6 weeks of antibiotics followed by suppressive regimen he has been hyperglycemic and hard to control blood glucose, for which she was recently diagnosed about a year ago.  His hemoglobin A1c is 6.9    Assessment/Plan:  Surgical site infection: Postop care per orthopedic surgery. Antibiotics per infectious disease currently on IV vancomycin and cefepime.  Diabetes mellitus type 2: A1c of 6.9 he was restarted back on his metformin. He was placed on sliding scale insulin, blood glucose has been erratic, will increase his sliding scale insulin.  Obesity: Counseling.  Normocytic anemia: Hemoglobin has remained relatively stable continue to monitor intermittently.  Thrombocytosis: Acting as an acute phase reactant acutely trending up.  Mild protein caloric malnutrition: Likely due to illness.  Hypogonadism in male Follow-up with PCP.    DVT prophylaxis: lovenox Family Communication:none Status is: Inpatient Remains inpatient appropriate because: Surgical site infection, probably have to go home with IV antibiotics       Code Status:     Code Status Orders  (From admission, onward)           Start     Ordered   05/16/21 1321  Full code  Continuous        05/16/21 1320           Code Status History     Date Active Date Inactive  Code Status Order ID Comments User Context   03/28/2021 1051 03/29/2021 1822 Full Code [id number]  Drue Novel, [place] Inpatient   06/12/2016 0135 06/14/2016 1721 Full Code [id number]  Hugelmeyer, Ubaldo Glassing, DO Inpatient      Advance Directive Documentation    Flowsheet Row Most Recent Value  Type of Advance Directive Healthcare Power of Attorney, Living will  Pre-existing out of facility DNR order (yellow form or pink MOST form) --  "MOST" Form in Place? --         IV Access:   Peripheral IV   Procedures and diagnostic studies:   No results found.   Medical Consultants:   None.   Subjective:    Micheal Silva no complaints this morning tolerated his diet.  Objective:    Vitals:   05/19/21 0800 05/19/21 1346 05/19/21 2145 05/20/21 0549  BP: (!) 148/75 (!) 158/83 (!) 149/76 (!) 147/86  Pulse: 87 85 72 77  Resp: 18 16 17 17   Temp: (!) 97.5 F (36.4 C) 98.3 F (36.8 C) 98.9 F (37.2 C) 98.6 F (37 C)  TempSrc: Oral Oral Oral Oral  SpO2: 99% 100% 100% 100%  Weight:      Height:       SpO2: 100 % O2 Flow Rate (L/min): 8 L/min   Intake/Output Summary (Last 24 hours) at 05/20/2021 0715 Last data filed at 05/20/2021 0600 Gross per 24 hour  Intake 3728.42 ml  Output 4310 ml  Net -581.58 ml   [employer]   05/16/21  1457  Weight: 92 kg    Exam: General exam: In no acute distress. Respiratory system: Good air movement and clear to auscultation. Cardiovascular system: S1 & S2 heard, RRR. No JVD. Gastrointestinal system: Abdomen is nondistended, soft and nontender.  Extremities: No pedal edema. Skin: No rashes, lesions or ulcers Psychiatry: Judgement and insight appear normal. Mood & affect appropriate.    Data Reviewed:    Labs: Basic Metabolic Panel: Recent Labs  Lab 05/16/21 1214 05/16/21 1443 05/18/21 0318 05/19/21 0325 05/19/21 0852 05/20/21 0329  NA 129* 130* 128*  --  134* 135  K 3.5 3.2* 4.1  --  3.5 3.8  CL 94* 94* 94*  --   --  100   CO2 27 26 23   --   --  27  GLUCOSE 327* 350* 266*  --   --  222*  BUN 10 10 11   --   --  14  CREATININE 0.82 0.92 0.70 0.83  --  0.82  CALCIUM 8.6* 8.8* 8.0*  --   --  8.5*  MG  --   --   --   --  2.1  --    GFR Estimated Creatinine Clearance: 112 mL/min (by C-G formula based on SCr of 0.82 mg/dL). Liver Function Tests: Recent Labs  Lab 05/16/21 1443  AST 22  ALT 22  ALKPHOS 90  BILITOT 0.4  PROT 7.4  ALBUMIN 3.3*   No results for input(s): LIPASE, AMYLASE in the last 168 hours. No results for input(s): AMMONIA in the last 168 hours. Coagulation profile Recent Labs  Lab 05/16/21 1443  INR 1.2   COVID-19 Labs  No results for input(s): DDIMER, FERRITIN, LDH, CRP in the last 72 hours.  Lab Results  Component Value Date   [place] NEGATIVE 05/16/2021    CBC: Recent Labs  Lab 05/16/21 1214 05/16/21 1441 05/18/21 0318 05/19/21 0325 05/20/21 0329  WBC 8.5 8.0 10.9* 10.3 9.5  NEUTROABS 6.7  --   --   --   --   HGB 11.7* 11.4* 11.2* 11.3* 10.9*  HCT 35.0* 34.6* 33.5* 34.6* 34.4*  MCV 87.3 88.7 87.2 88.3 89.6  PLT 357 369 408* 537* 560*   Cardiac Enzymes: No results for input(s): CKTOTAL, CKMB, CKMBINDEX, TROPONINI in the last 168 hours. BNP (last 3 results) No results for input(s): PROBNP in the last 8760 hours. CBG: Recent Labs  Lab 05/18/21 2035 05/19/21 0733 05/19/21 1114 05/19/21 1742 05/19/21 2147  GLUCAP 255* 201* 254* 264* 165*   D-Dimer: No results for input(s): DDIMER in the last 72 hours. Hgb A1c: No results for input(s): HGBA1C in the last 72 hours. Lipid Profile: No results for input(s): CHOL, HDL, LDLCALC, TRIG, CHOLHDL, LDLDIRECT in the last 72 hours. Thyroid function studies: No results for input(s): TSH, T4TOTAL, T3FREE, THYROIDAB in the last 72 hours.  Invalid input(s): FREET3 Anemia work up: No results for input(s): VITAMINB12, FOLATE, FERRITIN, TIBC, IRON, RETICCTPCT in the last 72 hours. Sepsis Labs: Recent Labs  Lab  05/16/21 1441 05/18/21 0318 05/19/21 0325 05/20/21 0329  WBC 8.0 10.9* 10.3 9.5   Microbiology Recent Results (from the past 240 hour(s))  Resp Panel by RT-PCR (Flu A&B, Covid) Nasopharyngeal Swab     Status: None   Collection Time: 05/16/21 12:14 PM   Specimen: Nasopharyngeal Swab; Nasopharyngeal(NP) swabs in vial transport medium  Result Value Ref Range Status   SARS Coronavirus 2 by RT PCR NEGATIVE NEGATIVE Final    Comment: (NOTE) SARS-CoV-2 target nucleic acids are  NOT DETECTED.  The SARS-CoV-2 RNA is generally detectable in upper respiratory specimens during the acute phase of infection. The lowest concentration of SARS-CoV-2 viral copies this assay can detect is 138 copies/mL. A negative result does not preclude SARS-Cov-2 infection and should not be used as the sole basis for treatment or other patient management decisions. A negative result may occur with  improper specimen collection/handling, submission of specimen other than nasopharyngeal swab, presence of viral mutation(s) within the areas targeted by this assay, and inadequate number of viral copies(<138 copies/mL). A negative result must be combined with clinical observations, patient history, and epidemiological information. The expected result is Negative.  Fact Sheet for Patients:  EntrepreneurPulse.com.au  Fact Sheet for Healthcare Providers:  IncredibleEmployment.be  This test is no t yet approved or cleared by the [place] FDA and  has been authorized for detection and/or diagnosis of SARS-CoV-2 by FDA under an Emergency Use Authorization (EUA). This EUA will remain  in effect (meaning this test can be used) for the duration of the COVID-19 declaration under Section 564(b)(1) of the Act, 21 U.S.C.section 360bbb-3(b)(1), unless the authorization is terminated  or revoked sooner.       Influenza A by PCR NEGATIVE NEGATIVE Final   Influenza B by PCR NEGATIVE  NEGATIVE Final    Comment: (NOTE) The Xpert Xpress SARS-CoV-2/FLU/RSV plus assay is intended as an aid in the diagnosis of influenza from Nasopharyngeal swab specimens and should not be used as a sole basis for treatment. Nasal washings and aspirates are unacceptable for Xpert Xpress SARS-CoV-2/FLU/RSV testing.  Fact Sheet for Patients: EntrepreneurPulse.com.au  Fact Sheet for Healthcare Providers: IncredibleEmployment.be  This test is not yet approved or cleared by the [place] FDA and has been authorized for detection and/or diagnosis of SARS-CoV-2 by FDA under an Emergency Use Authorization (EUA). This EUA will remain in effect (meaning this test can be used) for the duration of the COVID-19 declaration under Section 564(b)(1) of the Act, 21 U.S.C. section 360bbb-3(b)(1), unless the authorization is terminated or revoked.  Performed at [facility], [place] [address] [place], [place] [address]   Surgical PCR screen     Status: None   Collection Time: 05/16/21  1:21 PM   Specimen: Nasal Mucosa; Nasal Swab  Result Value Ref Range Status   MRSA, PCR NEGATIVE NEGATIVE Final   Staphylococcus aureus NEGATIVE NEGATIVE Final    Comment: (NOTE) The Xpert SA Assay (FDA approved for NASAL specimens in patients 46 years of age and older), is one component of a comprehensive surveillance program. It is not intended to diagnose infection nor to guide or monitor treatment. Performed at [facility], [place] [address], [place], [place] [address]   Aerobic/Anaerobic Culture w Gram Stain (surgical/deep wound)     Status: None (Preliminary result)   Collection Time: 05/17/21  2:07 PM   Specimen: PATH Cytology Misc. fluid; Body Fluid  Result Value Ref Range Status   Specimen Description   Final    KNEE RIGHT SYNOVIUM Performed at [place] [address], [place], [place] [address]    Special  Requests   Final    NONE Performed at [facility], [place] [address] [place], [place] [address]    Gram Stain   Final    NO SQUAMOUS EPITHELIAL CELLS SEEN MODERATE WBC SEEN NO ORGANISMS SEEN    Culture   Final    CULTURE REINCUBATED FOR BETTER GROWTH Performed at [facility],  [address] [place], [place] [address]    Report Status PENDING  Incomplete  Aerobic/Anaerobic Culture w Gram Stain (surgical/deep wound)     Status: None (Preliminary result)   Collection Time: 05/17/21  3:34 PM   Specimen: PATH Soft tissue  Result Value Ref Range Status   Specimen Description   Final    KNEE RIGHT Performed at [facility], [place] [address] [place], [place] [address]    Special Requests   Final    NONE Performed at [facility], [place] [address], [place], [place] [address]    Gram Stain   Final    NO SQUAMOUS EPITHELIAL CELLS SEEN MODERATE WBC SEEN NO ORGANISMS SEEN    Culture   Final    CULTURE REINCUBATED FOR BETTER GROWTH Performed at [facility], [address], [place], [place] [address]    Report Status PENDING  Incomplete     Medications:    aspirin  81 mg Oral BID   buprenorphine-naloxone  1 tablet Sublingual Daily   buPROPion  300 mg Oral Daily   And   buPROPion  150 mg Oral QHS   Chlorhexidine Gluconate Cloth  6 each Topical Daily   cloNIDine  0.1 mg Oral BID   docusate sodium  100 mg Oral BID   gabapentin  300 mg Oral QHS   insulin aspart  0-15 Units Subcutaneous TID WC   metFORMIN  500 mg Oral Q breakfast   senna  1 tablet Oral BID   Continuous Infusions:  sodium chloride 100 mL/hr at 05/18/21 1556   cefTRIAXone (ROCEPHIN)  IV 2 g (05/19/21 2051)   methocarbamol (ROBAXIN) IV     methocarbamol (ROBAXIN) IV Stopped (05/17/21 1940)   vancomycin 1,750 mg (05/20/21 0327)      LOS: 4 days   Charlynne Cousins  [facility]  05/20/2021, 7:15 AM

## 2021-05-20 NOTE — Progress Notes (Signed)
ID Brief Note    Component 3 d ago  Specimen Description KNEE RIGHT  Performed at [place] [facility] [address], [place], [place] [address]   Special Requests NONE  Performed at [facility], [place] [address], [place], [place] [address]   Gram Stain NO SQUAMOUS EPITHELIAL CELLS SEEN  MODERATE WBC SEEN  NO ORGANISMS SEEN  Performed at [facility], [place] [address], [place], [place] [address]   Culture RARE STREPTOCOCCUS MITIS/ORALIS  CULTURE REINCUBATED FOR BETTER GROWTH  NO ANAEROBES ISOLATED; CULTURE IN PROGRESS FOR 5 DAYS   Report Status PENDING     Recommendations DC Vancomycin Continue ceftriaxone  Dr Linus Salmons to follow from Monday  Rosiland Oz, MD Infectious Disease Physician [facility] [address] [place], [place] [address] Phone: [phone]   Fax: [phone]

## 2021-05-20 NOTE — Progress Notes (Signed)
Pharmacy Antibiotic Note  Micheal Silva is a 56 y.o. male admitted on 05/16/2021 with prosthetic joint infection.  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  Pharmacy has been consulted for Vancomycin dosing.   Plan: Ceftriaxone 2g IV q24h per MD Continue vancomycin 1750 mg IV q24h  Measure Vanc peak and trough as needed.  Goal AUC = 400 - 550. Follow up renal function, culture results, and clinical course.   Height: 5\' 8"  (172.7 cm) Weight: 92 kg (202 lb 13.2 oz) IBW/kg (Calculated) : 68.4  Temp (24hrs), Avg:98.6 F (37 C), Min:98.3 F (36.8 C), Max:98.9 F (37.2 C)  Recent Labs  Lab 05/16/21 1214 05/16/21 1441 05/16/21 1443 05/18/21 0318 05/19/21 0325 05/20/21 0329  WBC 8.5 8.0  --  10.9* 10.3 9.5  CREATININE 0.82  --  0.92 0.70 0.83 0.82     Estimated Creatinine Clearance: 112 mL/min (by C-G formula based on SCr of 0.82 mg/dL).    Allergies  Allergen Reactions   Ativan [Lorazepam] Other (See Comments)    Increased agitation   Crestor [Rosuvastatin Calcium]     Muscle aches   Vicodin [Hydrocodone-Acetaminophen] Nausea And Vomiting   Atorvastatin     Other reaction(s): Leg cramp    Thank you for allowing pharmacy to be a part of this patients care.  [name], PharmD  05/20/2021 12:14 PM

## 2021-05-20 NOTE — Progress Notes (Signed)
° °  Subjective: 3 Days Post-Op Procedure(s) (LRB): IRRIGATION AND DEBRIDEMENT KNEE WITH POLY EXCHANGE (Right)  Pt c/o mild to moderate soreness in the right knee this morning Denies any new symptoms or issues  Somewhat discouraged about the infection and using the wound vac Patient reports pain as moderate  Objective:   VITALS:   Vitals:   05/19/21 2145 05/20/21 0549  BP: (!) 149/76 (!) 147/86  Pulse: 72 77  Resp: 17 17  Temp: 98.9 F (37.2 C) 98.6 F (37 C)  SpO2: 100% 100%    Right knee dressing and JP drain intact Nv intact distally No rashes or edema distally  Limited rom due to guarding and pain  LABS Recent Labs    05/18/21 0318 05/19/21 0325 05/20/21 0329  HGB 11.2* 11.3* 10.9*  HCT 33.5* 34.6* 34.4*  WBC 10.9* 10.3 9.5  PLT 408* 537* 560*    Recent Labs    05/18/21 0318 05/19/21 0325 05/19/21 0852 05/20/21 0329  NA 128*  --  134* 135  K 4.1  --  3.5 3.8  BUN 11  --   --  14  CREATININE 0.70 0.83  --  0.82  GLUCOSE 266*  --   --  222*     Assessment/Plan: 3 Days Post-Op Procedure(s) (LRB): IRRIGATION AND DEBRIDEMENT KNEE WITH POLY EXCHANGE (Right) Continue IV antibiotics Appreciate medical team aiding with his care Plan for hopeful d/c tomorrow Pain management as needed Continue PT/OT   Kathrynn Speed, [facility] is now [facility]   [place] [address], [place], [place], [place] [address] Phone: [phone] [email or web address]   [employer]

## 2021-05-20 NOTE — Progress Notes (Signed)
Physical Therapy Treatment Patient Details Name: Micheal Silva MRN: [id number] DOB: [birth year] Today's Date: 05/20/2021   History of Present Illness 56yo male s/p R TKA I&D with poly exchange, PMH: R TKR 03/28/21, opiate abuse, OA, DM, HTN, depression, obesity, sleep apnea    PT Comments    Pt continues to progress well with mobility and performing bed mobility tasks unassisted,  ambulating in hall  with distant supervision and negotiating stairs with cues and CGA.  Pt hopeful for dc home tomorrow.  Recommendations for follow up therapy are one component of a multi-disciplinary discharge planning process, led by the attending physician.  Recommendations may be updated based on patient status, additional functional criteria and insurance authorization.  Follow Up Recommendations  Follow physician's recommendations for discharge plan and follow up therapies     Assistance Recommended at Discharge Intermittent Supervision/Assistance  Patient can return home with the following A little help with bathing/dressing/bathroom;Assistance with cooking/housework;Assist for transportation;Help with stairs or ramp for entrance   Equipment Recommendations  Rolling walker (2 wheels)    Recommendations for Other Services       Precautions / Restrictions Precautions Precautions: Fall;Knee;Other (comment) Precaution Comments: RN contacted Dr Lyla Glassing 05/18/21 for clarification and instructed NO ROM at this time Required Braces or Orthoses: Knee Immobilizer - Right Knee Immobilizer - Right: On when out of bed or walking Restrictions Weight Bearing Restrictions: No RLE Weight Bearing: Weight bearing as tolerated Other Position/Activity Restrictions: KI in place for WB/ambulation     Mobility  Bed Mobility Overal bed mobility: Modified Independent Bed Mobility: Supine to Sit     Supine to sit: Supervision     General bed mobility comments: no physical assist    Transfers Overall transfer  level: Needs assistance Equipment used: Rolling walker (2 wheels) Transfers: Sit to/from Stand Sit to Stand: Supervision           General transfer comment: min cues for LE use of UEs to self assist    Ambulation/Gait Ambulation/Gait assistance: Supervision Gait Distance (Feet): 250 Feet Assistive device: Rolling walker (2 wheels) Gait Pattern/deviations: Step-to pattern, Decreased stance time - right, Decreased weight shift to right Gait velocity: decreased     General Gait Details: pt self-cues for posture, position from RW and sequence   Stairs Stairs: Yes Stairs assistance: Supervision, Min guard Stair Management: No rails, Step to pattern, Forwards, With walker Number of Stairs: 3 General stair comments: single step - twice bkwd and once fwd   Wheelchair Mobility    Modified Rankin (Stroke Patients Only)       Balance Overall balance assessment: Mild deficits observed, not formally tested                                          Cognition Arousal/Alertness: Awake/alert Behavior During Therapy: WFL for tasks assessed/performed Overall Cognitive Status: Within Functional Limits for tasks assessed                                 General Comments: AxO x 3        Exercises      General Comments        Pertinent Vitals/Pain Pain Assessment Pain Assessment: 0-10 Pain Score: 6  Pain Location: right knee Pain Descriptors / Indicators: Aching, Sore Pain Intervention(s): Limited activity within patient's tolerance, Monitored  during session, Premedicated before session, Ice applied    Home Living                          Prior Function            PT Goals (current goals can now be found in the care plan section) Acute Rehab PT Goals Patient Stated Goal: home PT Goal Formulation: With patient Time For Goal Achievement: 05/26/21 Potential to Achieve Goals: Good Progress towards PT goals: Progressing  toward goals    Frequency    7X/week      PT Plan Current plan remains appropriate    Co-evaluation              AM-PAC PT "6 Clicks" Mobility   Outcome Measure  Help needed turning from your back to your side while in a flat bed without using bedrails?: None Help needed moving from lying on your back to sitting on the side of a flat bed without using bedrails?: None Help needed moving to and from a bed to a chair (including a wheelchair)?: A Little Help needed standing up from a chair using your arms (e.g., wheelchair or bedside chair)?: A Little Help needed to walk in hospital room?: A Little Help needed climbing 3-5 steps with a railing? : A Little 6 Click Score: 20    End of Session Equipment Utilized During Treatment: Gait belt Activity Tolerance: Patient tolerated treatment well Patient left: in chair;with call bell/phone within reach;with family/visitor present Nurse Communication: Mobility status PT Visit Diagnosis: Other abnormalities of gait and mobility (R26.89);Difficulty in walking, not elsewhere classified (R26.2)     Time: 1636-1700 PT Time Calculation (min) (ACUTE ONLY): 24 min  Charges:  $Gait Training: 8-22 mins                     Debe Coder PT [place] Pager [phone] Office [phone]    [place] 05/20/2021, 5:20 PM

## 2021-05-21 ENCOUNTER — Inpatient Hospital Stay: Payer: Self-pay

## 2021-05-21 DIAGNOSIS — T8149XA Infection following a procedure, other surgical site, initial encounter: Secondary | ICD-10-CM

## 2021-05-21 LAB — GLUCOSE, CAPILLARY
Glucose-Capillary: 205 mg/dL — ABNORMAL HIGH (ref 70–99)
Glucose-Capillary: 133 mg/dL — ABNORMAL HIGH (ref 70–99)
Glucose-Capillary: 161 mg/dL — ABNORMAL HIGH (ref 70–99)

## 2021-05-21 LAB — CREATININE, SERUM
Creatinine, Ser: 0.9 mg/dL (ref 0.61–1.24)
GFR, Estimated: >60 mL/min (ref >60)

## 2021-05-21 MED ORDER — METFORMIN HCL ER 500 MG PO TB24: 500.0000 mg | ORAL_TABLET | Freq: Every day | ORAL | 0 refills | Status: AC

## 2021-05-21 MED ORDER — SODIUM CHLORIDE 0.9% FLUSH: 10.0000 mL | INTRAVENOUS | Status: DC | PRN

## 2021-05-21 MED ORDER — SODIUM CHLORIDE 0.9% FLUSH: 10.0000 mL | Freq: Two times a day (BID) | INTRAVENOUS | Status: DC

## 2021-05-21 MED ORDER — PENICILLIN G POTASSIUM IV (FOR PTA / DISCHARGE USE ONLY): 24.0000 10*6.[IU] | INTRAVENOUS | 0 refills | Status: AC

## 2021-05-21 MED ORDER — PENICILLIN G POTASSIUM 20000000 UNITS IJ SOLR
12.0000 10*6.[IU] | Freq: Two times a day (BID) | INTRAVENOUS | Status: DC
  Filled 2021-05-21 (×2): qty 12

## 2021-05-21 NOTE — Progress Notes (Signed)
[facility] for Infectious Disease   Reason for visit: Follow up on prosthetic joint infection  Interval History: Cultures now growing Strep mitis in both cultures sent and penicillin sensitive; remains afebrile since his initial fever on 2/8.  WBC has remained wnl.   No associated rash or diarrhea.  No dental issues or recent dental work.   Day 5 total antibiotics   Physical Exam: Constitutional:  Vitals:   05/20/21 2216 05/21/21 0437  BP: (!) 155/87 (!) 141/72  Pulse: 84 68  Resp: 18 17  Temp: 98.5 F (36.9 C) 98.2 F (36.8 C)  SpO2: 100% 100%   patient appears in NAD Respiratory: Normal respiratory effort; CTA B Cardiovascular: RRR GI: soft, nt, nd  Review of Systems: Constitutional: negative for fevers and chills Gastrointestinal: negative for nausea and diarrhea Integument/breast: negative for rash  Lab Results  Component Value Date   WBC 9.5 05/20/2021   HGB 10.9 (L) 05/20/2021   HCT 34.4 (L) 05/20/2021   MCV 89.6 05/20/2021   PLT 560 (H) 05/20/2021    Lab Results  Component Value Date   CREATININE 0.90 05/21/2021   BUN 14 05/20/2021   NA 135 05/20/2021   K 3.8 05/20/2021   CL 100 05/20/2021   CO2 27 05/20/2021    Lab Results  Component Value Date   ALT 22 05/16/2021   AST 22 05/16/2021   ALKPHOS 90 05/16/2021     Microbiology: Recent Results (from the past 240 hour(s))  Resp Panel by RT-PCR (Flu A&B, Covid) Nasopharyngeal Swab     Status: None   Collection Time: 05/16/21 12:14 PM   Specimen: Nasopharyngeal Swab; Nasopharyngeal(NP) swabs in vial transport medium  Result Value Ref Range Status   SARS Coronavirus 2 by RT PCR NEGATIVE NEGATIVE Final    Comment: (NOTE) SARS-CoV-2 target nucleic acids are NOT DETECTED.  The SARS-CoV-2 RNA is generally detectable in upper respiratory specimens during the acute phase of infection. The lowest concentration of SARS-CoV-2 viral copies this assay can detect is 138 copies/mL. A negative result  does not preclude SARS-Cov-2 infection and should not be used as the sole basis for treatment or other patient management decisions. A negative result may occur with  improper specimen collection/handling, submission of specimen other than nasopharyngeal swab, presence of viral mutation(s) within the areas targeted by this assay, and inadequate number of viral copies(<138 copies/mL). A negative result must be combined with clinical observations, patient history, and epidemiological information. The expected result is Negative.  Fact Sheet for Patients:  EntrepreneurPulse.com.au  Fact Sheet for Healthcare Providers:  IncredibleEmployment.be  This test is no t yet approved or cleared by the [place] FDA and  has been authorized for detection and/or diagnosis of SARS-CoV-2 by FDA under an Emergency Use Authorization (EUA). This EUA will remain  in effect (meaning this test can be used) for the duration of the COVID-19 declaration under Section 564(b)(1) of the Act, 21 U.S.C.section 360bbb-3(b)(1), unless the authorization is terminated  or revoked sooner.       Influenza A by PCR NEGATIVE NEGATIVE Final   Influenza B by PCR NEGATIVE NEGATIVE Final    Comment: (NOTE) The Xpert Xpress SARS-CoV-2/FLU/RSV plus assay is intended as an aid in the diagnosis of influenza from Nasopharyngeal swab specimens and should not be used as a sole basis for treatment. Nasal washings and aspirates are unacceptable for Xpert Xpress SARS-CoV-2/FLU/RSV testing.  Fact Sheet for Patients: EntrepreneurPulse.com.au  Fact Sheet for Healthcare Providers: IncredibleEmployment.be  This  test is not yet approved or cleared by the [place] and has been authorized for detection and/or diagnosis of SARS-CoV-2 by FDA under an Emergency Use Authorization (EUA). This EUA will remain in effect (meaning this test can be used) for  the duration of the COVID-19 declaration under Section 564(b)(1) of the Act, 21 U.S.C. section 360bbb-3(b)(1), unless the authorization is terminated or revoked.  Performed at [facility], [place] [address], [place], [place] [address]   Surgical PCR screen     Status: None   Collection Time: 05/16/21  1:21 PM   Specimen: Nasal Mucosa; Nasal Swab  Result Value Ref Range Status   MRSA, PCR NEGATIVE NEGATIVE Final   Staphylococcus aureus NEGATIVE NEGATIVE Final    Comment: (NOTE) The Xpert SA Assay (FDA approved for NASAL specimens in patients 15 years of age and older), is one component of a comprehensive surveillance program. It is not intended to diagnose infection nor to guide or monitor treatment. Performed at [facility], [place] [address], [place], [place] [address]   Aerobic/Anaerobic Culture w Gram Stain (surgical/deep wound)     Status: None (Preliminary result)   Collection Time: 05/17/21  2:07 PM   Specimen: PATH Cytology Misc. fluid; Body Fluid  Result Value Ref Range Status   Specimen Description KNEE RIGHT SYNOVIUM  Final   Special Requests NONE  Final   Gram Stain   Final    NO SQUAMOUS EPITHELIAL CELLS SEEN MODERATE WBC SEEN NO ORGANISMS SEEN    Culture   Final    RARE STREPTOCOCCUS MITIS/ORALIS NO ANAEROBES ISOLATED; CULTURE IN PROGRESS FOR 5 DAYS    Report Status PENDING  Incomplete   Organism ID, Bacteria STREPTOCOCCUS MITIS/ORALIS  Final      Susceptibility   Streptococcus mitis/oralis - MIC*    TETRACYCLINE 0.5 SENSITIVE Sensitive     VANCOMYCIN 0.5 SENSITIVE Sensitive     CLINDAMYCIN <=0.25 SENSITIVE Sensitive     PENICILLIN Value in next row Sensitive      SENSITIVE<=0.06    CEFTRIAXONE Value in next row Sensitive      SENSITIVE<=0.12Performed at [facility], [place] [address] [place], [place] [address]    * RARE STREPTOCOCCUS MITIS/ORALIS  Aerobic/Anaerobic Culture w Gram Stain (surgical/deep wound)      Status: None (Preliminary result)   Collection Time: 05/17/21  3:34 PM   Specimen: PATH Soft tissue  Result Value Ref Range Status   Specimen Description   Final    KNEE RIGHT Performed at [place] [address], [place], [place] [address]    Special Requests   Final    NONE Performed at [facility], [place] [address], [place], [place] [address]    Gram Stain   Final    NO SQUAMOUS EPITHELIAL CELLS SEEN MODERATE WBC SEEN NO ORGANISMS SEEN Performed at [facility], [place] [address], [place], [place] [address]    Culture   Final    RARE STREPTOCOCCUS MITIS/ORALIS CULTURE REINCUBATED FOR BETTER GROWTH NO ANAEROBES ISOLATED; CULTURE IN PROGRESS FOR 5 DAYS    Report Status PENDING  Incomplete    Impression/Plan:  1. 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 Lyla Glassing - Growth from the operative cultures noted and positive for Strep mitis, penicillin sensitive.  Will transition him to IV penicillin and plan for 6 weeks IV therapy after surgery followed by oral amoxicillin for 3-6 months.  2.  OPAT - will place opat order  3.  Access - picc line ordered.    Diagnosis: Prosthetic joint infection, s/p polyexchange  Culture Result: Strep mitis  Allergies  Allergen Reactions   Ativan [Lorazepam] Other (See Comments)    Increased agitation   Crestor [Rosuvastatin Calcium]     Muscle aches   Vicodin [Hydrocodone-Acetaminophen] Nausea And Vomiting   Atorvastatin     Other reaction(s): Leg cramp    OPAT Orders Discharge antibiotics to be given via PICC line Discharge antibiotics: IV penicillin 24 million units daily continuous infusion Per pharmacy protocol yes Duration: 6 weeks End Date: March 23rd, 2023  [place] Per Protocol: yes  Home health RN for IV administration and teaching; PICC line care and labs.    Labs weekly while on IV antibiotics: _x_ CBC with differential __ BMP _x_  CMP __x CRP _x_ ESR __ Vancomycin trough __ CK  _x_ Please pull PIC at completion of IV antibiotics __ Please leave PIC in place until doctor has seen patient or been notified  Fax weekly labs to [phone]  Clinic Follow Up Appt: 06/08/21  @  4pm with Dr. Linus Salmons Can be in-person or virtual

## 2021-05-21 NOTE — Plan of Care (Signed)
°  Problem: Nutrition: Goal: Adequate nutrition will be maintained Outcome: Progressing   Problem: Safety: Goal: Ability to remain free from injury will improve Outcome: Progressing   Problem: Education: Goal: Knowledge of the prescribed therapeutic regimen will improve Outcome: Progressing   Problem: Pain Management: Goal: Pain level will decrease with appropriate interventions Outcome: Progressing

## 2021-05-21 NOTE — Plan of Care (Signed)
Problem: Health Behavior/Discharge Planning: Goal: Ability to manage health-related needs will improve Outcome: Progressing   Problem: Clinical Measurements: Goal: Cardiovascular complication will be avoided Outcome: Progressing   Problem: Activity: Goal: Risk for activity intolerance will decrease Outcome: [place], RN 05/21/21 4:21 PM

## 2021-05-21 NOTE — Progress Notes (Addendum)
Physical Therapy Treatment Patient Details Name: Micheal Silva MRN: [id number] DOB: 1965/07/11 Today's Date: 05/21/2021   History of Present Illness 56yo male s/p R TKA I&D with poly exchange, PMH: R TKR 03/28/21, opiate abuse, OA, DM, HTN, depression, obesity, sleep apnea    PT Comments    POD # 4 General Comments: AxO x 3 very pleasant and motivated eager to go home today. Pt familiar from recent prior admit.  Assisted OOB to amb in hallway and back to bed awaiting IV Team to place a PICC line.    Recommendations for follow up therapy are one component of a multi-disciplinary discharge planning process, led by the attending physician.  Recommendations may be updated based on patient status, additional functional criteria and insurance authorization.  Follow Up Recommendations  Follow physician's recommendations for discharge plan and follow up therapies     Assistance Recommended at Discharge Intermittent Supervision/Assistance  Patient can return home with the following A little help with bathing/dressing/bathroom;Assistance with cooking/housework;Assist for transportation;Help with stairs or ramp for entrance   Equipment Recommendations  Rolling walker (2 wheels)    Recommendations for Other Services       Precautions / Restrictions Precautions Precautions: Fall;Knee;Other (comment) Precaution Comments: NO knee ROM Required Braces or Orthoses: Knee Immobilizer - Right Knee Immobilizer - Right: On when out of bed or walking Restrictions Weight Bearing Restrictions: No RLE Weight Bearing: Weight bearing as tolerated Other Position/Activity Restrictions: KI in place for WB/ambulation     Mobility  Bed Mobility Overal bed mobility: Modified Independent             General bed mobility comments: no physical assist OOB or back to bed    Transfers Overall transfer level: Needs assistance Equipment used: Rolling walker (2 wheels) Transfers: Sit to/from Stand Sit to  Stand: Modified independent (Device/Increase time)           General transfer comment: good safety cognition and steady    Ambulation/Gait Ambulation/Gait assistance: Modified independent (Device/Increase time) Gait Distance (Feet): 225 Feet Assistive device: Rolling walker (2 wheels) Gait Pattern/deviations: Step-to pattern, Decreased stance time - right, Decreased weight shift to right Gait velocity: decreased     General Gait Details: pt aware to wear KI "all time for walking".  pt self-cues for posture, position from RW and sequence   Stairs             Wheelchair Mobility    Modified Rankin (Stroke Patients Only)       Balance                                            Cognition Arousal/Alertness: Awake/alert Behavior During Therapy: WFL for tasks assessed/performed Overall Cognitive Status: Within Functional Limits for tasks assessed                                 General Comments: AxO x 3 very pleasant and motivated eager to go home today.        Exercises      General Comments        Pertinent Vitals/Pain Pain Assessment Pain Assessment: Faces Faces Pain Scale: Hurts a little bit Pain Location: right knee Pain Descriptors / Indicators: Sore, Tender Pain Intervention(s): Monitored during session, Premedicated before session, Repositioned, Ice applied    Home Living  Prior Function            PT Goals (current goals can now be found in the care plan section) Progress towards PT goals: Progressing toward goals    Frequency    7X/week      PT Plan Current plan remains appropriate    Co-evaluation              AM-PAC PT "6 Clicks" Mobility   Outcome Measure  Help needed turning from your back to your side while in a flat bed without using bedrails?: None Help needed moving from lying on your back to sitting on the side of a flat bed without using  bedrails?: None Help needed moving to and from a bed to a chair (including a wheelchair)?: None Help needed standing up from a chair using your arms (e.g., wheelchair or bedside chair)?: None Help needed to walk in hospital room?: None   6 Click Score: 20    End of Session Equipment Utilized During Treatment: Gait belt Activity Tolerance: Patient tolerated treatment well Patient left: in bed;with call bell/phone within reach;with family/visitor present Nurse Communication: Mobility status PT Visit Diagnosis: Other abnormalities of gait and mobility (R26.89);Difficulty in walking, not elsewhere classified (R26.2)     Time: 1155-2080 PT Time Calculation (min) (ACUTE ONLY): 12 min  Charges:  $Gait Training: 8-22 mins                     {Hindy Perrault  PTA Acute  Rehabilitation Services Pager      [phone] Office      [phone]

## 2021-05-21 NOTE — Progress Notes (Signed)
PHARMACY CONSULT NOTE FOR:  OUTPATIENT  PARENTERAL ANTIBIOTIC THERAPY (OPAT)  Indication: R Knee PJI Regimen: Penicillin 24 million unit daily continuous infusion End date: March 23rd, 2023  IV antibiotic discharge orders are pended. To discharging provider:  please sign these orders via discharge navigator,  Select New Orders & click on the button choice - Manage This Unsigned Work.     Thank you for allowing pharmacy to be a part of this patient's care.  Elpidio Anis 05/21/2021, 11:28 AM

## 2021-05-21 NOTE — Plan of Care (Signed)
Patient discharged home via private vehicle with wife, following visit from [facility] infusion nurse and Dr. Lyla Glassing. Ivan Anchors, RN 05/21/21 5:55 PM

## 2021-05-21 NOTE — Progress Notes (Signed)
Carolynn Sayers with [employer] contacted by this RN and subsequently by Charge Nurse, Danna, to follow-up regarding PICC education and dosing of antibiotic. Pam reports that she is in route to hospital to provided education. She advises that scheduled PCN should not be given at this time (late due to PICC placement procedure). Next dose of antibiotic to be given in patient's home. Ivan Anchors, RN 05/21/21 4:00 PM

## 2021-05-21 NOTE — Progress Notes (Signed)
Peripherally Inserted Central Catheter Placement  The IV Nurse has discussed with the patient and/or persons authorized to consent for the patient, the purpose of this procedure and the potential benefits and risks involved with this procedure.  The benefits include less needle sticks, lab draws from the catheter, and the patient may be discharged home with the catheter. Risks include, but not limited to, infection, bleeding, blood clot (thrombus formation), and puncture of an artery; nerve damage and irregular heartbeat and possibility to perform a PICC exchange if needed/ordered by physician.  Alternatives to this procedure were also discussed.  B[employer] Power PICC patient education guide, fact sheet on infection prevention and patient information card has been provided to patient /or left at bedside.    PICC Placement Documentation  PICC Single Lumen 41/28/78 Right Basilic 39 cm 0 cm (Active)  Indication for Insertion or Continuance of Line Home intravenous therapies (PICC only) 05/21/21 1425  Exposed Catheter (cm) 0 cm 05/21/21 1425  Site Assessment Clean;Dry;Intact 05/21/21 1425  Line Status Flushed;Saline locked;Blood return noted 05/21/21 1425  Dressing Type Transparent;Securing device 05/21/21 1425  Dressing Status Clean;Dry;Intact 05/21/21 1425  Antimicrobial disc in place? Yes 05/21/21 1425  Safety Lock Not Applicable 67/67/20 9470  Dressing Intervention New dressing 05/21/21 1425  Dressing Change Due 05/28/21 05/21/21 [place] 05/21/2021, 2:29 PM

## 2021-05-21 NOTE — Discharge Summary (Signed)
Physician Discharge Summary  Patient ID: KABEER HOAGLAND MRN: [id number] DOB/AGE: 1966/02/23 56 y.o.  Admit date: 05/16/2021 Discharge date: 05/21/2021  Admission Diagnoses:  Surgical site infection  Discharge Diagnoses:  Principal Problem:   Surgical site infection Active Problems:   Hypogonadism in male   Type 2 diabetes mellitus (HCC)   Class 1 obesity   Normocytic anemia   Mild protein malnutrition (HCC)   Past Medical History:  Diagnosis Date   Anxiety    Arthritis    HANDS BIL   BPH (benign prostatic hyperplasia)    Class 1 obesity 05/19/2021   Depression    Diabetes mellitus ([place])    type 2   Difficult intubation    Heart murmur    HTN (hypertension)    Hyperlipidemia    Hypogonadism in male    Insomnia    Obesity    Right-sided chest wall pain 12/25/2015   Sleep apnea    CPAP   Type 2 diabetes mellitus ([place]) 05/19/2021    Surgeries: Procedure(s): IRRIGATION AND DEBRIDEMENT KNEE WITH POLY EXCHANGE on 05/17/2021   Consultants (if any): Treatment Team:  Charlynne Cousins, MD  Infectious disease - Dr. Linus Salmons  Discharged Condition: Improved  Hospital Course: ANIELLO CHRISTOPOULOS is an 56 y.o. male who was admitted 05/16/2021 with a diagnosis of Surgical site infection and went to the operating room on 05/17/2021 and underwent the above named procedures.    He was given perioperative antibiotics:  Anti-infectives (From admission, onward)    Start     Dose/Rate Route Frequency Ordered Stop   05/21/21 1230  penicillin G potassium 12 Million Units in dextrose 5 % 500 mL continuous infusion        12 Million Units 41.7 mL/hr over 12 Hours Intravenous Every 12 hours 05/21/21 1125     05/21/21 0000  penicillin G IVPB        24 Million Units Intravenous Every 24 hours 05/21/21 1146 06/28/21 2359   05/18/21 0400  vancomycin (VANCOREADY) IVPB 1750 mg/350 mL  Status:  Discontinued        1,750 mg 175 mL/hr over 120 Minutes Intravenous Every 24 hours 05/17/21 2034 05/20/21 1751    05/17/21 2100  cefTRIAXone (ROCEPHIN) 2 g in sodium chloride 0.9 % 100 mL IVPB  Status:  Discontinued        2 g 200 mL/hr over 30 Minutes Intravenous Every 24 hours 05/17/21 1946 05/21/21 1125   05/17/21 1630  tobramycin (NEBCIN) powder  Status:  Discontinued          As needed 05/17/21 1630 05/17/21 1946   05/17/21 1630  vancomycin (VANCOCIN) powder  Status:  Discontinued          As needed 05/17/21 1630 05/17/21 1946   05/17/21 1604  ceFAZolin (ANCEF) 2-4 GM/100ML-% IVPB       Note to Pharmacy: Nash Dimmer W: cabinet override      05/17/21 1604 05/17/21 1609   05/17/21 1545  ceFAZolin (ANCEF) IVPB 2g/100 mL premix        2 g 200 mL/hr over 30 Minutes Intravenous On call to O.R. 05/17/21 1534 05/17/21 1617   05/17/21 1545  vancomycin (VANCOCIN) IVPB 1000 mg/200 mL premix        1,000 mg 200 mL/hr over 60 Minutes Intravenous On call to O.R. 05/17/21 1534 05/17/21 1640   05/17/21 1530  vancomycin (VANCOCIN) 1-5 GM/200ML-% IVPB       Note to Pharmacy: Darlys Gales R: cabinet override  05/17/21 1530 05/17/21 1608   05/17/21 1530  ceFAZolin (ANCEF) 3-0.9 GM/100ML-% IVPB       Note to Pharmacy: Darlys Gales R: cabinet override      05/17/21 1530 05/18/21 0344     . WBAT right leg in knee immobilizer. Knee immobilizer while out of bed.  Postoperatively, blood sugars were elevated. Hospitalist consult was obtained, and metformin was started.  Intraop culture grew Streptococcus mitis and antibiotics were changed to IV PCN per infectious disease. PICC was placed. HH IV antibiotics were set up.  He was given sequential compression devices, early ambulation, and ASA for DVT prophylaxis.  He benefited maximally from the hospital stay and there were no complications.    Discharged to home with negative presure incisional dressing and JP drain. Every 4 hours, he should empty the drain, record the drainage, and recharge the drain. Bring the drainage log to follow up appointment.     Recent vital signs:  Vitals:   05/21/21 0437 05/21/21 1136  BP: (!) 141/72 (!) 143/83  Pulse: 68 68  Resp: 17 16  Temp: 98.2 F (36.8 C) 98.3 F (36.8 C)  SpO2: 100% 100%    Recent laboratory studies:  Lab Results  Component Value Date   HGB 10.9 (L) 05/20/2021   HGB 11.3 (L) 05/19/2021   HGB 11.2 (L) 05/18/2021   Lab Results  Component Value Date   WBC 9.5 05/20/2021   PLT 560 (H) 05/20/2021   Lab Results  Component Value Date   INR 1.2 05/16/2021   Lab Results  Component Value Date   NA 135 05/20/2021   K 3.8 05/20/2021   CL 100 05/20/2021   CO2 27 05/20/2021   BUN 14 05/20/2021   CREATININE 0.90 05/21/2021   GLUCOSE 222 (H) 05/20/2021   Recent Results (from the past 240 hour(s))  Resp Panel by RT-PCR (Flu A&B, Covid) Nasopharyngeal Swab     Status: None   Collection Time: 05/16/21 12:14 PM   Specimen: Nasopharyngeal Swab; Nasopharyngeal(NP) swabs in vial transport medium  Result Value Ref Range Status   SARS Coronavirus 2 by RT PCR NEGATIVE NEGATIVE Final    Comment: (NOTE) SARS-CoV-2 target nucleic acids are NOT DETECTED.  The SARS-CoV-2 RNA is generally detectable in upper respiratory specimens during the acute phase of infection. The lowest concentration of SARS-CoV-2 viral copies this assay can detect is 138 copies/mL. A negative result does not preclude SARS-Cov-2 infection and should not be used as the sole basis for treatment or other patient management decisions. A negative result may occur with  improper specimen collection/handling, submission of specimen other than nasopharyngeal swab, presence of viral mutation(s) within the areas targeted by this assay, and inadequate number of viral copies(<138 copies/mL). A negative result must be combined with clinical observations, patient history, and epidemiological information. The expected result is Negative.  Fact Sheet for Patients:  EntrepreneurPulse.com.au  Fact Sheet for  Healthcare Providers:  IncredibleEmployment.be  This test is no t yet approved or cleared by the [place] FDA and  has been authorized for detection and/or diagnosis of SARS-CoV-2 by FDA under an Emergency Use Authorization (EUA). This EUA will remain  in effect (meaning this test can be used) for the duration of the COVID-19 declaration under Section 564(b)(1) of the Act, 21 U.S.C.section 360bbb-3(b)(1), unless the authorization is terminated  or revoked sooner.       Influenza A by PCR NEGATIVE NEGATIVE Final   Influenza B by PCR NEGATIVE NEGATIVE Final  Comment: (NOTE) The Xpert Xpress SARS-CoV-2/FLU/RSV plus assay is intended as an aid in the diagnosis of influenza from Nasopharyngeal swab specimens and should not be used as a sole basis for treatment. Nasal washings and aspirates are unacceptable for Xpert Xpress SARS-CoV-2/FLU/RSV testing.  Fact Sheet for Patients: EntrepreneurPulse.com.au  Fact Sheet for Healthcare Providers: IncredibleEmployment.be  This test is not yet approved or cleared by the [place] FDA and has been authorized for detection and/or diagnosis of SARS-CoV-2 by FDA under an Emergency Use Authorization (EUA). This EUA will remain in effect (meaning this test can be used) for the duration of the COVID-19 declaration under Section 564(b)(1) of the Act, 21 U.S.C. section 360bbb-3(b)(1), unless the authorization is terminated or revoked.  Performed at [facility], [place] [address], [place], [place] [address]   Surgical PCR screen     Status: None   Collection Time: 05/16/21  1:21 PM   Specimen: Nasal Mucosa; Nasal Swab  Result Value Ref Range Status   MRSA, PCR NEGATIVE NEGATIVE Final   Staphylococcus aureus NEGATIVE NEGATIVE Final    Comment: (NOTE) The Xpert SA Assay (FDA approved for NASAL specimens in patients 30 years of age and older), is one component of a  comprehensive surveillance program. It is not intended to diagnose infection nor to guide or monitor treatment. Performed at [facility] [place] [facility] [address] [place], [place] [address]   Aerobic/Anaerobic Culture w Gram Stain (surgical/deep wound)     Status: None (Preliminary result)   Collection Time: 05/17/21  2:07 PM   Specimen: PATH Cytology Misc. fluid; Body Fluid  Result Value Ref Range Status   Specimen Description KNEE RIGHT SYNOVIUM  Final   Special Requests NONE  Final   Gram Stain   Final    NO SQUAMOUS EPITHELIAL CELLS SEEN MODERATE WBC SEEN NO ORGANISMS SEEN    Culture   Final    RARE STREPTOCOCCUS MITIS/ORALIS NO ANAEROBES ISOLATED; CULTURE IN PROGRESS FOR 5 DAYS    Report Status PENDING  Incomplete   Organism ID, Bacteria STREPTOCOCCUS MITIS/ORALIS  Final      Susceptibility   Streptococcus mitis/oralis - MIC*    TETRACYCLINE 0.5 SENSITIVE Sensitive     VANCOMYCIN 0.5 SENSITIVE Sensitive     CLINDAMYCIN <=0.25 SENSITIVE Sensitive     PENICILLIN Value in next row Sensitive      SENSITIVE<=0.06    CEFTRIAXONE Value in next row Sensitive      SENSITIVE<=0.12Performed at [facility], [place] [address] [place], [place] [address]    * RARE STREPTOCOCCUS MITIS/ORALIS  Aerobic/Anaerobic Culture w Gram Stain (surgical/deep wound)     Status: None (Preliminary result)   Collection Time: 05/17/21  3:34 PM   Specimen: PATH Soft tissue  Result Value Ref Range Status   Specimen Description   Final    KNEE RIGHT Performed at [place] [address], [place], [place] [address]    Special Requests   Final    NONE Performed at [facility], [place] [address] [place], [place] [address]    Gram Stain   Final    NO SQUAMOUS EPITHELIAL CELLS SEEN MODERATE WBC SEEN NO ORGANISMS SEEN Performed at [facility], [place] [address], [place],  [address]    Culture   Final    RARE STREPTOCOCCUS  MITIS/ORALIS CULTURE REINCUBATED FOR BETTER GROWTH NO ANAEROBES ISOLATED; CULTURE IN PROGRESS FOR 5 DAYS    Report Status PENDING  Incomplete  WEIGHT BEARING   Weight bearing as tolerated with assist device (walker, cane, etc) as directed, use it as long as suggested by your surgeon or therapist, typically at least 4-6 weeks.   EXERCISES  Results after joint replacement surgery are often greatly improved when you follow the exercise, range of motion and muscle strengthening exercises prescribed by your doctor. Safety measures are also important to protect the joint from further injury. Any time any of these exercises cause you to have increased pain or swelling, decrease what you are doing until you are comfortable again and then slowly increase them. If you have problems or questions, call your caregiver or physical therapist for advice.   Rehabilitation is important following a joint replacement. After just a few days of immobilization, the muscles of the leg can become weakened and shrink (atrophy).  These exercises are designed to build up the tone and strength of the thigh and leg muscles and to improve motion. Often times heat used for twenty to thirty minutes before working out will loosen up your tissues and help with improving the range of motion but do not use heat for the first two weeks following surgery (sometimes heat can increase post-operative swelling).   These exercises can be done on a training (exercise) mat, on the floor, on a table or on a bed. Use whatever works the best and is most comfortable for you.    Use music or television while you are exercising so that the exercises are a pleasant break in your day. This will make your life better with the exercises acting as a break in your routine that you can look forward to.   Perform all exercises about fifteen times, three times per day or as directed.  You should exercise both the operative leg and the other leg as  well.  Exercises include:   Quad Sets - Tighten up the muscle on the front of the thigh (Quad) and hold for 5-10 seconds.   Straight Leg Raises - With your knee straight (if you were given a brace, keep it on), lift the leg to 60 degrees, hold for 3 seconds, and slowly lower the leg.  Perform this exercise against resistance later as your leg gets stronger.  Leg Slides: Lying on your back, slowly slide your foot toward your buttocks, bending your knee up off the floor (only go as far as is comfortable). Then slowly slide your foot back down until your leg is flat on the floor again.  Angel Wings: Lying on your back spread your legs to the side as far apart as you can without causing discomfort.  Hamstring Strength:  Lying on your back, push your heel against the floor with your leg straight by tightening up the muscles of your buttocks.  Repeat, but this time bend your knee to a comfortable angle, and push your heel against the floor.  You may put a pillow under the heel to make it more comfortable if necessary.   A rehabilitation program following joint replacement surgery can speed recovery and prevent re-injury in the future due to weakened muscles. Contact your doctor or a physical therapist for more information on knee rehabilitation.    CONSTIPATION  Constipation is defined medically as fewer than three stools per week and severe constipation as less than one stool per week.  Even if you have a regular bowel pattern at home, your normal regimen is likely to be disrupted due to multiple reasons following surgery.  Combination  of anesthesia, postoperative narcotics, change in appetite and fluid intake all can affect your bowels.   YOU MUST use at least one of the following options; they are listed in order of increasing strength to get the job done.  They are all available over the counter, and you may need to use some, POSSIBLY even all of these options:    Drink plenty of fluids (prune juice  may be helpful) and high fiber foods Colace 100 mg by mouth twice a day  Senokot for constipation as directed and as needed Dulcolax (bisacodyl), take with full glass of water  Miralax (polyethylene glycol) once or twice a day as needed.  If you have tried all these things and are unable to have a bowel movement in the first 3-4 days after surgery call either your surgeon or your primary doctor.    If you experience loose stools or diarrhea, hold the medications until you stool forms back up.  If your symptoms do not get better within 1 week or if they get worse, check with your doctor.  If you experience "the worst abdominal pain ever" or develop nausea or vomiting, please contact the office immediately for further recommendations for treatment.   ITCHING:  If you experience itching with your medications, try taking only a single pain pill, or even half a pain pill at a time.  You can also use Benadryl over the counter for itching or also to help with sleep.   TED HOSE STOCKINGS:  Use stockings on both legs until for at least 2 weeks or as directed by physician office. They may be removed at night for sleeping.  MEDICATIONS:  See your medication summary on the After Visit Summary that nursing will review with you.  You may have some home medications which will be placed on hold until you complete the course of blood thinner medication.  It is important for you to complete the blood thinner medication as prescribed.  PRECAUTIONS:  If you experience chest pain or shortness of breath - call 911 immediately for transfer to the hospital emergency department.   If you develop a fever greater that 101 F, purulent drainage from wound, increased redness or drainage from wound, foul odor from the wound/dressing, or calf pain - CONTACT YOUR SURGEON.                                                   FOLLOW-UP APPOINTMENTS:  If you do not already have a post-op appointment, please call the office for an  appointment to be seen by your surgeon.  Guidelines for how soon to be seen are listed in your After Visit Summary, but are typically between 1-4 weeks after surgery.  OTHER INSTRUCTIONS:   Knee Replacement:  Do not place pillow under knee, focus on keeping the knee straight while resting. CPM instructions: 0-90 degrees, 2 hours in the morning, 2 hours in the afternoon, and 2 hours in the evening. Place foam block, curve side up under heel at all times except when in CPM or when walking.  DO NOT modify, tear, cut, or change the foam block in any way.   MAKE SURE YOU:  Understand these instructions.  Get help right away if you are not doing well or get worse.    Thank you for letting us be a part  of your medical care team.  It is a privilege we respect greatly.  We hope these instructions will help you stay on track for a fast and full recovery!   Diagnostic Studies: DG Knee Right Port  Result Date: 05/16/2021 CLINICAL DATA:  Right knee replacement 7 weeks ago. Infectious/inflammatory process. EXAM: PORTABLE RIGHT KNEE - 1-2 VIEW COMPARISON:  Knee MR dated October 14, 2017 FINDINGS: Status post right knee arthroplasty without evidence of perihardware loosening. There is moderate-to-large joint effusion as well as multiple pockets of gas about the anterior aspect of the knee with marked soft tissue swelling. IMPRESSION: Status post right knee arthroplasty with moderate-to-large suprapatellar joint effusion. Marked skin thickening and subcutaneous soft tissue edema with multiple pockets of gas. Findings concerning for septic arthritis. Orthopedic consultation further management is recommended. Electronically Signed   By: Keane Police D.O.   On: 05/16/2021 20:38   [place] EKG SITE RITE  Result Date: 05/21/2021 If Site Rite image not attached, placement could not be confirmed due to current cardiac rhythm.   Disposition: Discharge disposition: 01-Home or Self Care       Discharge Instructions      [facility] pharmacist to adjust dose for Vancomycin, Aminoglycosides and other anti-infective therapies as requested by physician.   Complete by: As directed    [facility] to provide Cath Flo 62m   Complete by: As directed    Administer for PICC line occlusion and as ordered by physician for other access device issues.   Anaphylaxis Kit: Provided to treat any anaphylactic reaction to the medication being provided to the patient if First Dose or when requested by physician   Complete by: As directed    Epinephrine 172mml vial / amp: Administer 0.42m87m0.42ml82mubcutaneously once for moderate to severe anaphylaxis, nurse to call physician and pharmacy when reaction occurs and call 911 if needed for immediate care   Diphenhydramine 50mg66mIV vial: Administer 25-50mg 31mM PRN for first dose reaction, rash, itching, mild reaction, nurse to call physician and pharmacy when reaction occurs   Sodium Chloride 0.9% NS 500ml I75mdminister if needed for hypovolemic blood pressure drop or as ordered by physician after call to physician with anaphylactic reaction   Call MD / Call 911   Complete by: As directed    If you experience chest pain or shortness of breath, CALL 911 and be transported to the hospital emergency room.  If you develope a fever above 101 F, pus (white drainage) or increased drainage or redness at the wound, or calf pain, call your surgeon's office.   Change dressing on IV access line weekly and PRN   Complete by: As directed    Constipation Prevention   Complete by: As directed    Drink plenty of fluids.  Prune juice may be helpful.  You may use a stool softener, such as Colace (over the counter) 100 mg twice a day.  Use MiraLax (over the counter) for constipation as needed.   Diet - low sodium heart healthy   Complete by: As directed    Discharge instructions   Complete by: As directed    Please see detailed discharge instructions   Driving restrictions    Complete by: As directed    No driving for 12 weeks   Flush IV access with Sodium Chloride 0.9% and Heparin 10 units/ml or 100 units/ml   Complete by: As directed    Home infusion instructions - [facility]   Complete by:  As directed    Instructions: Flush IV access with Sodium Chloride 0.9% and Heparin 10units/ml or 100units/ml   Change dressing on IV access line: Weekly and PRN   Instructions Cath Flo 83m: Administer for PICC Line occlusion and as ordered by physician for other access device   [facility] pharmacist to adjust dose for: Vancomycin, Aminoglycosides and other anti-infective therapies as requested by physician   Increase activity slowly as tolerated   Complete by: As directed    Lifting restrictions   Complete by: As directed    No lifting for 12 weeks   Method of administration may be changed at the discretion of home infusion pharmacist based upon assessment of the patient and/or caregivers ability to self-administer the medication ordered   Complete by: As directed    Post-operative opioid taper instructions:   Complete by: As directed    POST-OPERATIVE OPIOID TAPER INSTRUCTIONS: It is important to wean off of your opioid medication as soon as possible. If you do not need pain medication after your surgery it is ok to stop day one. Opioids include: Codeine, Hydrocodone(Norco, Vicodin), Oxycodone(Percocet, oxycontin) and hydromorphone amongst others.  Long term and even short term use of opiods can cause: Increased pain response Dependence Constipation Depression Respiratory depression And more.  Withdrawal symptoms can include Flu like symptoms Nausea, vomiting And more Techniques to manage these symptoms Hydrate well Eat regular healthy meals Stay active Use relaxation techniques(deep breathing, meditating, yoga) Do Not substitute Alcohol to help with tapering If you have been on opioids for less than two weeks and do not have pain  than it is ok to stop all together.  Plan to wean off of opioids This plan should start within one week post op of your joint replacement. Maintain the same interval or time between taking each dose and first decrease the dose.  Cut the total daily intake of opioids by one tablet each day Next start to increase the time between doses. The last dose that should be eliminated is the evening dose.          OPAT Orders Discharge antibiotics to be given via PICC line Discharge antibiotics: IV penicillin 24 million units daily continuous infusion Per pharmacy protocol yes Duration: 6 weeks End Date: March 23rd, 2023   [place]: yes   Home health RN for IV administration and teaching; PICC line care and labs.     Labs weekly while on IV antibiotics: _x_ CBC with differential __ BMP _x_ CMP __x CRP _x_ ESR __ Vancomycin trough __ CK   _x_ Please pull PIC at completion of IV antibiotics __ Please leave PIC in place until doctor has seen patient or been notified   Fax weekly labs to [phone]  Clinic Follow Up Appt: 06/08/21   @  4pm with Dr. CLinus SalmonsCan be in-person or virtual   Follow-up Information     Jeanmarc Viernes, BAaron Edelman MD Follow up on 05/28/2021.   Specialty: Orthopedic Surgery Why: for iA [place] Place[place] Contact information: [address] [place] [phone]         [facility]   Specialty: Home Health Services Why: BAlvis Lemmingswill provide nursing to assist with labs and PICC line maintenance. Contact information: [phone]         [facility] Follow up.   Why: [facility] will provide home IV antibiotics.  Signed: Hilton Cork Sasan Wilkie 05/21/2021, 1:40 PM

## 2021-05-21 NOTE — TOC Transition Note (Signed)
Transition of Care [facility]) - CM/SW Discharge Note  Patient Details  Name: Micheal Silva MRN: [id number] Date of Birth: 1965-04-25  [facility] CM/SW Contact:  Sherie Don, LCSW Phone Number: 05/21/2021, 1:13 PM  Clinical Narrative: [name] has OPAT orders. CSW spoke with patient to update him regarding discharge. TOC signing off.  Final next level of care: [place] Barriers to Discharge: Barriers Resolved  Patient Goals and CMS Choice Patient states their goals for this hospitalization and ongoing recovery are:: Discharge home with IV antibiotics and [facility] CMS Medicare.gov Compare Post Acute Care list provided to:: Patient Choice offered to / list presented to : Patient  Discharge Plan and Services In-house Referral: Clinical Social Work Post Acute Care Choice: Home Health          DME Arranged: N/A DME Agency: NA HH Arranged: IV Antibiotics HH Agency: [facility] Date HH Agency Contacted: 05/18/21 Representative spoke with at [place]: Valeria  Readmission Risk Interventions No flowsheet data found.

## 2021-05-21 NOTE — Progress Notes (Signed)
Subjective:  Patient reports pain as mild to moderate.  Denies N/V/CP/SOB. No c/o.  Objective:   VITALS:   Vitals:   05/20/21 2216 05/21/21 0437 05/21/21 1136 05/21/21 1351  BP: (!) 155/87 (!) 141/72 (!) 143/83 137/82  Pulse: 84 68 68 93  Resp: 18 17 16 16   Temp: 98.5 F (36.9 C) 98.2 F (36.8 C) 98.3 F (36.8 C) 99 F (37.2 C)  TempSrc:  Oral Oral   SpO2: 100% 100% 100% 98%  Weight:      Height:       JP: NR  NAD ABD soft Sensation intact distally Intact pulses distally Dorsiflexion/Plantar flexion intact Incision: dressing C/D/I Compartment soft iVAC intact without leak JP ss  Lab Results  Component Value Date   WBC 9.5 05/20/2021   HGB 10.9 (L) 05/20/2021   HCT 34.4 (L) 05/20/2021   MCV 89.6 05/20/2021   PLT 560 (H) 05/20/2021   BMET    Component Value Date/Time   NA 135 05/20/2021 0329   K 3.8 05/20/2021 0329   CL 100 05/20/2021 0329   CO2 27 05/20/2021 0329   GLUCOSE 222 (H) 05/20/2021 0329   BUN 14 05/20/2021 0329   CREATININE 0.90 05/21/2021 0324   CALCIUM 8.5 (L) 05/20/2021 0329   GFRNONAA >60 05/21/2021 0324    Recent Results (from the past 240 hour(s))  Resp Panel by RT-PCR (Flu A&B, Covid) Nasopharyngeal Swab     Status: None   Collection Time: 05/16/21 12:14 PM   Specimen: Nasopharyngeal Swab; Nasopharyngeal(NP) swabs in vial transport medium  Result Value Ref Range Status   SARS Coronavirus 2 by RT PCR NEGATIVE NEGATIVE Final    Comment: (NOTE) SARS-CoV-2 target nucleic acids are NOT DETECTED.  The SARS-CoV-2 RNA is generally detectable in upper respiratory specimens during the acute phase of infection. The lowest concentration of SARS-CoV-2 viral copies this assay can detect is 138 copies/mL. A negative result does not preclude SARS-Cov-2 infection and should not be used as the sole basis for treatment or other patient management decisions. A negative result may occur with  improper specimen collection/handling, submission of  specimen other than nasopharyngeal swab, presence of viral mutation(s) within the areas targeted by this assay, and inadequate number of viral copies(<138 copies/mL). A negative result must be combined with clinical observations, patient history, and epidemiological information. The expected result is Negative.  Fact Sheet for Patients:  EntrepreneurPulse.com.au  Fact Sheet for Healthcare Providers:  IncredibleEmployment.be  This test is no t yet approved or cleared by the [place] FDA and  has been authorized for detection and/or diagnosis of SARS-CoV-2 by FDA under an Emergency Use Authorization (EUA). This EUA will remain  in effect (meaning this test can be used) for the duration of the COVID-19 declaration under Section 564(b)(1) of the Act, 21 U.S.C.section 360bbb-3(b)(1), unless the authorization is terminated  or revoked sooner.       Influenza A by PCR NEGATIVE NEGATIVE Final   Influenza B by PCR NEGATIVE NEGATIVE Final    Comment: (NOTE) The Xpert Xpress SARS-CoV-2/FLU/RSV plus assay is intended as an aid in the diagnosis of influenza from Nasopharyngeal swab specimens and should not be used as a sole basis for treatment. Nasal washings and aspirates are unacceptable for Xpert Xpress SARS-CoV-2/FLU/RSV testing.  Fact Sheet for Patients: EntrepreneurPulse.com.au  Fact Sheet for Healthcare Providers: IncredibleEmployment.be  This test is not yet approved or cleared by the [place] FDA and has been authorized for detection and/or diagnosis of  SARS-CoV-2 by FDA under an Emergency Use Authorization (EUA). This EUA will remain in effect (meaning this test can be used) for the duration of the COVID-19 declaration under Section 564(b)(1) of the Act, 21 U.S.C. section 360bbb-3(b)(1), unless the authorization is terminated or revoked.  Performed at [facility], [place]  [address] [place], [place] [address]   Surgical PCR screen     Status: None   Collection Time: 05/16/21  1:21 PM   Specimen: Nasal Mucosa; Nasal Swab  Result Value Ref Range Status   MRSA, PCR NEGATIVE NEGATIVE Final   Staphylococcus aureus NEGATIVE NEGATIVE Final    Comment: (NOTE) The Xpert SA Assay (FDA approved for NASAL specimens in patients 23 years of age and older), is one component of a comprehensive surveillance program. It is not intended to diagnose infection nor to guide or monitor treatment. Performed at [facility], [place] [address] [place], [place] [address]   Aerobic/Anaerobic Culture w Gram Stain (surgical/deep wound)     Status: None (Preliminary result)   Collection Time: 05/17/21  2:07 PM   Specimen: PATH Cytology Misc. fluid; Body Fluid  Result Value Ref Range Status   Specimen Description KNEE RIGHT SYNOVIUM  Final   Special Requests NONE  Final   Gram Stain   Final    NO SQUAMOUS EPITHELIAL CELLS SEEN MODERATE WBC SEEN NO ORGANISMS SEEN    Culture   Final    RARE STREPTOCOCCUS MITIS/ORALIS NO ANAEROBES ISOLATED; CULTURE IN PROGRESS FOR 5 DAYS    Report Status PENDING  Incomplete   Organism ID, Bacteria STREPTOCOCCUS MITIS/ORALIS  Final      Susceptibility   Streptococcus mitis/oralis - MIC*    TETRACYCLINE 0.5 SENSITIVE Sensitive     VANCOMYCIN 0.5 SENSITIVE Sensitive     CLINDAMYCIN <=0.25 SENSITIVE Sensitive     PENICILLIN Value in next row Sensitive      SENSITIVE<=0.06    CEFTRIAXONE Value in next row Sensitive      SENSITIVE<=0.12Performed at [facility], [place] [address], [place], [place] [address]    * RARE STREPTOCOCCUS MITIS/ORALIS  Aerobic/Anaerobic Culture w Gram Stain (surgical/deep wound)     Status: None (Preliminary result)   Collection Time: 05/17/21  3:34 PM   Specimen: PATH Soft tissue  Result Value Ref Range Status   Specimen Description   Final    KNEE RIGHT Performed at [facility] [address], [place], [place] [address]    Special Requests   Final    NONE Performed at [facility], [place] [address], [place], [place] [address]    Gram Stain   Final    NO SQUAMOUS EPITHELIAL CELLS SEEN MODERATE WBC SEEN NO ORGANISMS SEEN Performed at [facility], [place] [address], [place], [place] [address]    Culture   Final    RARE STREPTOCOCCUS MITIS/ORALIS CULTURE REINCUBATED FOR BETTER GROWTH NO ANAEROBES ISOLATED; CULTURE IN PROGRESS FOR 5 DAYS    Report Status PENDING  Incomplete      Assessment/Plan: 4 Days Post-Op   Principal Problem:   Surgical site infection Active Problems:   Hypogonadism in male   Type 2 diabetes mellitus (HCC)   Class 1 obesity   Normocytic anemia   Mild protein malnutrition (HCC)   WBAT RLE with walker, KI when OOB DVT ppx:  ASA 81 mg PO BID, SCDs, TEDS PO pain control PT/OT Postop Strep mitis R knee PJI: ID rec 6 weeks  IV PCN, Cont iVAC, Cont JP drain Thrombocytopenia: like acute phase reactive, monitor  Dispo: D/C home when home IV abx set up, Retain JP drain, RN to switch house VAC suction unit to portable Prevena unit upon d/c, call to schedule a f/u appointment within 7 days of discharge for removal of negative pressure dressing   Hilton Cork Ia Leeb 05/21/2021, 4:40 PM   Rod Can, MD [phone] [facility]   [place] [address] [place], [place], [place] [address] Phone: [phone] [email or web address]   [employer]

## 2021-05-23 LAB — AEROBIC/ANAEROBIC CULTURE W GRAM STAIN (SURGICAL/DEEP WOUND)
Gram Stain: NONE SEEN
Gram Stain: NONE SEEN

## 2021-06-08 ENCOUNTER — Other Ambulatory Visit: Payer: Self-pay

## 2021-06-08 ENCOUNTER — Ambulatory Visit (INDEPENDENT_AMBULATORY_CARE_PROVIDER_SITE_OTHER): Payer: BC Managed Care – PPO | Admitting: Internal Medicine

## 2021-06-08 ENCOUNTER — Encounter: Payer: Self-pay | Admitting: Internal Medicine

## 2021-06-08 DIAGNOSIS — Z5181 Encounter for therapeutic drug level monitoring: Secondary | ICD-10-CM

## 2021-06-08 DIAGNOSIS — T8453XA Infection and inflammatory reaction due to internal right knee prosthesis, initial encounter: Secondary | ICD-10-CM | POA: Insufficient documentation

## 2021-06-08 DIAGNOSIS — Z452 Encounter for adjustment and management of vascular access device: Secondary | ICD-10-CM | POA: Diagnosis not present

## 2021-06-08 DIAGNOSIS — T8453XD Infection and inflammatory reaction due to internal right knee prosthesis, subsequent encounter: Secondary | ICD-10-CM | POA: Diagnosis not present

## 2021-06-08 MED ORDER — AMOXICILLIN 500 MG PO CAPS: 1000.0000 mg | ORAL_CAPSULE | Freq: Three times a day (TID) | ORAL | 1 refills | Status: DC

## 2021-06-08 NOTE — Assessment & Plan Note (Signed)
Creat, WBC have been stable, normal.  Labs reviewed with the patient today ?

## 2021-06-08 NOTE — Assessment & Plan Note (Signed)
No issues with the picc line, working well.  Plan to remove by home health after 06/28/21 last dose or sooner if indicated.  ?

## 2021-06-08 NOTE — Progress Notes (Signed)
? ?  Subjective:  ? ? Patient ID: JOBE MUTCH, male    DOB: March 05, 1966, 56 y.o.   MRN: [id number] ? ?HPI ?Here for hsfu ?He has a history of multiple interventions in his right knee including a partial medial meniscectomy and a right total knee arthroplasty done in December 2022 by Dr. 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 He underwent I and D with polyexchange on 05/17/21 and culture grew out Streptococcus mitis, penicillin sensitive.  He was discharged on IV penicillin for 6 weeks through March 23rd and continues on this.  He has had no significant issues with the antibiotics including no rash or diarrhea.   ?ESR stable at 72/72.  BAseline 98.   ?No fever.  Asking about probiotics. Here with his wife.  ? ? ?Review of Systems  ?Constitutional:  Negative for fatigue.  ?Gastrointestinal:  Negative for diarrhea.  ?Skin:  Negative for rash.  ? ?   ?Objective:  ? Physical Exam ?Eyes:  ?   General: No scleral icterus. ?Pulmonary:  ?   Effort: Pulmonary effort is normal.  ?Musculoskeletal:  ?   Comments: Picc line in right arm, c/d/i  ?Skin: ?   Findings: No rash.  ?Neurological:  ?   Mental Status: He is alert.  ?Psychiatric:     ?   Mood and Affect: Mood normal.  ? ?SH: no tobacco ? ? ? ?   ?Assessment & Plan:  ? ? ?

## 2021-06-08 NOTE — Assessment & Plan Note (Signed)
He is doing well on treatment with improved pain and swelling since surgery.  He is tolerating the antibiotics well.  No concerns.  ESR has declined from his baseline.   ?The plan is to continue with IV penicillin through March 23rd though he received notification from his insurance that it might just be covered through 06/18/21 and he will check with them. ?I think it is reasonable to transition him to oral amoxicillin 1 gram 3 times a day for amonth if he finishes early on 3/13 with the IV and then will transition him to 500 mg three times a day after that.   ?He will follow up in 1 month.  ?

## 2021-06-11 ENCOUNTER — Encounter: Payer: Self-pay | Admitting: Internal Medicine

## 2021-06-13 ENCOUNTER — Encounter (HOSPITAL_COMMUNITY): Payer: Self-pay | Admitting: Radiology

## 2021-07-10 ENCOUNTER — Ambulatory Visit (INDEPENDENT_AMBULATORY_CARE_PROVIDER_SITE_OTHER): Payer: BC Managed Care – PPO | Admitting: Internal Medicine

## 2021-07-10 ENCOUNTER — Other Ambulatory Visit: Payer: Self-pay

## 2021-07-10 ENCOUNTER — Encounter: Payer: Self-pay | Admitting: Internal Medicine

## 2021-07-10 VITALS — BP 127/76 | HR 80 | Temp 98.0°F | Ht 68.0 in | Wt 187.9 lb

## 2021-07-10 DIAGNOSIS — T8453XD Infection and inflammatory reaction due to internal right knee prosthesis, subsequent encounter: Secondary | ICD-10-CM

## 2021-07-10 DIAGNOSIS — E1169 Type 2 diabetes mellitus with other specified complication: Secondary | ICD-10-CM | POA: Diagnosis not present

## 2021-07-10 DIAGNOSIS — Z452 Encounter for adjustment and management of vascular access device: Secondary | ICD-10-CM | POA: Diagnosis not present

## 2021-07-10 MED ORDER — AMOXICILLIN 500 MG PO CAPS
500.0000 mg | ORAL_CAPSULE | Freq: Three times a day (TID) | ORAL | 1 refills | Status: DC
Start: 2021-07-29 — End: 2021-09-07

## 2021-07-10 NOTE — Assessment & Plan Note (Signed)
picc removed by home health without any issues reported.  Resolved.  ?

## 2021-07-10 NOTE — Assessment & Plan Note (Signed)
Healing well and no concerns cliically.  Last ESR 51 and CRP 12, reassuring.  Will recheck today and he will return in 2 months to recheck again and consider stopping amoxicillin after 3 months around June 23rd.   ?

## 2021-07-10 NOTE — Progress Notes (Signed)
? ?  Subjective:  ? ? Patient ID: Micheal Silva, male    DOB: April 13, 1965, 56 y.o.   MRN: [id number] ? ?HPI ?Here for hsfu ?He has a history of multiple interventions in his right knee including a partial medial meniscectomy and a right total knee arthroplasty done in December 2022 by Dr. 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 He underwent I and D with polyexchange on 05/17/21 and culture grew out Streptococcus mitis, penicillin sensitive.  He was discharged on IV penicillin for 6 weeks through March 23rd and now completed.  He has started on oral amoxicillin high dose and plan for this for 3 months through 09/28/21.  No issues with rash or diarrhea.  ?Continuing with physical therapy. ?Still with decreased energy, different from his baseline.   ? ? ?Review of Systems  ?Constitutional:  Negative for fatigue.  ?Gastrointestinal:  Negative for diarrhea.  ?Skin:  Negative for rash.  ? ?   ?Objective:  ? Physical Exam ?Eyes:  ?   General: No scleral icterus. ?Pulmonary:  ?   Effort: Pulmonary effort is normal.  ?Musculoskeletal:  ?   Comments: Right knee with well-healed incision scar, minimal warmth, no erythema, no tenderness  ?Skin: ?   Findings: No rash.  ?Neurological:  ?   Mental Status: He is alert.  ?Psychiatric:     ?   Mood and Affect: Mood normal.  ? ?SH: no tobacco ? ? ? ?   ?Assessment & Plan:  ? ? ?

## 2021-07-10 NOTE — Assessment & Plan Note (Signed)
On metformin and doing better with sugars.   ?

## 2021-07-11 LAB — C-REACTIVE PROTEIN: CRP: 6.4 mg/L (ref <8.0)

## 2021-07-11 LAB — SEDIMENTATION RATE: Sed Rate: 19 mm/h (ref 0–20)

## 2021-08-27 ENCOUNTER — Encounter: Payer: Self-pay | Admitting: Internal Medicine

## 2021-09-07 ENCOUNTER — Ambulatory Visit (INDEPENDENT_AMBULATORY_CARE_PROVIDER_SITE_OTHER): Payer: BC Managed Care – PPO | Admitting: Internal Medicine

## 2021-09-07 ENCOUNTER — Other Ambulatory Visit: Payer: Self-pay

## 2021-09-07 ENCOUNTER — Encounter: Payer: Self-pay | Admitting: Internal Medicine

## 2021-09-07 VITALS — BP 127/75 | HR 61 | Temp 97.9°F | Ht 68.0 in | Wt 197.0 lb

## 2021-09-07 DIAGNOSIS — Z5181 Encounter for therapeutic drug level monitoring: Secondary | ICD-10-CM

## 2021-09-07 DIAGNOSIS — T8453XD Infection and inflammatory reaction due to internal right knee prosthesis, subsequent encounter: Secondary | ICD-10-CM | POA: Diagnosis not present

## 2021-09-07 DIAGNOSIS — T887XXA Unspecified adverse effect of drug or medicament, initial encounter: Secondary | ICD-10-CM

## 2021-09-07 MED ORDER — AMOXICILLIN 500 MG PO CAPS: 500.0000 mg | ORAL_CAPSULE | Freq: Three times a day (TID) | ORAL | 1 refills | Status: DC

## 2021-09-07 NOTE — Patient Instructions (Signed)
Continue on the amoxicillin through the end of July (about July 30) then stop.

## 2021-09-07 NOTE — Assessment & Plan Note (Signed)
Overall appears clinically to be healing well.  Concerns for recent swelling noted but clinically has resolved. Inflammatory markers now wnl.  Overall progressing as expected.  I do not see any indication for continuation of amoxicillin for 1 year.  I discussed that relapse is possible and the test of cure will be eventually to stop the antibiotics and follow clinically.   I discussed that there is no evidence of continuation of antibiotics for 1 year vs 3-6 months.   I will though have him continue another month through July since he is tolerating well and will recheck the inflammatory markers today.  He will then follow up with me in August and can recheck ESR and CRP then (and will check today).

## 2021-09-07 NOTE — Assessment & Plan Note (Signed)
Last bmp ok, will recheck today.

## 2021-09-07 NOTE — Assessment & Plan Note (Signed)
Ongoing fatigue and low energy typical of antibiotics and encouraged him to keep as acitve as possible.

## 2021-09-07 NOTE — Progress Notes (Signed)
   Subjective:    Patient ID: Micheal Silva, male    DOB: January 29, 1966, 56 y.o.   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 He underwent I and D with polyexchange on 05/17/21 and culture grew out Streptococcus mitis, penicillin sensitive.  He was discharged on IV penicillin for 6 weeks through March 23rd and now completed.  He has started on oral amoxicillin high dose and plan for this for at least 3 months through 09/28/21.  He was changed to amoxicillin 500 mg three times a day with some ongoing fatigue with the amoxicillin. He developed some swelling of the calf and leg and saw Dr. Lyla Glassing who did not find any new concerns for infection.  He had returned to work at that time.  Dr. Lyla Glassing did increase his amoxicillin back to 1000 mg three times a day and tolerating this now with plan to continue for 3 weeks.  Knee is better since the increase but also since starting a compression sock.  No fever, no chills.  He tells me Dr. Lyla Glassing wants him to be on amoxicillin for 1 year.     Review of Systems  Constitutional:  Negative for chills and fever.  Gastrointestinal:  Negative for diarrhea.  Skin:  Negative for rash.      Objective:   Physical Exam Eyes:     General: No scleral icterus. Pulmonary:     Effort: Pulmonary effort is normal.  Musculoskeletal:     Comments: Right knee with no warmth, no significant swelling  Skin:    Findings: No rash.  Neurological:     Mental Status: He is alert.  Psychiatric:        Mood and Affect: Mood normal.   SH: no tobacco       Assessment & Plan:

## 2021-09-08 LAB — BASIC METABOLIC PANEL
BUN: 15 mg/dL (ref 7–25)
CO2: 29 mmol/L (ref 20–32)
Calcium: 9.7 mg/dL (ref 8.6–10.3)
Chloride: 104 mmol/L (ref 98–110)
Creat: 1.01 mg/dL (ref 0.70–1.30)
Glucose, Bld: 91 mg/dL (ref 65–99)
Potassium: 4.3 mmol/L (ref 3.5–5.3)
Sodium: 140 mmol/L (ref 135–146)

## 2021-09-08 LAB — SEDIMENTATION RATE: Sed Rate: 2 mm/h (ref 0–20)

## 2021-09-08 LAB — C-REACTIVE PROTEIN: CRP: 1.3 mg/L (ref <8.0)

## 2021-09-10 ENCOUNTER — Encounter: Payer: Self-pay | Admitting: Internal Medicine

## 2021-09-13 ENCOUNTER — Other Ambulatory Visit: Payer: Self-pay | Admitting: Internal Medicine

## 2021-09-13 MED ORDER — AMOXICILLIN 500 MG PO CAPS: 1000.0000 mg | ORAL_CAPSULE | Freq: Three times a day (TID) | ORAL | 1 refills | Status: DC

## 2021-11-09 ENCOUNTER — Ambulatory Visit (INDEPENDENT_AMBULATORY_CARE_PROVIDER_SITE_OTHER): Payer: BC Managed Care – PPO | Admitting: Internal Medicine

## 2021-11-09 ENCOUNTER — Encounter: Payer: Self-pay | Admitting: Internal Medicine

## 2021-11-09 ENCOUNTER — Other Ambulatory Visit: Payer: Self-pay

## 2021-11-09 VITALS — BP 180/80 | HR 65 | Temp 97.9°F | Ht 68.0 in | Wt 207.0 lb

## 2021-11-09 DIAGNOSIS — T8453XD Infection and inflammatory reaction due to internal right knee prosthesis, subsequent encounter: Secondary | ICD-10-CM | POA: Diagnosis not present

## 2021-11-09 MED ORDER — AMOXICILLIN 500 MG PO CAPS: 500.0000 mg | ORAL_CAPSULE | Freq: Two times a day (BID) | ORAL | 1 refills | Status: DC

## 2021-11-09 NOTE — Progress Notes (Signed)
   Subjective:    Patient ID: Micheal Silva, male    DOB: July 11, 1965, 56 y.o.   MRN: [id number]  HPI Here for hsfu He has a history of multiple interventions in his right knee including a partial medial meniscectomy and a right total knee arthroplasty done in December 2022 by Dr. 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 He underwent I and D with polyexchange on 05/17/21 and culture grew out Streptococcus mitis, penicillin sensitive.  He was discharged on IV penicillin for 6 weeks through March 23rd and now completed.  He has started on oral amoxicillin high dose and plan for this for at least 3 months through 09/28/21.  He was changed to amoxicillin 500 mg three times a day with some ongoing fatigue with the amoxicillin. He developed some swelling of the calf and leg and saw Dr. Lyla Glassing who did not find any new concerns for infection.  He had returned to work at that time.  Dr. Lyla Glassing did increase his amoxicillin back to 1000 mg three times a day and tolerating this now with plan to continue for 3 weeks through the end of July.  He is here now and has continued with his amoxicillin.  He continues to have some fatigue but tolerating overall well.  His knee is with some mild, typical warmth and some pain but pretty stable overall.  He continues to follow with Dr. Lyla Glassing who is advocating for antibiotics for 1 year, per the patients report.  He is having no rash or diarrhea.     Review of Systems  Constitutional:  Negative for fever.  Gastrointestinal:  Negative for diarrhea.  Skin:  Negative for rash.       Objective:   Physical Exam Eyes:     General: No scleral icterus. Pulmonary:     Effort: Pulmonary effort is normal.  Musculoskeletal:     Comments: Right knee with no warmth, no significant swelling  Skin:    Findings: No rash.  Neurological:     Mental Status: He is alert.  Psychiatric:        Mood and Affect: Mood normal.    SH: no tobacco        Assessment & Plan:

## 2021-11-09 NOTE — Assessment & Plan Note (Signed)
He continues to do well with his knee and tolerating the amoxicillin well with no concerns.  I again discussed with him the types of antibiotic suppression following appropriate IV therapy and the indications for 3-6 months, 12 months or lifetime suppression.   I discussed that suppression for 12 months vs 3-6 months generally have not shown any difference in relapse rates but certainly a 6 month suppressive course is reasonable.  After discussion he has opted to continue for another 3 months, which I think is reasonable, then will stop and will have him return and recheck ESR and CRP and see how his knee is doing.    I have personally spent 40 minutes involved in face-to-face and non-face-to-face activities for this patient on the day of the visit. Professional time spent includes the following activities: Preparing to see the patient (review of tests), Obtaining and/or reviewing separately obtained history (admission/discharge record), Performing a medically appropriate examination and/or evaluation , Ordering medications/tests/procedures, referring and communicating with other health care professionals, Documenting clinical information in the EMR, Independently interpreting results (not separately reported), Communicating results to the patient/family/caregiver, Counseling and educating the patient/family/caregiver and Care coordination (not separately reported).

## 2021-11-10 LAB — SEDIMENTATION RATE: Sed Rate: 2 mm/h (ref 0–20)

## 2021-11-10 LAB — C-REACTIVE PROTEIN: CRP: 1.9 mg/L (ref <8.0)

## 2022-02-13 ENCOUNTER — Encounter: Payer: Self-pay | Admitting: Internal Medicine

## 2022-02-13 ENCOUNTER — Ambulatory Visit (INDEPENDENT_AMBULATORY_CARE_PROVIDER_SITE_OTHER): Payer: BC Managed Care – PPO | Admitting: Internal Medicine

## 2022-02-13 ENCOUNTER — Other Ambulatory Visit: Payer: Self-pay

## 2022-02-13 VITALS — BP 142/81 | HR 81 | Temp 97.9°F | Ht 68.0 in | Wt 209.0 lb

## 2022-02-13 DIAGNOSIS — R5383 Other fatigue: Secondary | ICD-10-CM | POA: Diagnosis not present

## 2022-02-13 DIAGNOSIS — T8453XD Infection and inflammatory reaction due to internal right knee prosthesis, subsequent encounter: Secondary | ICD-10-CM

## 2022-02-13 NOTE — Assessment & Plan Note (Signed)
He clinically is doing well now with no signs of recurrence of infection off antibiotics now one month.  At this point, he is out of the typical window for relapse and doing well.  I will check inflammatory markers for completeness but with his clinical stability, no further antibiotics indicated at this time.   He can follow up as needed

## 2022-02-13 NOTE — Assessment & Plan Note (Signed)
He is doing better now off of antibiotics and encouraged increasing his activity as allowed by Dr. Lyla Glassing.

## 2022-02-13 NOTE — Progress Notes (Signed)
   Subjective:    Patient ID: MARKEEM NOREEN, male    DOB: 08/16/1965, 56 y.o.   MRN: [id number]  HPI Shanon Brow is here for follow up of right prosthetic knee infection.  He has had multiple interventions in hs knee including a right total knee arthroplasty done in December 2022 by Dr. Theda Sers and subsequent infection s/p I and D with polyexchange.  Culture with Strep mitis and completes 6 weeks of IV penicillin followed by oral amoxicillin for 6 months.  He is here now 1 month off of antibiotics.  No changes, feels well.  Overall less fatigue off the antibiotics.  Knee stable with some swelling that remits with rest.     Review of Systems  Constitutional:  Negative for chills and fever.  Gastrointestinal:  Negative for diarrhea.       Objective:   Physical Exam Musculoskeletal:     Comments: No right knee effusion  Neurological:     Mental Status: He is alert.           Assessment & Plan:

## 2022-02-14 LAB — C-REACTIVE PROTEIN: CRP: 1.2 mg/L (ref <8.0)

## 2022-02-14 LAB — SEDIMENTATION RATE: Sed Rate: 2 mm/h (ref 0–20)
